# Patient Record
Sex: Female | Born: 1960 | Race: White | Hispanic: No | Smoking: Never smoker
Health system: Southern US, Community
[De-identification: ages and names within clinical notes are randomized; demographics above are authoritative.]

## PROBLEM LIST (undated history)

## (undated) DIAGNOSIS — F32A Depression, unspecified: Secondary | ICD-10-CM

## (undated) DIAGNOSIS — J45909 Unspecified asthma, uncomplicated: Secondary | ICD-10-CM

## (undated) DIAGNOSIS — F329 Major depressive disorder, single episode, unspecified: Secondary | ICD-10-CM

## (undated) DIAGNOSIS — G43909 Migraine, unspecified, not intractable, without status migrainosus: Secondary | ICD-10-CM

## (undated) DIAGNOSIS — I10 Essential (primary) hypertension: Secondary | ICD-10-CM

## (undated) DIAGNOSIS — D649 Anemia, unspecified: Secondary | ICD-10-CM

## (undated) DIAGNOSIS — E119 Type 2 diabetes mellitus without complications: Secondary | ICD-10-CM

## (undated) DIAGNOSIS — E039 Hypothyroidism, unspecified: Secondary | ICD-10-CM

## (undated) DIAGNOSIS — R609 Edema, unspecified: Secondary | ICD-10-CM

## (undated) DIAGNOSIS — E785 Hyperlipidemia, unspecified: Secondary | ICD-10-CM

## (undated) DIAGNOSIS — R519 Headache, unspecified: Secondary | ICD-10-CM

## (undated) DIAGNOSIS — F419 Anxiety disorder, unspecified: Secondary | ICD-10-CM

## (undated) DIAGNOSIS — E559 Vitamin D deficiency, unspecified: Secondary | ICD-10-CM

## (undated) DIAGNOSIS — Z973 Presence of spectacles and contact lenses: Secondary | ICD-10-CM

## (undated) DIAGNOSIS — I779 Disorder of arteries and arterioles, unspecified: Secondary | ICD-10-CM

## (undated) DIAGNOSIS — K589 Irritable bowel syndrome without diarrhea: Secondary | ICD-10-CM

## (undated) DIAGNOSIS — K219 Gastro-esophageal reflux disease without esophagitis: Secondary | ICD-10-CM

## (undated) DIAGNOSIS — R51 Headache: Secondary | ICD-10-CM

## (undated) DIAGNOSIS — N183 Chronic kidney disease, stage 3 unspecified: Secondary | ICD-10-CM

## (undated) DIAGNOSIS — H269 Unspecified cataract: Secondary | ICD-10-CM

## (undated) DIAGNOSIS — E079 Disorder of thyroid, unspecified: Secondary | ICD-10-CM

## (undated) DIAGNOSIS — I2510 Atherosclerotic heart disease of native coronary artery without angina pectoris: Secondary | ICD-10-CM

## (undated) HISTORY — DX: Anemia, unspecified: D64.9

## (undated) HISTORY — DX: Headache, unspecified: R51.9

## (undated) HISTORY — DX: Vitamin D deficiency, unspecified: E55.9

## (undated) HISTORY — DX: Edema, unspecified: R60.9

## (undated) HISTORY — PX: CHOLECYSTECTOMY: SHX55

## (undated) HISTORY — DX: Migraine, unspecified, not intractable, without status migrainosus: G43.909

## (undated) HISTORY — DX: Disorder of thyroid, unspecified: E07.9

## (undated) HISTORY — DX: Chronic kidney disease, stage 3 unspecified: N18.30

## (undated) HISTORY — PX: WISDOM TOOTH EXTRACTION: SHX21

## (undated) HISTORY — DX: Headache: R51

## (undated) HISTORY — DX: Irritable bowel syndrome, unspecified: K58.9

## (undated) HISTORY — DX: Gastro-esophageal reflux disease without esophagitis: K21.9

## (undated) HISTORY — DX: Hyperlipidemia, unspecified: E78.5

## (undated) HISTORY — PX: APPENDECTOMY: SHX54

## (undated) HISTORY — PX: GALLBLADDER SURGERY: SHX652

## (undated) HISTORY — DX: Type 2 diabetes mellitus without complications: E11.9

## (undated) HISTORY — DX: Unspecified asthma, uncomplicated: J45.909

---
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
## 1999-10-27 ENCOUNTER — Other Ambulatory Visit: Admission: RE | Admit: 1999-10-27 | Discharge: 1999-10-27 | Payer: Self-pay | Admitting: *Deleted

## 2014-07-11 ENCOUNTER — Ambulatory Visit (INDEPENDENT_AMBULATORY_CARE_PROVIDER_SITE_OTHER): Payer: BC Managed Care – PPO

## 2014-07-11 VITALS — BP 132/72 | HR 95 | Resp 18

## 2014-07-11 DIAGNOSIS — E1140 Type 2 diabetes mellitus with diabetic neuropathy, unspecified: Secondary | ICD-10-CM | POA: Insufficient documentation

## 2014-07-11 DIAGNOSIS — M2010 Hallux valgus (acquired), unspecified foot: Secondary | ICD-10-CM

## 2014-07-11 DIAGNOSIS — R52 Pain, unspecified: Secondary | ICD-10-CM

## 2014-07-11 DIAGNOSIS — G5763 Lesion of plantar nerve, bilateral lower limbs: Secondary | ICD-10-CM

## 2014-07-11 DIAGNOSIS — J45909 Unspecified asthma, uncomplicated: Secondary | ICD-10-CM | POA: Insufficient documentation

## 2014-07-11 DIAGNOSIS — M2040 Other hammer toe(s) (acquired), unspecified foot: Secondary | ICD-10-CM

## 2014-07-11 DIAGNOSIS — E039 Hypothyroidism, unspecified: Secondary | ICD-10-CM | POA: Insufficient documentation

## 2014-07-11 DIAGNOSIS — M722 Plantar fascial fibromatosis: Secondary | ICD-10-CM

## 2014-07-11 DIAGNOSIS — G5760 Lesion of plantar nerve, unspecified lower limb: Secondary | ICD-10-CM

## 2014-07-11 DIAGNOSIS — K589 Irritable bowel syndrome without diarrhea: Secondary | ICD-10-CM | POA: Insufficient documentation

## 2014-07-11 MED ORDER — MELOXICAM 15 MG PO TABS
15.0000 mg | ORAL_TABLET | Freq: Every day | ORAL | Status: DC
Start: 2014-07-11 — End: 2018-06-21

## 2014-07-11 NOTE — Progress Notes (Signed)
   Subjective:    Patient ID: Rebecca Roth, female    DOB: [birth year], 53 y.o.   MRN: [id number]  HPI my feet have been bothering me for my whole life but recently in the last 2 months and my ankles do swell some and my toes are painful on both feet and the right heel and has been going on for about 6 weeks and hardly could walk and touchy if I am sleeping on my back     Review of Systems  Constitutional: Positive for appetite change.  Eyes:       CORRECTIVE LENS  Gastrointestinal: Positive for nausea and diarrhea.  Endocrine: Positive for heat intolerance.  Musculoskeletal: Positive for gait problem.       JOINT PAIN  Allergic/Immunologic: Positive for environmental allergies.  Neurological: Positive for tremors and headaches.  All other systems reviewed and are negative.      Objective:   Physical Exam 53 year old white female well-developed well-nourished oriented x3 presents at this time with several complaints has had foot pain for many years and digital contractures and bunion deformities bilateral this is associated with difficulty with enclosed shoes walking activities has diffuse pain in the forefoot bilateral mid arch area left more so than right and calcaneal tubercle area right more so than left. The heel and arch pain is consistent almost all the time starts in the morning or after periods of rest the extensor out the day has to walk and stand on concrete most the time currently wearing athletic or walking shoe or worse a variety shoes as tolerated. Next  J objective findings as follows vascular status is intact with pedal pulses palpable DP and PT +2/4 bilateral capillary refill time 3 seconds all digits epicritic and proprioceptive sensations intact bilateral slight loss of sensation to distal greater toe. There is normal plantar response DTRs not elicited. Dermatologically skin color pigment normal hair growth present diminished distally nails unremarkable orthopedic  biomechanical exam rectus foot type with notable HAV deformity left more so than right lateral deviation of great toes bilateral with overlapping second digit left more so than right there semirigid digital contractures 234 and 5 with adductovarus rotation is second left being dorsally displaced at the MTP joint there is also pain direct lateral compression second MTP and second interspace bilateral posse with early neuroma symptomology or capsulitis of the second MTP area. No open wounds ulcerations no signs of infection no history of injury or trauma.       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 2 through 5 bilateral secondary severewithpossiblecapsulitissecondMTPjointandpossiblyMorton'sneuromasecondbilateral.Thereisalsoplantarfascialsymptomologybilateralrightmoresothanleftrightinthecalcanealtuberclearealeftmidarcharea.Withmultipleissuesandconcernsthistimewe'llinitiateconservativecareplantarfasciatreatmentwithfascialstrappingisappliedtobothfeetatthistime.AprescriptionformeloxicamorMOBIC15mg oncedailyisissueddiscontinuedAGFcenterintolerancepatientwillalsoapplyicetotheheelareasasinstructedmaintainaccommodativeshoeatsomepointinthefuturedispensedliteratureaboutneuromaalsoliteratureaboutbunionandhammertoesurgicaloptionsandconservativecare.Iftheforefootpaininneuromasupplytoimproveconsidersteroidinjectionortrytoavoidthesetoherdiabeteshistory.Patientwewillreevaluatein2-3weeksassessforpossibleuseoforthoticsbasedonimprovementwithafascialstrappinghertapingandNSAIDsalsofurtherdiscussionsofsurgicaloptions when ready.  Alvan Dameichard Kassity Woodson DPM

## 2014-07-11 NOTE — Patient Instructions (Signed)
ICE INSTRUCTIONS  Apply ice or cold pack to the affected area at least 3 times a day for 10-15 minutes each time.  You should also use ice after prolonged activity or vigorous exercise.  Do not apply ice longer than 20 minutes at one time.  Always keep a cloth between your skin and the ice pack to prevent burns.  Being consistent and following these instructions will help control your symptoms.  We suggest you purchase a gel ice pack because they are reusable and do bit leak.  Some of them are designed to wrap around the area.  Use the method that works best for you.  Here are some other suggestions for icing.   Use a frozen bag of peas or corn-inexpensive and molds well to your body, usually stays frozen for 10 to 20 minutes.  Wet a towel with cold water and squeeze out the excess until it's damp.  Place in a bag in the freezer for 20 minutes. Then remove and use.   Plantar Fasciitis Plantar fasciitis is a common condition that causes foot pain. It is soreness (inflammation) of the band of tough fibrous tissue on the bottom of the foot that runs from the heel bone (calcaneus) to the ball of the foot. The cause of this soreness may be from excessive standing, poor fitting shoes, running on hard surfaces, being overweight, having an abnormal walk, or overuse (this is common in runners) of the painful foot or feet. It is also common in aerobic exercise dancers and ballet dancers. SYMPTOMS  Most people with plantar fasciitis complain of:  Severe pain in the morning on the bottom of their foot especially when taking the first steps out of bed. This pain recedes after a few minutes of walking.  Severe pain is experienced also during walking following a long period of inactivity.  Pain is worse when walking barefoot or up stairs DIAGNOSIS   Your caregiver will diagnose this condition by examining and feeling your foot.  Special tests such as X-rays of your foot, are usually not needed. PREVENTION     Consult a sports medicine professional before beginning a new exercise program.  Walking programs offer a good workout. With walking there is a lower chance of overuse injuries common to runners. There is less impact and less jarring of the joints.  Begin all new exercise programs slowly. If problems or pain develop, decrease the amount of time or distance until you are at a comfortable level.  Wear good shoes and replace them regularly.  Stretch your foot and the heel cords at the back of the ankle (Achilles tendon) both before and after exercise.  Run or exercise on even surfaces that are not hard. For example, asphalt is better than pavement.  Do not run barefoot on hard surfaces.  If using a treadmill, vary the incline.  Do not continue to workout if you have foot or joint problems. Seek professional help if they do not improve. HOME CARE INSTRUCTIONS   Avoid activities that cause you pain until you recover.  Use ice or cold packs on the problem or painful areas after working out.  Only take over-the-counter or prescription medicines for pain, discomfort, or fever as directed by your caregiver.  Soft shoe inserts or athletic shoes with air or gel sole cushions may be helpful.  If problems continue or become more severe, consult a sports medicine caregiver or your own health care provider. Cortisone is a potent anti-inflammatory medication that may   be injected into the painful area. You can discuss this treatment with your caregiver. MAKE SURE YOU:   Understand these instructions.  Will watch your condition.  Will get help right away if you are not doing well or get worse. Document Released: 09/08/2001 Document Revised: 03/07/2012 Document Reviewed: 11/07/2008 Exit[employer] Patient Information 2015 [employer]. This information is not intended to replace advice given to you by your health care provider. Make sure you discuss any questions you have with your health care  provider.  

## 2014-08-01 ENCOUNTER — Ambulatory Visit: Payer: BC Managed Care – PPO

## 2014-08-22 ENCOUNTER — Ambulatory Visit: Payer: BC Managed Care – PPO

## 2015-12-03 ENCOUNTER — Ambulatory Visit: Payer: BLUE CROSS/BLUE SHIELD | Admitting: Neurology

## 2015-12-03 ENCOUNTER — Telehealth: Payer: Self-pay | Admitting: *Deleted

## 2015-12-03 NOTE — Telephone Encounter (Signed)
Called morning of appt to cancel.

## 2018-03-24 ENCOUNTER — Encounter: Payer: Self-pay | Admitting: Sports Medicine

## 2018-03-24 ENCOUNTER — Other Ambulatory Visit: Payer: Self-pay | Admitting: Sports Medicine

## 2018-03-24 ENCOUNTER — Ambulatory Visit: Payer: BLUE CROSS/BLUE SHIELD | Admitting: Sports Medicine

## 2018-03-24 ENCOUNTER — Ambulatory Visit (INDEPENDENT_AMBULATORY_CARE_PROVIDER_SITE_OTHER): Payer: BLUE CROSS/BLUE SHIELD

## 2018-03-24 DIAGNOSIS — L84 Corns and callosities: Secondary | ICD-10-CM

## 2018-03-24 DIAGNOSIS — M2010 Hallux valgus (acquired), unspecified foot: Secondary | ICD-10-CM

## 2018-03-24 DIAGNOSIS — M2042 Other hammer toe(s) (acquired), left foot: Secondary | ICD-10-CM

## 2018-03-24 DIAGNOSIS — E1140 Type 2 diabetes mellitus with diabetic neuropathy, unspecified: Secondary | ICD-10-CM

## 2018-03-24 DIAGNOSIS — M79672 Pain in left foot: Secondary | ICD-10-CM | POA: Diagnosis not present

## 2018-03-24 DIAGNOSIS — M79671 Pain in right foot: Secondary | ICD-10-CM

## 2018-03-24 DIAGNOSIS — E119 Type 2 diabetes mellitus without complications: Secondary | ICD-10-CM

## 2018-03-24 DIAGNOSIS — M2041 Other hammer toe(s) (acquired), right foot: Secondary | ICD-10-CM | POA: Diagnosis not present

## 2018-03-24 NOTE — Patient Instructions (Signed)
Hammer Toe Hammer toe is a change in the shape (a deformity) of your second, third, or fourth toe. The deformity causes the middle joint of your toe to stay bent. This causes pain, especially when you are wearing shoes. Hammer toe starts gradually. At first, the toe can be straightened. Gradually over time, the deformity becomes stiff and permanent. Early treatments to keep the toe straight may relieve pain. As the deformity becomes stiff and permanent, surgery may be needed to straighten the toe. What are the causes? Hammer toe is caused by abnormal bending of the toe joint that is closest to your foot. It happens gradually over time. This pulls on the muscles and connections (tendons) of the toe joint, making them weak and stiff. It is often related to wearing shoes that are too short or narrow and do not let your toes straighten. What increases the risk? You may be at greater risk for hammer toe if you:  Are female.  Are older.  Wear shoes that are too small.  Wear high-heeled shoes that pinch your toes.  Are a ballet dancer.  Have a second toe that is longer than your big toe (first toe).  Injure your foot or toe.  Have arthritis.  Have a family history of hammer toe.  Have a nerve or muscle disorder.  What are the signs or symptoms? The main symptoms of this condition are pain and deformity of the toe. The pain is worse when wearing shoes, walking, or running. Other symptoms may include:  Corns or calluses over the bent part of the toe or between the toes.  Redness and a burning feeling on the toe.  An open sore that forms on the top of the toe.  Not being able to straighten the toe.  How is this diagnosed? This condition is diagnosed based on your symptoms and a physical exam. During the exam, your health care provider will try to straighten your toe to see how stiff the deformity is. You may also have tests, such as:  A blood test to check for rheumatoid  arthritis.  An X-ray to show how severe the deformity is.  How is this treated? Treatment for this condition will depend on how stiff the deformity is. Surgery is often needed. However, sometimes a hammer toe can be straightened without surgery. Treatments that do not involve surgery include:  Taping the toe into a straightened position.  Using pads and cushions to protect the toe (orthotics).  Wearing shoes that provide enough room for the toes.  Doing toe-stretching exercises at home.  Taking an NSAID to reduce pain and swelling.  If these treatments do not help or the toe cannot be straightened, surgery is the next option. The most common surgeries used to straighten a hammer toe include:  Arthroplasty. In this procedure, part of the joint is removed, and that allows the toe to straighten.  Fusion. In this procedure, cartilage between the two bones of the joint is taken out and the bones are fused together into one longer bone.  Implantation. In this procedure, part of the bone is removed and replaced with an implant to let the toe move again.  Flexor tendon transfer. In this procedure, the tendons that curl the toes down (flexor tendons) are repositioned.  Follow these instructions at home:  Take over-the-counter and prescription medicines only as told by your health care provider.  Do toe straightening and stretching exercises as told by your health care provider.  Keep all   follow-up visits as told by your health care provider. This is important. How is this prevented?  Wear shoes that give your toes enough room and do not cause pain.  Do not wear high-heeled shoes. Contact a health care provider if:  Your pain gets worse.  Your toe becomes red or swollen.  You develop an open sore on your toe. This information is not intended to replace advice given to you by your health care provider. Make sure you discuss any questions you have with your health care  provider. Document Released: 12/11/2000 Document Revised: 07/03/2016 Document Reviewed: 04/08/2016 Elsevier Interactive Patient Education  2018 [employer] Bunion A bunion is a bump on the base of the big toe that forms when the bones of the big toe joint move out of position. Bunions may be small at first, but they often get larger over time. The can make walking painful. What are the causes? A bunion may be caused by:  Wearing narrow or pointed shoes that force the big toe to press against the other toes.  Abnormal foot development that causes the foot to roll inward (pronate).  Changes in the foot that are caused by certain diseases, such as rheumatoid arthritis and polio.  A foot injury.  What increases the risk? The following factors may make you more likely to develop this condition:  Wearing shoes that squeeze the toes together.  Having certain diseases, such as: ? Rheumatoid arthritis. ? Polio. ? Cerebral palsy.  Having family members who have bunions.  Being born with a foot deformity, such as flat feet or low arches.  Doing activities that put a lot of pressure on the feet, such as ballet dancing.  What are the signs or symptoms? The main symptom of a bunion is a noticeable bump on the big toe. Other symptoms may include:  Pain.  Swelling around the big toe.  Redness and inflammation.  Thick or hardened skin on the big toe or between the toes.  Stiffness or loss of motion in the big toe.  Trouble with walking.  How is this diagnosed? A bunion may be diagnosed based on your symptoms, medical history, and activities. You may have tests, such as:  X-rays. These allow your health care provider to check the position of the bones in your foot and look for damage to your joint. They also help your health care provider to determine the severity of your bunion and the best way to treat it.  Joint aspiration. In this test, a sample of fluid is removed from the  toe joint. This test, which may be done if you are in a lot of pain, helps to rule out diseases that cause painful swelling of the joints, such as arthritis.  How is this treated? There is no cure for a bunion, but treatment can help to prevent a bunion from getting worse. Treatment depends on the severity of your symptoms. Your health care provider may recommend:  Wearing shoes that have a wide toe box.  Using bunion pads to cushion the affected area.  Taping your toes together to keep them in a normal position.  Placing a device inside your shoe (orthotics) to help reduce pressure on your toe joint.  Taking medicine to ease pain, inflammation, and swelling.  Applying heat or ice to the affected area.  Doing stretching exercises.  Surgery to remove scar tissue and move the toes back into their normal position. This treatment is rare.  Follow these instructions at   home:  Support your toe joint with proper footwear, shoe padding, or taping as told by your health care provider.  Take over-the-counter and prescription medicines only as told by your health care provider.  If directed, apply ice to the injured area: ? Put ice in a plastic bag. ? Place a towel between your skin and the bag. ? Leave the ice on for 20 minutes, 2-3 times per day.  If directed, apply heat to the affected area before you exercise. Use the heat source that your health care provider recommends, such as a moist heat pack or a heating pad. ? Place a towel between your skin and the heat source. ? Leave the heat on for 20-30 minutes. ? Remove the heat if your skin turns bright red. This is especially important if you are unable to feel pain, heat, or cold. You may have a greater risk of getting burned.  Do exercises as told by your health care provider.  Keep all follow-up visits as told by your health care provider. Contact a health care provider if:  Your symptoms get worse.  Your symptoms do not improve  in 2 weeks. Get help right away if:  You have severe pain and trouble with walking. This information is not intended to replace advice given to you by your health care provider. Make sure you discuss any questions you have with your health care provider. Document Released: 12/14/2005 Document Revised: 05/21/2016 Document Reviewed: 07/14/2015 Elsevier Interactive Patient Education  2018 [employer]  

## 2018-03-24 NOTE — Progress Notes (Signed)
Subjective: Rebecca Roth is a 57 y.o. female patient who presents to office for evaluation of left greater than right foot pain. Patient complains of progressive pain especially over the last year in the left bunion and second hammertoe greater than the right side states that she has noticed the toes curving over the years and has become more more painful is limited to certain shoes because of the rubbing especially at the left second toe states that she has tried toe cushions without any additional relief admits to a family history of bunions on her dad's side. Patient denies any other pedal complaints.   Patient is also diabetic with blood sugar today recorded at 122 and last A1c 7.2 was seen last by her primary care Dr. Sedalia Mutaox 2 days ago.  Review of Systems  Musculoskeletal: Positive for joint pain.  All other systems reviewed and are negative.    Patient Active Problem List   Diagnosis Date Noted  . Type 2 diabetes, controlled, with neuropathy (HCC) 07/11/2014  . Hypothyroidism 07/11/2014  . IBS (irritable bowel syndrome) 07/11/2014  . Asthma 07/11/2014    Current Outpatient Medications on File Prior to Visit  Medication Sig Dispense Refill  . BAYER CONTOUR TEST test strip     . fenofibrate 54 MG tablet     . FLUoxetine (PROZAC) 20 MG capsule     . lamoTRIgine (LAMICTAL) 100 MG tablet     . Lancet Devices (SIMPLE DIAGNOSTICS LANCING DEV) MISC     . LEVEMIR FLEXTOUCH 100 UNIT/ML Pen     . levothyroxine (SYNTHROID, LEVOTHROID) 150 MCG tablet     . lisinopril (PRINIVIL,ZESTRIL) 20 MG tablet     . meloxicam (MOBIC) 15 MG tablet Take 1 tablet (15 mg total) by mouth daily. 30 tablet 1  . metFORMIN (GLUCOPHAGE) 1000 MG tablet     . pantoprazole (PROTONIX) 40 MG tablet     . pravastatin (PRAVACHOL) 40 MG tablet     . Vitamin D, Ergocalciferol, (DRISDOL) 50000 UNITS CAPS capsule      No current facility-administered medications on file prior to visit.     Allergies  Allergen  Reactions  . Sulfa Antibiotics     Objective:  General: Alert and oriented x3 in no acute distress  Dermatology: Small hyperkeratotic lesion overlying first interspace bilateral. No open lesions bilateral lower extremities, no webspace macerations, no ecchymosis bilateral, all nails x 10 are well manicured.  Vascular: Dorsalis Pedis and Posterior Tibial pedal pulses 1/4, Capillary Fill Time 3 seconds, scant pedal hair growth bilateral, trace edema bilateral lower extremities, mild varicosities bilateral, temperature gradient within normal limits.  Neurology: Michaell CowingGross sensation intact via light touch bilateral.  Musculoskeletal: Left greater than right bunion deformity with crossover and second hammertoe with pes planus, mild tenderness with palpation at PIPJ and first interspace area of keratotic lesion. Strength within normal limits in all groups bilateral.   Gait: Unassisted, mildly antalgic.  Xrays  Right/Left Foot    Impression: Normal osseous mineralization there is significant bunion and hammertoe with crossover deformity noted, there is mild calcaneal inferior spurring, there is midtarsal breech suggestive of pes planus with superimposed arthritis there are no other acute findings.       Assessment and Plan: Problem List Items Addressed This Visit      Endocrine   Type 2 diabetes, controlled, with neuropathy (HCC)    Other Visit Diagnoses    Hav (hallux abducto valgus), unspecified laterality    -  Primary   Relevant Orders  DG Foot Complete Right   DG Foot Complete Left   Hammer toes of both feet       Foot pain, bilateral       Diabetes mellitus without complication (HCC)       Callus of foot          -Complete examination performed -Xrays reviewed -Discussed treatement options for bunion and hammertoes with interdigital callus -Dispensed toe cushions to use as instructed -Recommend good supportive shoes daily -Advised patient that if these fail to provide any  additional relief should consider surgical correction of bunion and hammertoe pending A1c is at or under 7 -Patient to return to office as needed or sooner if condition worsens.  Asencion Islam, DPM

## 2018-05-27 ENCOUNTER — Ambulatory Visit: Payer: BLUE CROSS/BLUE SHIELD | Admitting: Pulmonary Disease

## 2018-05-27 ENCOUNTER — Other Ambulatory Visit (INDEPENDENT_AMBULATORY_CARE_PROVIDER_SITE_OTHER): Payer: BLUE CROSS/BLUE SHIELD

## 2018-05-27 ENCOUNTER — Encounter: Payer: Self-pay | Admitting: Pulmonary Disease

## 2018-05-27 ENCOUNTER — Ambulatory Visit (INDEPENDENT_AMBULATORY_CARE_PROVIDER_SITE_OTHER)
Admission: RE | Admit: 2018-05-27 | Discharge: 2018-05-27 | Disposition: A | Discharge status: Home / Self Care | Payer: BLUE CROSS/BLUE SHIELD | Source: Ambulatory Visit | Attending: Pulmonary Disease | Admitting: Pulmonary Disease

## 2018-05-27 VITALS — BP 142/80 | HR 89 | Ht 66.0 in | Wt 242.6 lb

## 2018-05-27 DIAGNOSIS — J4540 Moderate persistent asthma, uncomplicated: Secondary | ICD-10-CM

## 2018-05-27 LAB — CBC WITH DIFFERENTIAL/PLATELET
BASOS PCT: 1 % (ref 0.0–3.0)
Basophils Absolute: 0.1 10*3/uL (ref 0.0–0.1)
EOS ABS: 0.1 10*3/uL (ref 0.0–0.7)
EOS PCT: 1 % (ref 0.0–5.0)
HCT: 35.7 % — ABNORMAL LOW (ref 36.0–46.0)
Hemoglobin: 11.4 g/dL — ABNORMAL LOW (ref 12.0–15.0)
LYMPHS ABS: 2.2 10*3/uL (ref 0.7–4.0)
Lymphocytes Relative: 15.9 % (ref 12.0–46.0)
MCHC: 31.9 g/dL (ref 30.0–36.0)
MCV: 79.9 fl (ref 78.0–100.0)
Monocytes Absolute: 1.1 10*3/uL — ABNORMAL HIGH (ref 0.1–1.0)
Monocytes Relative: 8.3 % (ref 3.0–12.0)
NEUTROS PCT: 73.8 % (ref 43.0–77.0)
Neutro Abs: 10.1 10*3/uL — ABNORMAL HIGH (ref 1.4–7.7)
PLATELETS: 412 10*3/uL — AB (ref 150.0–400.0)
RBC: 4.47 Mil/uL (ref 3.87–5.11)
RDW: 16.6 % — AB (ref 11.5–15.5)
WBC: 13.7 10*3/uL — ABNORMAL HIGH (ref 4.0–10.5)

## 2018-05-27 LAB — NITRIC OXIDE: NITRIC OXIDE: 7

## 2018-05-27 MED ORDER — AZELASTINE HCL 0.1 % NA SOLN: 1.0000 | Freq: Two times a day (BID) | NASAL | 6 refills | Status: DC

## 2018-05-27 MED ORDER — PREDNISONE 10 MG PO TABS: ORAL_TABLET | ORAL | 0 refills | Status: DC

## 2018-05-27 MED ORDER — OMEPRAZOLE 40 MG PO CPDR: 40.0000 mg | DELAYED_RELEASE_CAPSULE | Freq: Two times a day (BID) | ORAL | 1 refills | Status: DC

## 2018-05-27 MED ORDER — CHLORPHENIRAMINE MALEATE 4 MG PO TABS: 4.0000 mg | ORAL_TABLET | Freq: Three times a day (TID) | ORAL | 1 refills | Status: DC

## 2018-05-27 MED ORDER — FLUTICASONE PROPIONATE 50 MCG/ACT NA SUSP: 2.0000 | Freq: Every day | NASAL | 2 refills | Status: DC

## 2018-05-27 MED ORDER — HYDROCOD POLST-CPM POLST ER 10-8 MG/5ML PO SUER: 5.0000 mL | Freq: Three times a day (TID) | ORAL | 0 refills | Status: DC | PRN

## 2018-05-27 NOTE — Patient Instructions (Signed)
Check CBC differential, blood allergy profile, chest x-ray today Continue the Breo We will give another short prednisone taper starting at 40 mg.  Reduce dose by 10 mg every 3 days Continue Singulair and Breo We will start you on chlorpheniramine 8 mg 3 times daily, Flonase and Astelin nasal spray Increase Prilosec to 40 mg twice daily Will prescribe another cough medication called Tussionex to suppress the cough. Follow-up in 2 to 4 weeks.

## 2018-05-27 NOTE — Progress Notes (Signed)
Rebecca Roth    [id number]    1961/04/16  Primary Care Physician:Cox, Fritzi Mandes, MD  Referring Physician: Blane Ohara, MD [address] [place], [place] [address]  Chief complaint: Chronic cough  HPI: 57 year old with history of asthma, lipidemia, seasonal allergies Complains of persistent cough for the past 2 weeks.  Associated with dyspnea.  She had been treated with her primary care with prednisone, clarithromycin with minor improvement in symptoms.  She has history of asthma and is maintained on Breo.  She is using a pro-air 4 times daily  She has GERD and occasional difficulty swallowing, choking on food.  She is on Protonix 40 mg a day.  History of seasonal allergies and has increasing symptoms of postnasal drip for the past few weeks  Pets: No birds, farm animals Occupation: Environmental health practitioner Exposures: No known exposures, no mold, hot tub no down pillows, comforters Smoking history: Never smoker Travel history: Grew up in [place].  No other significant travel Relevant family history: No relevant family history of lung issues.  Outpatient Encounter Medications as of 05/27/2018  Medication Sig  . BAYER CONTOUR TEST test strip   . BREO ELLIPTA 200-25 MCG/INH AEPB   . fenofibrate 54 MG tablet   . FLUoxetine (PROZAC) 20 MG capsule   . HYDROcodone-homatropine (HYCODAN) 5-1.5 MG/5ML syrup   . lamoTRIgine (LAMICTAL) 100 MG tablet   . Lancet Devices (SIMPLE DIAGNOSTICS LANCING DEV) MISC   . LEVEMIR FLEXTOUCH 100 UNIT/ML Pen   . levothyroxine (SYNTHROID, LEVOTHROID) 150 MCG tablet   . lisinopril (PRINIVIL,ZESTRIL) 20 MG tablet   . meloxicam (MOBIC) 15 MG tablet Take 1 tablet (15 mg total) by mouth daily.  . metFORMIN (GLUCOPHAGE) 1000 MG tablet   . montelukast (SINGULAIR) 10 MG tablet   . pantoprazole (PROTONIX) 40 MG tablet   . pravastatin (PRAVACHOL) 40 MG tablet   . Vitamin D, Ergocalciferol, (DRISDOL) 50000 UNITS CAPS capsule    No  facility-administered encounter medications on file as of 05/27/2018.     Allergies as of 05/27/2018 - Review Complete 05/27/2018  Allergen Reaction Noted  . Sulfa antibiotics  07/11/2014    Past Medical History:  Diagnosis Date  . Anemia   . Asthma   . Diabetes mellitus without complication (HCC)   . Frequent headaches   . GERD (gastroesophageal reflux disease)   . IBS (irritable bowel syndrome)   . Swelling   . Thyroid disease     Past Surgical History:  Procedure Laterality Date  . GALLBLADDER SURGERY      Family History  Problem Relation Age of Onset  . Stroke Mother   . Pancreatic cancer Father   . Kidney cancer Brother   . Emphysema Maternal Grandfather     Social History   Socioeconomic History  . Marital status: Married    Spouse name: Not on file  . Number of children: Not on file  . Years of education: Not on file  . Highest education level: Not on file  Occupational History  . Not on file  Social Needs  . Financial resource strain: Not on file  . Food insecurity:    Worry: Not on file    Inability: Not on file  . Transportation needs:    Medical: Not on file    Non-medical: Not on file  Tobacco Use  . Smoking status: Never Smoker  . Smokeless tobacco: Never Used  Substance and Sexual Activity  . Alcohol use:  No  . Drug use: No  . Sexual activity: Not on file  Lifestyle  . Physical activity:    Days per week: Not on file    Minutes per session: Not on file  . Stress: Not on file  Relationships  . Social connections:    Talks on phone: Not on file    Gets together: Not on file    Attends religious service: Not on file    Active member of club or organization: Not on file    Attends meetings of clubs or organizations: Not on file    Relationship status: Not on file  . Intimate partner violence:    Fear of current or ex partner: Not on file    Emotionally abused: Not on file    Physically abused: Not on file    Forced sexual activity:  Not on file  Other Topics Concern  . Not on file  Social History Narrative  . Not on file   Review of systems: Review of Systems  Constitutional: Negative for fever and chills.  HENT: Negative.   Eyes: Negative for blurred vision.  Respiratory: as per HPI  Cardiovascular: Negative for chest pain and palpitations.  Gastrointestinal: Negative for vomiting, diarrhea, blood per rectum. Genitourinary: Negative for dysuria, urgency, frequency and hematuria.  Musculoskeletal: Negative for myalgias, back pain and joint pain.  Skin: Negative for itching and rash.  Neurological: Negative for dizziness, tremors, focal weakness, seizures and loss of consciousness.  Endo/Heme/Allergies: Negative for environmental allergies.  Psychiatric/Behavioral: Negative for depression, suicidal ideas and hallucinations.  All other systems reviewed and are negative.  Physical Exam: Blood pressure (!) 142/80, pulse 89, height  (1.676 m), weight 242 lb 9.6 oz (110 kg), SpO2 97 %. Gen:      No acute distress HEENT:  EOMI, sclera anicteric Neck:     No masses; no thyromegaly Lungs:    Scattered crackle, wheeze CV:         Regular rate and rhythm; no murmurs Abd:      + bowel sounds; soft, non-tender; no palpable masses, no distension Ext:    No edema; adequate peripheral perfusion Skin:      Warm and dry; no rash Neuro: alert and oriented x 3 Psych: normal mood and affect  Data Reviewed: FENO 05/27/2018- 7  Assessment:  Moderate persistent asthma with exacerbation Chronic cough Here with asthma exacerbation.  Suspect that upper airway cough, postnasal drip and GERD is contributing to symptoms She has been on lisinopril for many years but ACE inhibitor is likely making her cough harder to control.  History noted for multiple episodes of similar presentation with cough. She will discuss with primary care if she can come off the lisinopril to an alternative agent  We will get chest x-ray today, check  CBC differential, blood allergy profile Continue Breo, albuterol Give another prednisone taper, prescribe Tussionex Start chlorpheniramine 8 mg 3 times daily, Flonase, Astelin nasal spray for postnasal drip Increase Protonix to 40 mg twice daily  Plan/Recommendations: - Discuss with primary if she can come off ACEI - Chest x-ray, CBC, blood allergy profile - Prednisone taper starting at 40 mg.  Reduce dose by 10 mg every 3 days - Chlorpheniramine, Flonase, Astelin - Tussionex for cough - Increase Protonix to 40 mg twice daily - Continue Breo, albuterol  Chilton Greathouse MD [facility] 05/27/2018, 2:27 PM  CC: Blane Ohara, MD

## 2018-05-30 LAB — RESPIRATORY ALLERGY PROFILE REGION II ~~LOC~~
Allergen, Cottonwood, t14: <0.1 kU/L
Allergen, D pternoyssinus,d7: <0.1 kU/L
Bermuda Grass: <0.1 kU/L
Box Elder IgE: <0.1 kU/L
CLADOSPORIUM HERBARUM (M2) IGE: <0.1 kU/L
CLASS: 0
CLASS: 0
CLASS: 0
CLASS: 0
CLASS: 0
CLASS: 0
CLASS: 0
CLASS: 0
CLASS: 0
COMMON RAGWEED (SHORT) (W1) IGE: <0.1 kU/L
Class: 0
Class: 0
Class: 0
Class: 0
Class: 0
Class: 0
Class: 0
Class: 0
Class: 0
Class: 0
Class: 0
Class: 0
Class: 0
Class: 0
Class: 0
Cockroach: <0.1 kU/L
Dog Dander: <0.1 kU/L
Elm IgE: <0.1 kU/L
IgE (Immunoglobulin E), Serum: 2 kU/L (ref <=114)
Johnson Grass: <0.1 kU/L
Sheep Sorrel IgE: <0.1 kU/L

## 2018-05-30 LAB — INTERPRETATION:

## 2018-06-16 ENCOUNTER — Telehealth: Payer: Self-pay | Admitting: Pulmonary Disease

## 2018-06-16 NOTE — Telephone Encounter (Signed)
Spoke with pt. She is requesting lab work results from 05/27/18.  Dr. Isaiah SergeMannam - please advise. Thanks.

## 2018-06-16 NOTE — Telephone Encounter (Signed)
Called and spoke with the pt letting her know the results of the labwork.  Pt states her symptoms are much better than they were.  Asked pt if she had a f/u appt at our office and she said she did but she cancelled it due to having a conflict with her schedule.  Pt stated she will go ahead and come in on 6/25 at 3:45 which was her original appt and just move things around on her schedule.  Pt's appt has been remade. Nothing further needed.

## 2018-06-16 NOTE — Telephone Encounter (Addendum)
Tests show mild elevation in WBC count of unclear reason and hemoglobin is slightly decreased.  Allergy test is negative Check if her symptoms are better? She is supposed to have a clinic follow-up.  Please check if this has been scheduled?

## 2018-06-21 ENCOUNTER — Encounter: Payer: Self-pay | Admitting: Pulmonary Disease

## 2018-06-21 ENCOUNTER — Ambulatory Visit: Payer: BLUE CROSS/BLUE SHIELD | Admitting: Pulmonary Disease

## 2018-06-21 VITALS — BP 138/78 | HR 103 | Ht 66.0 in | Wt 245.2 lb

## 2018-06-21 DIAGNOSIS — J4540 Moderate persistent asthma, uncomplicated: Secondary | ICD-10-CM

## 2018-06-21 NOTE — Progress Notes (Signed)
Rebecca Roth    [id number]    Sep 04, 1961  Primary Care Physician:Cox, Fritzi Mandes, MD  Referring Physician: Blane Ohara, MD [address] [place], [place] [address]  Chief complaint:  Follow-up for chronic cough, ACEI induced GERD Asthma  HPI: 57 year old with history of asthma, lipidemia, seasonal allergies Complains of persistent cough for the past 2 weeks.  Associated with dyspnea.  She had been treated with her primary care with prednisone, clarithromycin with minor improvement in symptoms.  She has history of asthma and is maintained on Breo.  She is using a pro-air 4 times daily  She has GERD and occasional difficulty swallowing, choking on food.  She is on Protonix 40 mg a day.  History of seasonal allergies and has increasing symptoms of postnasal drip for the past few weeks  Pets: No birds, farm animals Occupation: Environmental health practitioner Exposures: No known exposures, no mold, hot tub no down pillows, comforters Smoking history: Never smoker Travel history: Grew up in [place].  No other significant travel Relevant family history: No relevant family history of lung issues.   Interim history: Cough has resolved after lisinopril was held at last visit. She has not taken the chlorpheniramine.  She is using Flonase nasal spray and Protonix 40 mg twice daily States that her GERD symptoms have improved with increased regimen of PPI  Outpatient Encounter Medications as of 06/21/2018  Medication Sig  . BAYER CONTOUR TEST test strip   . BREO ELLIPTA 200-25 MCG/INH AEPB   . fenofibrate 54 MG tablet   . FLUoxetine (PROZAC) 20 MG capsule   . fluticasone (FLONASE) 50 MCG/ACT nasal spray Place 2 sprays into both nostrils daily.  Marland Kitchen lamoTRIgine (LAMICTAL) 100 MG tablet   . Lancet Devices (SIMPLE DIAGNOSTICS LANCING DEV) MISC   . LEVEMIR FLEXTOUCH 100 UNIT/ML Pen   . levothyroxine (SYNTHROID, LEVOTHROID) 150 MCG tablet   . metFORMIN (GLUCOPHAGE) 1000 MG  tablet   . montelukast (SINGULAIR) 10 MG tablet   . omeprazole (PRILOSEC) 40 MG capsule Take 1 capsule (40 mg total) by mouth 2 (two) times daily.  . pantoprazole (PROTONIX) 40 MG tablet   . pravastatin (PRAVACHOL) 40 MG tablet   . Vitamin D, Ergocalciferol, (DRISDOL) 50000 UNITS CAPS capsule   . [DISCONTINUED] azelastine (ASTELIN) 0.1 % nasal spray Place 1 spray into both nostrils 2 (two) times daily. Use in each nostril as directed  . lisinopril (PRINIVIL,ZESTRIL) 20 MG tablet   . [DISCONTINUED] chlorpheniramine (CHLOR-TRIMETON) 4 MG tablet Take 1 tablet (4 mg total) by mouth 3 (three) times daily.  . [DISCONTINUED] chlorpheniramine-HYDROcodone (TUSSIONEX PENNKINETIC ER) 10-8 MG/5ML SUER Take 5 mLs by mouth 3 (three) times daily as needed for cough.  . [DISCONTINUED] HYDROcodone-homatropine (HYCODAN) 5-1.5 MG/5ML syrup   . [DISCONTINUED] meloxicam (MOBIC) 15 MG tablet Take 1 tablet (15 mg total) by mouth daily.  . [DISCONTINUED] predniSONE (DELTASONE) 10 MG tablet 4 tabs x 3 days, 3 tabs x 3 days, 2 tabs x 3 days, 1 tab x 3 days then stop   No facility-administered encounter medications on file as of 06/21/2018.     Allergies as of 06/21/2018 - Review Complete 06/21/2018  Allergen Reaction Noted  . Sulfa antibiotics  07/11/2014    Past Medical History:  Diagnosis Date  . Anemia   . Asthma   . Diabetes mellitus without complication (HCC)   . Frequent headaches   . GERD (gastroesophageal reflux disease)   . IBS (irritable bowel  syndrome)   . Swelling   . Thyroid disease     Past Surgical History:  Procedure Laterality Date  . GALLBLADDER SURGERY      Family History  Problem Relation Age of Onset  . Stroke Mother   . Pancreatic cancer Father   . Kidney cancer Brother   . Emphysema Maternal Grandfather     Social History   Socioeconomic History  . Marital status: Married    Spouse name: Not on file  . Number of children: Not on file  . Years of education: Not on file   . Highest education level: Not on file  Occupational History  . Not on file  Social Needs  . Financial resource strain: Not on file  . Food insecurity:    Worry: Not on file    Inability: Not on file  . Transportation needs:    Medical: Not on file    Non-medical: Not on file  Tobacco Use  . Smoking status: Never Smoker  . Smokeless tobacco: Never Used  Substance and Sexual Activity  . Alcohol use: No  . Drug use: No  . Sexual activity: Not on file  Lifestyle  . Physical activity:    Days per week: Not on file    Minutes per session: Not on file  . Stress: Not on file  Relationships  . Social connections:    Talks on phone: Not on file    Gets together: Not on file    Attends religious service: Not on file    Active member of club or organization: Not on file    Attends meetings of clubs or organizations: Not on file    Relationship status: Not on file  . Intimate partner violence:    Fear of current or ex partner: Not on file    Emotionally abused: Not on file    Physically abused: Not on file    Forced sexual activity: Not on file  Other Topics Concern  . Not on file  Social History Narrative  . Not on file   Review of systems: Review of Systems  Constitutional: Negative for fever and chills.  HENT: Negative.   Eyes: Negative for blurred vision.  Respiratory: as per HPI  Cardiovascular: Negative for chest pain and palpitations.  Gastrointestinal: Negative for vomiting, diarrhea, blood per rectum. Genitourinary: Negative for dysuria, urgency, frequency and hematuria.  Musculoskeletal: Negative for myalgias, back pain and joint pain.  Skin: Negative for itching and rash.  Neurological: Negative for dizziness, tremors, focal weakness, seizures and loss of consciousness.  Endo/Heme/Allergies: Negative for environmental allergies.  Psychiatric/Behavioral: Negative for depression, suicidal ideas and hallucinations.  All other systems reviewed and are  negative.  Physical Exam: Blood pressure 138/78, pulse (!) 103, height 5\' 6"  (1.676 m), weight 245 lb 3.2 oz (111.2 kg), SpO2 96 %. Gen:      No acute distress HEENT:  EOMI, sclera anicteric Neck:     No masses; no thyromegaly Lungs:    Clear to auscultation bilaterally; normal respiratory effort CV:         Regular rate and rhythm; no murmurs Abd:      + bowel sounds; soft, non-tender; no palpable masses, no distension Ext:    No edema; adequate peripheral perfusion Skin:      Warm and dry; no rash Neuro: alert and oriented x 3 Psych: normal mood and affect  Data Reviewed: FENO 05/27/2018- 7  Chest x-ray 05/27/2018- no active cardiopulmonary disease.  I have  reviewed the images personally  Labs CBC 05/27/2018-WBC 13.7, eos 1%, absolute eosinophil count 137 Blood allergy profile 05/27/2018-IgE 2, RAST panel negative.  Assessment:  Moderate persistent asthma Stable on Breo, albuterol as needed.   PFTs ordered to assess lung function.  Chronic cough secondary to ACE inhibitor Improved after lisinopril was held.  She will follow-up with primary care to discuss alternative antihypertensive medication.  GERD Continue on Protonix 40 mg daily.  Plan/Recommendations: - Continue Breo, albuterol - PFTs - Continue Protonix.  Chilton GreathousePraveen Kasarah Sitts MD [facility] 06/21/2018, 3:26 PM  CC: Blane Oharaox, Kirsten, MD

## 2018-06-21 NOTE — Patient Instructions (Signed)
I am glad that your cough is improved Continue using the Protonix It is okay to cut back on the Flonase and start using it as needed I will follow up with you in 6 months with pulmonary function test.

## 2018-09-17 ENCOUNTER — Encounter: Payer: Self-pay | Admitting: *Deleted

## 2018-09-17 DIAGNOSIS — F331 Major depressive disorder, recurrent, moderate: Secondary | ICD-10-CM

## 2018-09-17 DIAGNOSIS — F411 Generalized anxiety disorder: Secondary | ICD-10-CM

## 2018-09-17 DIAGNOSIS — F422 Mixed obsessional thoughts and acts: Secondary | ICD-10-CM

## 2018-10-07 ENCOUNTER — Ambulatory Visit: Payer: BLUE CROSS/BLUE SHIELD | Admitting: Physician Assistant

## 2018-10-07 ENCOUNTER — Encounter: Payer: Self-pay | Admitting: Physician Assistant

## 2018-10-07 ENCOUNTER — Ambulatory Visit: Payer: BLUE CROSS/BLUE SHIELD | Admitting: Mental Health

## 2018-10-07 DIAGNOSIS — F422 Mixed obsessional thoughts and acts: Secondary | ICD-10-CM | POA: Diagnosis not present

## 2018-10-07 DIAGNOSIS — F411 Generalized anxiety disorder: Secondary | ICD-10-CM

## 2018-10-07 DIAGNOSIS — F331 Major depressive disorder, recurrent, moderate: Secondary | ICD-10-CM

## 2018-10-07 MED ORDER — LAMOTRIGINE 200 MG PO TABS: 200.0000 mg | ORAL_TABLET | Freq: Two times a day (BID) | ORAL | 3 refills | Status: DC

## 2018-10-07 MED ORDER — BREXPIPRAZOLE 2 MG PO TABS: 2.0000 mg | ORAL_TABLET | Freq: Every day | ORAL | 0 refills | Status: DC

## 2018-10-07 MED ORDER — FLUOXETINE HCL 40 MG PO CAPS: 80.0000 mg | ORAL_CAPSULE | Freq: Every day | ORAL | 3 refills | Status: DC

## 2018-10-07 NOTE — Progress Notes (Signed)
      [facility] Counselor/Therapist Progress Note   Patient ID: Rebecca Roth, MRN: [id number]  Date: 10/07/2018  Timespent: 60 minutes  Treatment Type: Individual  Subjective: Patient arrived on time for today's session and in no distress.  She shared how she has had a more stable mood over the past 2 weeks.  She stated that she is more hopeful and went on to discuss the relationship she has with her husband.  She stated to have a strong marriage and her highly supportive of one another.  However, she stated that he sometimes communicates with her in a manner which leaves her feeling less respected.  She stated that she feels it is his communication style that needs to change in those instances and is not a reflection necessarily of the person he has as she knows he is a caring supportive husband.  Ways to cope and express herself effectively with her husband when shared.  Patient plans to follow through with communication skills given.  Provided support throughout continue to work with patient from a strength-based approach.  Interventions:CBT, Solution Focused and Strength-based Mental Status Exam:   Appearance:   Casual   Behavior:  Appropriate  Motor:  Normal  Speech/Language:   Normal Rate  Affect:  Full Range  Mood:  anxious  Thought process:  Coherent and Relevant  Thought content:   Logical  Perceptual disturbances:   Normal  Orientation:  Full (Time, Place, and Person)  Attention:  Good  Concentration:  good  Memory:  Immediate  Fund of knowledge:   Good  Insight:   Good  Judgment:   Good  Impulse Control:  good    Reported Symptoms: Depressed mood, anxiety, hopelessness and helplessness, isolated behavior, deficits with attention and concentration, history of panic attacks  Risk Assessment: Danger to Self: No Self-injurious Behavior: No Danger to Others: No Duty to Warn:no Physical Aggression / Violence:No  Access to Firearms a  concern: No  Gang Involvement:No   Dx: F33.1   Plan:   1.  Patient to continue to engage in individual counseling 2-4 times a month or as needed. 2.  Patient to identify and apply CBT, coping skills learned in session to decrease depression and anxiety symptoms. 3.  Patient to contact this office, go to the local ED or call 911 if a crisis or emergency develops between visits.  Waldron Session, [facility]  Diagnosis:   ICD-10-CM   1. Major depressive disorder, recurrent episode, moderate (HCC) F33.1      Plan:  1.  Patient to continue to engage in individual counseling 2-4 times a month or as needed. 2.  Patient to identify and apply CBT, coping skills learned in session to decrease depression and anxiety symptoms. 3.  Patient to improve effective communication and assertiveness with others. 4.  Patient to contact this office, go to the local ED or call 911 if a crisis or emergency develops between visits.  Waldron Session, [facility]

## 2018-10-07 NOTE — Progress Notes (Signed)
[facility] Med Check  Patient ID: Rebecca Roth,  MRN: [id number]  PCP: Blane Ohara, MD  Date of Evaluation: 10/07/2018 Time spent:15 minutes   HISTORY/CURRENT STATUS: HPI Rebecca Roth presents for routine medication evaluation.  At the last visit a month ago, we had increased the Rexulti.  She states it is her favorite drug now and has made her feel so much better.  She denies anhedonia, decreased energy or motivation, she is not isolating, and not crying easily like she was.  She is sleeping well. He continues to work part-time and lives it. The anxiety is well controlled with current medications and she is only rarely taken the Klonopin. She saw Elio Forget, [facility] earlier this morning and had a good session.  She states he is helping her so much. See PHQ 2 9 and GAD on chart.  Individual Medical History/ Review of Systems: Changes? :No  Allergies: Ace inhibitors and Sulfa antibiotics  Current Medications:  Current Outpatient Medications:  .  BAYER CONTOUR TEST test strip, , Disp: , Rfl:  .  BREO ELLIPTA 200-25 MCG/INH AEPB, , Disp: , Rfl: 0 .  Brexpiprazole (REXULTI) 2 MG TABS, Take 2 mg by mouth daily., Disp: 90 tablet, Rfl: 0 .  busPIRone (BUSPAR) 30 MG tablet, Take 30 mg by mouth 2 (two) times daily., Disp: , Rfl:  .  clonazePAM (KLONOPIN) 0.5 MG tablet, Take 0.5 mg by mouth 2 (two) times daily as needed for anxiety., Disp: , Rfl:  .  fenofibrate 54 MG tablet, , Disp: , Rfl:  .  FLUoxetine (PROZAC) 40 MG capsule, Take 2 capsules (80 mg total) by mouth daily., Disp: 180 capsule, Rfl: 3 .  fluticasone (FLONASE) 50 MCG/ACT nasal spray, Place 2 sprays into both nostrils daily., Disp: 16 g, Rfl: 2 .  lamoTRIgine (LAMICTAL) 200 MG tablet, Take 1 tablet (200 mg total) by mouth 2 (two) times daily., Disp: 180 tablet, Rfl: 3 .  Lancet Devices (SIMPLE DIAGNOSTICS LANCING DEV) MISC, , Disp: , Rfl:  .  LEVEMIR FLEXTOUCH 100 UNIT/ML Pen, , Disp: , Rfl:  .  levothyroxine (SYNTHROID,  LEVOTHROID) 150 MCG tablet, Take 137 mcg by mouth daily., Disp: , Rfl:  .  metFORMIN (GLUCOPHAGE) 1000 MG tablet, , Disp: , Rfl:  .  montelukast (SINGULAIR) 10 MG tablet, , Disp: , Rfl: 4 .  omeprazole (PRILOSEC) 40 MG capsule, Take 1 capsule (40 mg total) by mouth 2 (two) times daily., Disp: 60 capsule, Rfl: 1 .  pantoprazole (PROTONIX) 40 MG tablet, , Disp: , Rfl:  .  pravastatin (PRAVACHOL) 40 MG tablet, , Disp: , Rfl:  .  Vitamin D, Ergocalciferol, (DRISDOL) 50000 UNITS CAPS capsule, , Disp: , Rfl:  Medication Side Effects: None  Family Medical/ Social History: Changes? No  MENTAL HEALTH EXAM:  There were no vitals taken for this visit.There is no height or weight on file to calculate BMI.  General Appearance: Well Groomed  Eye Contact:  Good  Speech:  Clear and Coherent  Volume:  Normal  Mood:  Euthymic  Affect:  Appropriate  Thought Process:  Goal Directed  Orientation:  Full (Time, Place, and Person)  Thought Content: Logical   Suicidal Thoughts:  No  Homicidal Thoughts:  No  Memory:  Immediate  Judgement:  Good  Insight:  Good  Psychomotor Activity:  Normal  Concentration:  Concentration: Good  Recall:  Good  Fund of Knowledge: Good  Language: Good  Akathisia:  No  AIMS (if indicated): not done  Assets:  Communication  Skills  ADL's:  Intact  Cognition: WNL  Prognosis:  Good    DIAGNOSES:    ICD-10-CM   1. Major depressive disorder, recurrent episode, moderate (HCC) F33.1   2. Generalized anxiety disorder F41.1   3. Mixed obsessional thoughts and acts F42.2     RECOMMENDATIONS: Continue Rexulti 2 mg daily, Lamictal 200 mg 1 twice daily, Prozac 40 mg 2 daily, BuSpar 30 mg 1 twice daily, Klonopin 0.5 mg half to 1 twice daily as needed which she takes rarely. Continue therapy with Elio Forget, LPC. Return in 6 weeks or sooner as needed.   Melony Overly, PA-C

## 2018-10-14 ENCOUNTER — Other Ambulatory Visit: Payer: Self-pay | Admitting: Pulmonary Disease

## 2018-10-21 ENCOUNTER — Ambulatory Visit: Payer: Self-pay | Admitting: Mental Health

## 2018-11-04 ENCOUNTER — Ambulatory Visit: Payer: BLUE CROSS/BLUE SHIELD | Admitting: Mental Health

## 2018-11-04 DIAGNOSIS — F411 Generalized anxiety disorder: Secondary | ICD-10-CM | POA: Diagnosis not present

## 2018-11-04 DIAGNOSIS — F331 Major depressive disorder, recurrent, moderate: Secondary | ICD-10-CM | POA: Diagnosis not present
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
## 2018-11-11 ENCOUNTER — Encounter: Payer: Self-pay | Admitting: Physician Assistant

## 2018-11-11 ENCOUNTER — Ambulatory Visit: Payer: BLUE CROSS/BLUE SHIELD | Admitting: Mental Health

## 2018-11-11 ENCOUNTER — Ambulatory Visit (INDEPENDENT_AMBULATORY_CARE_PROVIDER_SITE_OTHER): Payer: BLUE CROSS/BLUE SHIELD | Admitting: Physician Assistant

## 2018-11-11 DIAGNOSIS — F422 Mixed obsessional thoughts and acts: Secondary | ICD-10-CM

## 2018-11-11 DIAGNOSIS — F331 Major depressive disorder, recurrent, moderate: Secondary | ICD-10-CM

## 2018-11-11 DIAGNOSIS — F411 Generalized anxiety disorder: Secondary | ICD-10-CM

## 2018-11-11 NOTE — Progress Notes (Signed)
[facility] Counselor/Therapist Progress Note   Patient ID: Rebecca Roth, MRN: [id number]  Date: 11/11/2018  Timespent: 61 minutes  Treatment Type: Individual  Mental Status Exam:   Appearance:   Casual   Behavior:  Appropriate  Motor:  Normal  Speech/Language:   Normal Rate  Affect:  Full Range  Mood:  Depressed, tearful  Thought process:  Coherent and Relevant  Thought content:   Logical  Perceptual disturbances:   Normal  Orientation:  Full (Time, Place, and Person)  Attention:  Good  Concentration:  good  Memory:  Immediate  Fund of knowledge:   Good  Insight:   Good  Judgment:   Good  Impulse Control:  good    Reported Symptoms: Depressed mood, anxiety, hopelessness and helplessness, isolated behavior, deficits with attention and concentration, history of panic attacks  Risk Assessment: Danger to Self: No Self-injurious Behavior: No Danger to Others: No Duty to Warn:no Physical Aggression / Violence:No  Access to Firearms a concern: No  Gang Involvement:No  Patient / guardian was educated about steps to take if suicide or homicide risk level increases between visits. While future psychiatric events cannot be accurately predicted, the patient does not currently require acute inpatient psychiatric care and does not currently meet [facility] involuntary commitment criteria.  Subjective: Patient arrived on time for today's session and in no distress.  We discussed progress.  She stated that she had a recent work situation, where she had to cope with a considerable amount of stress.  She stated that she unwittingly broke up company policy and had to discuss with her supervisor.  She stated that her job is intact, that her employer was understanding.  Provided support as she processed the situation and feelings.  She discussed how she was able to set some boundaries with her husband.  She can stated they continue to have a very strong,  committed relationship, but she set some boundaries and he was able to acknowledge her intentions.  She stated that he shared care and concern by following through with her wishes and communicating more respectfully toward her.  She stated that she knows that he must probably was unaware of how he was sounding but was receptive to making changes.  She shared how she has plans with the family over the holidays.  She shared the current relationship she has with her daughter, they are very close.  She stated that her daughter recently asked her about her medications to make sure they were working effectively.  Patient stated that she asked this question due to her mood being more reserved.  She stated that her medications are effective and express some feelings related to how, although is caring is her daughter's intentions are, it feels to have her mental health issues a focus.  Provided support as she processed feelings, reminding her of her progress and the relationships that she continues to foster with love and support.  Interventions:CBT, Solution Focused and Strength-based   Waldron Session, LPC  Diagnosis:   ICD-10-CM   1. Generalized anxiety disorder F41.1      Plan:  1.  Patient to continue to engage in individual counseling 2-4 times a month or as needed. 2.  Patient to identify and apply CBT, coping skills learned in session to decrease depression and anxiety symptoms. 3.  Patient to improve effective communication and assertiveness with others. 4.  Patient to contact this office, go to the local ED or call 911 if a crisis  or emergency develops between visits.  Waldron Sessionhristopher Rebecca Roth, [facility]

## 2018-11-11 NOTE — Progress Notes (Signed)
[facility] Med Check  Patient ID: Rebecca Roth,  MRN: [id number]  PCP: Blane Ohara, MD  Date of Evaluation: 11/11/2018 Time spent:15 minutes  Chief Complaint:  Chief Complaint    Follow-up; Depression      HISTORY/CURRENT STATUS: HPI Here for routine med check.  Patient denies loss of interest in usual activities and is able to enjoy things.  Denies decreased energy or motivation.  Appetite has not changed.  No extreme sadness, tearfulness, or feelings of hopelessness.  Denies any changes in concentration, making decisions or remembering things.  Denies suicidal or homicidal thoughts. Every once in awhile, she'll have a day where she isn't up or down, but is okay with that.    Individual Medical History/ Review of Systems: Changes? :Yes selling her house, and she, her husband, and her dtr and family are moving in together.   Allergies: Ace inhibitors and Sulfa antibiotics  Current Medications:  Current Outpatient Medications:  .  atorvastatin (LIPITOR) 40 MG tablet, Take 40 mg by mouth daily., Disp: , Rfl:  .  BAYER CONTOUR TEST test strip, , Disp: , Rfl:  .  BREO ELLIPTA 200-25 MCG/INH AEPB, , Disp: , Rfl: 0 .  Brexpiprazole (REXULTI) 2 MG TABS, Take 2 mg by mouth daily., Disp: 90 tablet, Rfl: 0 .  busPIRone (BUSPAR) 30 MG tablet, Take 30 mg by mouth 2 (two) times daily., Disp: , Rfl:  .  clonazePAM (KLONOPIN) 0.5 MG tablet, Take 0.5 mg by mouth 2 (two) times daily as needed for anxiety., Disp: , Rfl:  .  etodolac (LODINE) 200 MG capsule, Take 200 mg by mouth 2 (two) times daily., Disp: , Rfl:  .  fenofibrate 54 MG tablet, , Disp: , Rfl:  .  FLUoxetine (PROZAC) 40 MG capsule, Take 2 capsules (80 mg total) by mouth daily., Disp: 180 capsule, Rfl: 3 .  fluticasone (FLONASE) 50 MCG/ACT nasal spray, Place 2 sprays into both nostrils daily., Disp: 16 g, Rfl: 2 .  lamoTRIgine (LAMICTAL) 200 MG tablet, Take 1 tablet (200 mg total) by mouth 2 (two) times daily., Disp: 180  tablet, Rfl: 3 .  Lancet Devices (SIMPLE DIAGNOSTICS LANCING DEV) MISC, , Disp: , Rfl:  .  LEVEMIR FLEXTOUCH 100 UNIT/ML Pen, , Disp: , Rfl:  .  levothyroxine (SYNTHROID, LEVOTHROID) 150 MCG tablet, Take 137 mcg by mouth daily., Disp: , Rfl:  .  montelukast (SINGULAIR) 10 MG tablet, , Disp: , Rfl: 4 .  omeprazole (PRILOSEC) 40 MG capsule, TAKE ONE CAPSULE BY MOUTH 2 TIMES A DAY, Disp: 60 capsule, Rfl: 2 .  Vitamin D, Ergocalciferol, (DRISDOL) 50000 UNITS CAPS capsule, , Disp: , Rfl:  Medication Side Effects: none  Family Medical/ Social History: Changes? No  MENTAL HEALTH EXAM:  There were no vitals taken for this visit.There is no height or weight on file to calculate BMI.  General Appearance: Well Groomed and Obese  Eye Contact:  Good  Speech:  Clear and Coherent  Volume:  Normal  Mood:  Euthymic  Affect:  Appropriate  Thought Process:  Goal Directed  Orientation:  Full (Time, Place, and Person)  Thought Content: Logical   Suicidal Thoughts:  No  Homicidal Thoughts:  No  Memory:  WNL  Judgement:  Good  Insight:  Good  Psychomotor Activity:  Normal  Concentration:  Concentration: Good  Recall:  Good  Fund of Knowledge: Good  Language: Good  Assets:  Desire for Improvement  ADL's:  Intact  Cognition: WNL  Prognosis:  Good  DIAGNOSES:    ICD-10-CM   1. Major depressive disorder, recurrent episode, moderate (HCC) F33.1   2. Mixed obsessional thoughts and acts F42.2   3. Generalized anxiety disorder F41.1     Receiving Psychotherapy: Yes    RECOMMENDATIONS: I'm glad to see her doing so well!  Continue all current meds.    Melony Overlyeresa Rickita Forstner, PA-C

## 2018-11-22 ENCOUNTER — Telehealth: Payer: Self-pay | Admitting: Physician Assistant

## 2018-11-22 NOTE — Telephone Encounter (Signed)
Please triage.  If she's more anxious, can increase Klonopin by another 0.5mg  pill qd.  (total of 2.5 mg/d but with idea of this being temporary)  Or can increase Lamictal to total of 450mg /d, but will need to draw Lamictal level after a few days.  Let me know.

## 2018-11-22 NOTE — Telephone Encounter (Signed)
Please tell Rosey Batheresa that I am crying and crying too much.  I would like her to call or have the nurse call.

## 2018-11-22 NOTE — Telephone Encounter (Signed)
Spoke with patient and she prefers to increase klonopin just short term and she agreed. Will call back next week if anymore issues.

## 2018-12-02 ENCOUNTER — Ambulatory Visit: Payer: BLUE CROSS/BLUE SHIELD | Admitting: Mental Health

## 2018-12-02 DIAGNOSIS — F411 Generalized anxiety disorder: Secondary | ICD-10-CM | POA: Diagnosis not present

## 2018-12-02 DIAGNOSIS — F331 Major depressive disorder, recurrent, moderate: Secondary | ICD-10-CM

## 2018-12-02 NOTE — Progress Notes (Signed)
      [facility] Counselor/Therapist Progress Note   Patient ID: Rebecca Roth, MRN: [id number]  Date: 12/02/2018  Timespent: 56 minutes  Treatment Type: Individual  Mental Status Exam:   Appearance:   Casual   Behavior:  Appropriate  Motor:  Normal  Speech/Language:   Normal Rate  Affect:  Full Range  Mood:  Depressed, tearful  Thought process:  Coherent and Relevant  Thought content:   Logical  Perceptual disturbances:   Normal  Orientation:  Full (Time, Place, and Person)  Attention:  Good  Concentration:  good  Memory:  Immediate  Fund of knowledge:   Good  Insight:   Good  Judgment:   Good  Impulse Control:  good    Reported Symptoms: Depressed mood, anxiety, hopelessness and helplessness, isolated behavior, deficits with attention and concentration, history of panic attacks  Risk Assessment: Danger to Self: No Self-injurious Behavior: No Danger to Others: No Duty to Warn:no Physical Aggression / Violence:No  Access to Firearms a concern: No  Gang Involvement:No  Patient / guardian was educated about steps to take if suicide or homicide risk level increases between visits. While future psychiatric events cannot be accurately predicted, the patient does not currently require acute inpatient psychiatric care and does not currently meet [facility] criteria.  Subjective: Patient arrived on time for today's session and in no distress.  We discussed progress and recent events.  She stated she was ill about a week ago, coping with the flu.  She stated this affected her mood, at one point she had panic-like symptoms toward the end of the illness.  She feels that this was in part due to feeling tired, and weak.  She shared continued progress she is making in communication in her relationships.  She continues to have a stable and supportive relationship with her close friends, daughter and husband.  She stated she continues  to receive support from her husband as he has taken steps to acknowledge and follow through with some of her requests.  Provide support continuing to work with patient from a strength-based approach.  Patient was encouraged to recognize positive steps she has taken for herself and how she is cultivated close relationships as she is a support to others and to her.  Interventions:CBT, Solution Focused and Strength-based  Diagnosis:   ICD-10-CM   1. Major depressive disorder, recurrent episode, moderate (HCC) F33.1   2. Generalized anxiety disorder F41.1      Plan:  1.  Patient to continue to engage in individual counseling 2-4 times a month or as needed. 2.  Patient to identify and apply CBT, coping skills learned in session to decrease depression and anxiety symptoms. 3.  Patient to improve effective communication and assertiveness with others. 4.  Patient to contact this office, go to the local ED or call 911 if a crisis or emergency develops between visits.  Waldron Sessionhristopher Jeramine Delis, [facility]

## 2018-12-16 ENCOUNTER — Ambulatory Visit: Payer: BLUE CROSS/BLUE SHIELD | Admitting: Physician Assistant

## 2018-12-16 ENCOUNTER — Encounter: Payer: Self-pay | Admitting: Physician Assistant

## 2018-12-16 DIAGNOSIS — F411 Generalized anxiety disorder: Secondary | ICD-10-CM

## 2018-12-16 DIAGNOSIS — F422 Mixed obsessional thoughts and acts: Secondary | ICD-10-CM | POA: Diagnosis not present

## 2018-12-16 DIAGNOSIS — F331 Major depressive disorder, recurrent, moderate: Secondary | ICD-10-CM | POA: Diagnosis not present

## 2018-12-16 MED ORDER — BREXPIPRAZOLE 2 MG PO TABS: 2.0000 mg | ORAL_TABLET | Freq: Every day | ORAL | 5 refills | Status: DC

## 2018-12-16 MED ORDER — BUSPIRONE HCL 30 MG PO TABS: 30.0000 mg | ORAL_TABLET | Freq: Two times a day (BID) | ORAL | 5 refills | Status: DC

## 2018-12-16 NOTE — Progress Notes (Signed)
[facility] Med Check  Patient ID: Rebecca KennerCynthia Roth,  MRN: [id number]  PCP: Blane Oharaox, Kirsten, MD  Date of Evaluation: 12/16/2018 Time spent:15 minutes  Chief Complaint:  Chief Complaint    Anxiety; Follow-up      HISTORY/CURRENT STATUS: HPI Here for routine med check.  Doing well.  She had a couple of bad days but doing fine now.  Was more anxious.  Had to use Klonopin those days but it worked. Not needing that often.  Patient denies loss of interest in usual activities and is able to enjoy things.  Denies decreased energy or motivation.  Appetite has not changed.  No extreme sadness, tearfulness, or feelings of hopelessness.  Denies any changes in concentration, making decisions or remembering things.  Denies suicidal or homicidal thoughts.  Sleeps good most of the time.   Individual Medical History/ Review of Systems: Changes? :No   Allergies: Ace inhibitors and Sulfa antibiotics   Current Medications:  Current Outpatient Medications:  .  atorvastatin (LIPITOR) 40 MG tablet, Take 40 mg by mouth daily., Disp: , Rfl:  .  BAYER CONTOUR TEST test strip, , Disp: , Rfl:  .  BREO ELLIPTA 200-25 MCG/INH AEPB, , Disp: , Rfl: 0 .  Brexpiprazole (REXULTI) 2 MG TABS, Take 2 mg by mouth daily., Disp: 90 tablet, Rfl: 0 .  busPIRone (BUSPAR) 30 MG tablet, Take 30 mg by mouth 2 (two) times daily., Disp: , Rfl:  .  clonazePAM (KLONOPIN) 0.5 MG tablet, Take 0.5 mg by mouth 2 (two) times daily as needed for anxiety., Disp: , Rfl:  .  etodolac (LODINE) 200 MG capsule, Take 200 mg by mouth 2 (two) times daily., Disp: , Rfl:  .  fenofibrate 54 MG tablet, , Disp: , Rfl:  .  FLUoxetine (PROZAC) 40 MG capsule, Take 2 capsules (80 mg total) by mouth daily., Disp: 180 capsule, Rfl: 3 .  fluticasone (FLONASE) 50 MCG/ACT nasal spray, Place 2 sprays into both nostrils daily., Disp: 16 g, Rfl: 2 .  lamoTRIgine (LAMICTAL) 200 MG tablet, Take 1 tablet (200 mg total) by mouth 2 (two) times daily., Disp: 180  tablet, Rfl: 3 .  Lancet Devices (SIMPLE DIAGNOSTICS LANCING DEV) MISC, , Disp: , Rfl:  .  LEVEMIR FLEXTOUCH 100 UNIT/ML Pen, , Disp: , Rfl:  .  levothyroxine (SYNTHROID, LEVOTHROID) 150 MCG tablet, Take 137 mcg by mouth daily., Disp: , Rfl:  .  montelukast (SINGULAIR) 10 MG tablet, , Disp: , Rfl: 4 .  omeprazole (PRILOSEC) 40 MG capsule, TAKE ONE CAPSULE BY MOUTH 2 TIMES A DAY, Disp: 60 capsule, Rfl: 2 .  Vitamin D, Ergocalciferol, (DRISDOL) 50000 UNITS CAPS capsule, , Disp: , Rfl:  .  Brexpiprazole (REXULTI) 2 MG TABS, Take 2 mg by mouth daily., Disp: 30 tablet, Rfl: 5 .  busPIRone (BUSPAR) 30 MG tablet, Take 1 tablet (30 mg total) by mouth 2 (two) times daily., Disp: 60 tablet, Rfl: 5 Medication Side Effects: none  Family Medical/ Social History: Changes? Yes she is packing and getting ready to move.  MENTAL HEALTH EXAM:  There were no vitals taken for this visit.There is no height or weight on file to calculate BMI.  General Appearance: Casual  Eye Contact:  Good  Speech:  Clear and Coherent  Volume:  Normal  Mood:  Euthymic  Affect:  Appropriate  Thought Process:  Goal Directed  Orientation:  Full (Time, Place, and Person)  Thought Content: Logical   Suicidal Thoughts:  No  Homicidal Thoughts:  No  Memory:  WNL  Judgement:  Good  Insight:  Good  Psychomotor Activity:  Normal  Concentration:  Concentration: Good  Recall:  Good  Fund of Knowledge: Good  Language: Good  Assets:  Desire for Improvement  ADL's:  Intact  Cognition: WNL  Prognosis:  Good    DIAGNOSES:    ICD-10-CM   1. Generalized anxiety disorder F41.1   2. Mixed obsessional thoughts and acts F42.2   3. Major depressive disorder, recurrent episode, moderate (HCC) F33.1     Receiving Psychotherapy: Yes Elio ForgetChris Andrews    RECOMMENDATIONS: I am glad to see her doing so well overall. Continue all current medications. Continue psychotherapy with Elio Forgethris Andrews, LPC. Return in 4 months or sooner as  needed.   Melony Overlyeresa Sinthia Karabin, PA-C

## 2018-12-23 ENCOUNTER — Ambulatory Visit: Payer: BLUE CROSS/BLUE SHIELD | Admitting: Mental Health

## 2018-12-23 DIAGNOSIS — F411 Generalized anxiety disorder: Secondary | ICD-10-CM | POA: Diagnosis not present

## 2018-12-23 DIAGNOSIS — F331 Major depressive disorder, recurrent, moderate: Secondary | ICD-10-CM

## 2019-01-09 NOTE — Progress Notes (Signed)
      [facility] Counselor/Therapist Progress Note   Patient ID: Rebecca Roth, MRN: [id number]  Date: 01/09/2019  Timespent: 56 minutes  Treatment Type: Individual  Mental Status Exam:   Appearance:   Casual   Behavior:  Appropriate  Motor:  Normal  Speech/Language:   Normal Rate  Affect:  Full Range  Mood:  Depressed, tearful  Thought process:  Coherent and Relevant  Thought content:   Logical  Perceptual disturbances:   Normal  Orientation:  Full (Time, Place, and Person)  Attention:  Good  Concentration:  good  Memory:  Immediate  Fund of knowledge:   Good  Insight:   Good  Judgment:   Good  Impulse Control:  good    Reported Symptoms: Depressed mood, anxiety, hopelessness and helplessness, isolated behavior, deficits with attention and concentration, history of panic attacks  Risk Assessment: Danger to Self: No Self-injurious Behavior: No Danger to Others: No Duty to Warn:no Physical Aggression / Violence:No  Access to Firearms a concern: No  Gang Involvement:No  Patient / guardian was educated about steps to take if suicide or homicide risk level increases between visits. While future psychiatric events cannot be accurately predicted, the patient does not currently require acute inpatient psychiatric care and does not currently meet [facility] involuntary commitment criteria.  Subjective: Patient arrived on time for today's session and in no distress.  We discussed progress and recent events.  She shared recent relational stress she has had with her daughter, going into some detail the session.  Most of the session was centered around having some recent disagreements with her daughter, namely about how she parents her children.  She stated that she knows that while they were going to live together in the next month or 2, she wants them to get along as best as possible.  She stated to have a very close relationship but knows that living  together can bring its challenges.  Ways to communicate effectively were discussed with patient.  She plans to follow through with these skills between sessions with her daughter.  Interventions:CBT, Solution Focused and Strength-based  Diagnosis:   ICD-10-CM   1. Generalized anxiety disorder F41.1   2. Major depressive disorder, recurrent episode, moderate (HCC) F33.1      Plan:  1.  Patient to continue to engage in individual counseling 2-4 times a month or as needed. 2.  Patient to identify and apply CBT, coping skills learned in session to decrease depression and anxiety symptoms. 3.  Patient to improve effective communication and assertiveness with others. 4.  Patient to contact this office, go to the local ED or call 911 if a crisis or emergency develops between visits.  Waldron Session, [facility]

## 2019-02-03 ENCOUNTER — Ambulatory Visit (INDEPENDENT_AMBULATORY_CARE_PROVIDER_SITE_OTHER): Payer: BLUE CROSS/BLUE SHIELD | Admitting: Mental Health

## 2019-02-03 DIAGNOSIS — F411 Generalized anxiety disorder: Secondary | ICD-10-CM | POA: Diagnosis not present

## 2019-02-03 NOTE — Progress Notes (Signed)
      [facility] Counselor/Therapist Progress Note   Patient ID: Rebecca Roth, MRN: [id number]  Date: 02/22/2019  Timespent: 56 minutes  Treatment Type: Individual  Mental Status Exam:   Appearance:   Casual   Behavior:  Appropriate  Motor:  Normal  Speech/Language:   Normal Rate  Affect:  Full Range  Mood:  Depressed, tearful  Thought process:  Coherent and Relevant  Thought content:   Logical  Perceptual disturbances:   Normal  Orientation:  Full (Time, Place, and Person)  Attention:  Good  Concentration:  good  Memory:  Immediate  Fund of knowledge:   Good  Insight:   Good  Judgment:   Good  Impulse Control:  good    Reported Symptoms: Depressed mood, anxiety, hopelessness and helplessness, isolated behavior, deficits with attention and concentration, history of panic attacks  Risk Assessment: Danger to Self: No Self-injurious Behavior: No Danger to Others: No Duty to Warn:no Physical Aggression / Violence:No  Access to Firearms a concern: No  Gang Involvement:No  Patient / guardian was educated about steps to take if suicide or homicide risk level increases between visits. While future psychiatric events cannot be accurately predicted, the patient does not currently require acute inpatient psychiatric care and does not currently meet [facility] criteria.  Subjective: Patient arrived on time for today's session and in no distress.  She shared how she has been doing well, work stresses low and she and her husband have moved into a home that they share with her daughter, her husband and child.  She says working out well, patient and her husband have their own separate space in the home that gives them a sense of independence as well as for her daughter's family.  She stated she was able to set some boundaries recently with her son-in-law due to his making a negative, hurtful comment, which can be typical at times.   She shared how she and her husband continue to support one another and communication continues to thrive.  Patient continues to reach out to other additional friends, supports as needed as well as staying involved with her church and activities.  No concerns of regarding her medication at this time.  Patient was able to review and verbalize efforts successfully toward coping.  Interventions:CBT, Solution Focused and Strength-based  Diagnosis:   ICD-10-CM   1. Generalized anxiety disorder F41.1      Plan:  1.  Patient to continue to engage in individual counseling 2-4 times a month or as needed. 2.  Patient to identify and apply CBT, coping skills learned in session to decrease depression and anxiety symptoms. 3.  Patient to improve effective communication and assertiveness with others. 4.  Patient to contact this office, go to the local ED or call 911 if a crisis or emergency develops between visits.  Waldron Sessionhristopher Jahmar Mckelvy, [facility]

## 2019-03-23 ENCOUNTER — Telehealth: Payer: Self-pay | Admitting: Physician Assistant

## 2019-03-23 NOTE — Telephone Encounter (Signed)
Would one of y'all please triage?  Thanks!

## 2019-03-23 NOTE — Telephone Encounter (Signed)
atient called and said that she has no energy at all and thinks maybe she needs to increase her medicine. Please give her a call at [phone]

## 2019-03-23 NOTE — Telephone Encounter (Signed)
Tried to reach pt at number left but no answer and mail box full. Will attempt later

## 2019-03-23 NOTE — Telephone Encounter (Signed)
Spoke to Pt. Stated she has had no energy for several weeks since her last visit. All of her meds were working but now she is lethargic,sleeping all the time, hard for her to shower to start her day. These were the s&s before she went into her deep depression and she does not want to go there, again. Wants to fuction normally.

## 2019-03-24 ENCOUNTER — Other Ambulatory Visit: Payer: Self-pay

## 2019-03-24 ENCOUNTER — Other Ambulatory Visit: Payer: Self-pay | Admitting: Physician Assistant

## 2019-03-24 MED ORDER — BREXPIPRAZOLE 3 MG PO TABS: 3.0000 mg | ORAL_TABLET | Freq: Every day | ORAL | 1 refills | Status: DC

## 2019-03-24 NOTE — Telephone Encounter (Signed)
The medications Pt has tried; Cymbalta, VPA, Effexor, Celexa, Wellbutrin, Abilify, ?Zoloft,  ?Lexapro, Prozac.

## 2019-03-24 NOTE — Telephone Encounter (Signed)
Rx for rexulti sent to [facility]. Unable to notify pt d/t full mail box.

## 2019-03-24 NOTE — Telephone Encounter (Signed)
Could you do me a huge favor?  Get her paper chart and let me know all the meds she's tried in the past.  They should be listed on the bottom of the med list, or in her initial visit, or both, esp if she's tried abilify or Wellbutrin.  I'm sorry but I didn't list those in her notes in the computer.  Thank you

## 2019-03-24 NOTE — Telephone Encounter (Signed)
Thanks for the info.  Have her increase the Rexulti to 3 mg/d.  I'll put this in the computer and pend the order.  Find out her pharmacy and you can send it in.

## 2019-03-27 MED ORDER — BREXPIPRAZOLE 3 MG PO TABS: 3.0000 mg | ORAL_TABLET | Freq: Every day | ORAL | 1 refills | Status: DC

## 2019-04-14 ENCOUNTER — Ambulatory Visit: Payer: BLUE CROSS/BLUE SHIELD | Admitting: Physician Assistant

## 2019-04-14 ENCOUNTER — Other Ambulatory Visit: Payer: Self-pay | Admitting: Physician Assistant

## 2019-05-05 ENCOUNTER — Ambulatory Visit: Payer: BLUE CROSS/BLUE SHIELD | Admitting: Mental Health

## 2019-06-16 ENCOUNTER — Other Ambulatory Visit: Payer: Self-pay

## 2019-06-16 ENCOUNTER — Encounter: Payer: Self-pay | Admitting: Physician Assistant

## 2019-06-16 ENCOUNTER — Ambulatory Visit: Payer: Self-pay | Admitting: Physician Assistant

## 2019-06-16 DIAGNOSIS — F411 Generalized anxiety disorder: Secondary | ICD-10-CM

## 2019-06-16 DIAGNOSIS — F422 Mixed obsessional thoughts and acts: Secondary | ICD-10-CM

## 2019-06-16 DIAGNOSIS — F331 Major depressive disorder, recurrent, moderate: Secondary | ICD-10-CM

## 2019-06-16 MED ORDER — BUSPIRONE HCL 30 MG PO TABS: 30.0000 mg | ORAL_TABLET | Freq: Two times a day (BID) | ORAL | 5 refills | Status: DC

## 2019-06-16 MED ORDER — FLUOXETINE HCL 40 MG PO CAPS: 80.0000 mg | ORAL_CAPSULE | Freq: Every day | ORAL | 5 refills | Status: DC

## 2019-06-16 MED ORDER — REXULTI 2 MG PO TABS: 2.0000 mg | ORAL_TABLET | Freq: Every day | ORAL | 1 refills | Status: DC

## 2019-06-16 NOTE — Progress Notes (Signed)
[facility] Med Check  Patient ID: Rebecca KennerCynthia Roth,  MRN: [id number]  PCP: Blane Oharaox, Kirsten, MD  Date of Evaluation: 06/16/2019 Time spent:15 minutes  Chief Complaint:  Chief Complaint    Follow-up      HISTORY/CURRENT STATUS: HPI For routine med check.  Doing really well.  Would like to decrease the Rexulti b/c she's doing so well. "I'd like to see how I do on it." Hasn't cried like she used to.  She is very happy now that she and her husband have moved into their new home.  Her daughter and her husband are separated, "which is a good thing."  They live with her daughter and grandson.  States that is been a really good move.  Her husband has been furloughed because of the coronavirus pandemic and he is now planning on retiring.  They are both happy about that.  "He has been a workaholic his life working 6 and 7 days a week and I am glad to see him enjoying life a little."  Patient denies loss of interest in usual activities and is able to enjoy things.  Denies decreased energy or motivation.  Appetite has not changed.  No extreme sadness, tearfulness, or feelings of hopelessness.  Denies any changes in concentration, making decisions or remembering things.  Denies suicidal or homicidal thoughts.  She is not nearly as anxious as she used to be.  She occasionally takes the Klonopin but not very often now.  States they make her really sleepy when she does take it so she does everything to avoid that.  OCD symptoms are rare now.  Denies dizziness, syncope, seizures, numbness, tingling, tremor, tics, unsteady gait, slurred speech, confusion. Denies muscle or joint pain, stiffness, or dystonia.  Individual Medical History/ Review of Systems: Changes? :No    Past medications for mental health diagnoses include: unknown  Allergies: Ace inhibitors and Sulfa antibiotics  Current Medications:  Current Outpatient Medications:  .  atorvastatin (LIPITOR) 40 MG tablet, Take 40 mg by mouth daily.,  Disp: , Rfl:  .  BAYER CONTOUR TEST test strip, , Disp: , Rfl:  .  BREO ELLIPTA 200-25 MCG/INH AEPB, , Disp: , Rfl: 0 .  busPIRone (BUSPAR) 30 MG tablet, Take 1 tablet (30 mg total) by mouth 2 (two) times daily., Disp: 60 tablet, Rfl: 5 .  clonazePAM (KLONOPIN) 0.5 MG tablet, Take 0.5 mg by mouth 2 (two) times daily as needed for anxiety., Disp: , Rfl:  .  etodolac (LODINE) 200 MG capsule, Take 200 mg by mouth 2 (two) times daily., Disp: , Rfl:  .  fenofibrate 54 MG tablet, , Disp: , Rfl:  .  FLUoxetine (PROZAC) 40 MG capsule, Take 2 capsules (80 mg total) by mouth daily., Disp: 60 capsule, Rfl: 5 .  fluticasone (FLONASE) 50 MCG/ACT nasal spray, Place 2 sprays into both nostrils daily., Disp: 16 g, Rfl: 2 .  lamoTRIgine (LAMICTAL) 200 MG tablet, Take 1 tablet (200 mg total) by mouth 2 (two) times daily., Disp: 180 tablet, Rfl: 3 .  Lancet Devices (SIMPLE DIAGNOSTICS LANCING DEV) MISC, , Disp: , Rfl:  .  LEVEMIR FLEXTOUCH 100 UNIT/ML Pen, , Disp: , Rfl:  .  levothyroxine (SYNTHROID, LEVOTHROID) 150 MCG tablet, Take 137 mcg by mouth daily., Disp: , Rfl:  .  montelukast (SINGULAIR) 10 MG tablet, , Disp: , Rfl: 4 .  omeprazole (PRILOSEC) 40 MG capsule, TAKE ONE CAPSULE BY MOUTH 2 TIMES A DAY, Disp: 60 capsule, Rfl: 2 .  Vitamin D, Ergocalciferol, (  DRISDOL) 50000 UNITS CAPS capsule, , Disp: , Rfl:  .  Brexpiprazole (REXULTI) 2 MG TABS, Take 2 mg by mouth daily., Disp: 30 tablet, Rfl: 1 Medication Side Effects: none  Family Medical/ Social History: Changes? Yes she and husband moved into new home.   MENTAL HEALTH EXAM:  There were no vitals taken for this visit.There is no height or weight on file to calculate BMI.  General Appearance: Casual and Obese  Eye Contact:  Good  Speech:  Clear and Coherent  Volume:  Normal  Mood:  Euthymic  Affect:  Appropriate  Thought Process:  Goal Directed  Orientation:  Full (Time, Place, and Person)  Thought Content: Logical   Suicidal Thoughts:  No   Homicidal Thoughts:  No  Memory:  WNL  Judgement:  Good  Insight:  Good  Psychomotor Activity:  Normal  Concentration:  Concentration: Good  Recall:  Good  Fund of Knowledge: Good  Language: Good  Assets:  Desire for Improvement  ADL's:  Intact  Cognition: WNL  Prognosis:  Good    DIAGNOSES:    ICD-10-CM   1. Generalized anxiety disorder  F41.1   2. Major depressive disorder, recurrent episode, moderate (HCC)  F33.1   3. Mixed obsessional thoughts and acts  F42.2     Receiving Psychotherapy: No   RECOMMENDATIONS:  Decrease Rexulti to 2 mg p.o. daily.  If this works well, we can send in refills.  If she notices any dip in her mood then will increase back to 3 mg.  She will let me know in 1 to 2 months how that is going. Continue BuSpar 30 mg 1 twice daily. Continue Klonopin 0.5 mg twice daily as needed. Continue Prozac 80 mg daily. Continue Lamictal 200 mg twice daily. Return in 6 months.  Donnal Moat, PA-C   This record has been created using [employer].  Chart creation errors have been sought, but may not always have been located and corrected. Such creation errors do not reflect on the standard of medical care.

## 2019-06-19 ENCOUNTER — Telehealth: Payer: Self-pay

## 2019-06-19 NOTE — Telephone Encounter (Signed)
Prior authorization approved for Rexulti 2 mg through [employer] effective 06/16/2019-06/14/2022.

## 2019-06-22 ENCOUNTER — Ambulatory Visit: Payer: BLUE CROSS/BLUE SHIELD | Admitting: Mental Health

## 2019-06-28 ENCOUNTER — Other Ambulatory Visit: Payer: Self-pay

## 2019-06-28 ENCOUNTER — Ambulatory Visit: Payer: Self-pay | Admitting: Mental Health

## 2019-06-28 DIAGNOSIS — F411 Generalized anxiety disorder: Secondary | ICD-10-CM

## 2019-07-11 ENCOUNTER — Other Ambulatory Visit: Payer: Self-pay | Admitting: Physician Assistant
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
## 2019-10-24 ENCOUNTER — Other Ambulatory Visit: Payer: Self-pay | Admitting: Physician Assistant

## 2019-11-03 ENCOUNTER — Ambulatory Visit (INDEPENDENT_AMBULATORY_CARE_PROVIDER_SITE_OTHER): Payer: BLUE CROSS/BLUE SHIELD | Admitting: Mental Health

## 2019-11-03 ENCOUNTER — Other Ambulatory Visit: Payer: Self-pay

## 2019-11-03 DIAGNOSIS — F411 Generalized anxiety disorder: Secondary | ICD-10-CM | POA: Diagnosis not present

## 2019-11-26 NOTE — Progress Notes (Signed)
      [facility] Counselor/Therapist Progress Note   Patient ID: Rebecca Roth, MRN: [id number]  Date: 11/03/19  Timespent: 40 minutes  Treatment Type: Individual  Mental Status Exam:   Appearance:   Casual   Behavior:  Appropriate  Motor:  Normal  Speech/Language:   Normal Rate  Affect:  Full Range  Mood:  Depressed, tearful  Thought process:  Coherent and Relevant  Thought content:   Logical  Perceptual disturbances:   Normal  Orientation:  Full (Time, Place, and Person)  Attention:  Good  Concentration:  good  Memory:  Immediate  Fund of knowledge:   Good  Insight:   Good  Judgment:   Good  Impulse Control:  good    Reported Symptoms: Depressed mood, anxiety, hopelessness and helplessness, isolated behavior, deficits with attention and concentration, history of panic attacks  Risk Assessment: Danger to Self: No Self-injurious Behavior: No Danger to Others: No Duty to Warn:no Physical Aggression / Violence:No  Access to Firearms a concern: No  Gang Involvement:No  Patient / guardian was educated about steps to take if suicide or homicide risk level increases between visits. While future psychiatric events cannot be accurately predicted, the patient does not currently require acute inpatient psychiatric care and does not currently meet [facility] involuntary commitment criteria.  Subjective: Patient arrived on time for today's session.  Discussed progress, events since her last session which was about 3 months ago.  Most of the session was centered around relationship with her daughter, some disappointments and some of her recent behaviors.  Patient stated that it affected her significantly.  Provided support.  She shared how she has improved coping over the last few weeks since the issue with her daughter was discussed.  Reviewed coping, thought blocking, mindfulness as well as self-care engaging in pleasurable interests.  Patient shared  her efforts over the last few weeks and plans to continue.  Interventions:CBT, Solution Focused and Strength-based  Diagnosis:   ICD-10-CM   1. Generalized anxiety disorder  F41.1      Plan:  1.  Patient to continue to engage in individual counseling 2-4 times a month or as needed. 2.  Patient to identify and apply CBT, coping skills learned in session to decrease depression and anxiety symptoms. 3.  Patient to improve effective communication and assertiveness with others. 4.  Patient to contact this office, go to the local ED or call 911 if a crisis or emergency develops between visits.  Anson [place], [facility]

## 2019-12-08 ENCOUNTER — Other Ambulatory Visit: Payer: Self-pay

## 2019-12-08 ENCOUNTER — Encounter: Payer: Self-pay | Admitting: Physician Assistant

## 2019-12-08 ENCOUNTER — Ambulatory Visit (INDEPENDENT_AMBULATORY_CARE_PROVIDER_SITE_OTHER): Payer: BLUE CROSS/BLUE SHIELD | Admitting: Physician Assistant

## 2019-12-08 DIAGNOSIS — F422 Mixed obsessional thoughts and acts: Secondary | ICD-10-CM

## 2019-12-08 DIAGNOSIS — F331 Major depressive disorder, recurrent, moderate: Secondary | ICD-10-CM | POA: Diagnosis not present

## 2019-12-08 DIAGNOSIS — F411 Generalized anxiety disorder: Secondary | ICD-10-CM

## 2019-12-08 MED ORDER — LAMOTRIGINE 200 MG PO TABS: ORAL_TABLET | ORAL | 5 refills | Status: DC

## 2019-12-08 MED ORDER — BREXPIPRAZOLE 2 MG PO TABS: 2.0000 mg | ORAL_TABLET | Freq: Every day | ORAL | 5 refills | Status: DC

## 2019-12-08 MED ORDER — BUSPIRONE HCL 30 MG PO TABS: 30.0000 mg | ORAL_TABLET | Freq: Two times a day (BID) | ORAL | 5 refills | Status: DC

## 2019-12-08 MED ORDER — FLUOXETINE HCL 40 MG PO CAPS: 80.0000 mg | ORAL_CAPSULE | Freq: Every day | ORAL | 5 refills | Status: DC

## 2019-12-08 NOTE — Progress Notes (Signed)
[facility] Med Check  Patient ID: Rebecca Roth,  MRN: [id number]  PCP: Blane Ohara, MD  Date of Evaluation: 12/08/2019 Time spent:15 minutes  Chief Complaint:  Chief Complaint    Anxiety; Depression; Follow-up      HISTORY/CURRENT STATUS: HPI For routine med check.  Doing well overall.  Feels like the meds are working well. Anxiety is well-controlled and doesn't often take the Klonopin. "I'm still on the first prescription."  Patient denies loss of interest in usual activities and is able to enjoy things.  Denies decreased energy or motivation.  Appetite has not changed.  No extreme sadness, tearfulness, or feelings of hopelessness.  Denies any changes in concentration, making decisions or remembering things.  Denies suicidal or homicidal thoughts.  Only problem is not sleeping well.  Able to go to sleep but sometimes wakes up after 6 hours and has trouble going back to sleep.  But she can sleep late if she wants to so it is not that much of a problem.  Denies dizziness, syncope, seizures, numbness, tingling, tremor, tics, unsteady gait, slurred speech, confusion. Denies muscle or joint pain, stiffness, or dystonia.  Individual Medical History/ Review of Systems: Changes? :No    Past medications for mental health diagnoses include: Unknown  Allergies: Ace inhibitors and Sulfa antibiotics  Current Medications:  Current Outpatient Medications:  .  atorvastatin (LIPITOR) 40 MG tablet, Take 40 mg by mouth daily., Disp: , Rfl:  .  BAYER CONTOUR TEST test strip, , Disp: , Rfl:  .  BREO ELLIPTA 200-25 MCG/INH AEPB, , Disp: , Rfl: 0 .  brexpiprazole (REXULTI) 2 MG TABS tablet, Take 1 tablet (2 mg total) by mouth daily., Disp: 30 tablet, Rfl: 5 .  busPIRone (BUSPAR) 30 MG tablet, Take 1 tablet (30 mg total) by mouth 2 (two) times daily., Disp: 60 tablet, Rfl: 5 .  clonazePAM (KLONOPIN) 0.5 MG tablet, Take 0.5 mg by mouth 2 (two) times daily as needed for anxiety., Disp: , Rfl:   .  etodolac (LODINE) 200 MG capsule, Take 200 mg by mouth 2 (two) times daily., Disp: , Rfl:  .  fenofibrate 54 MG tablet, , Disp: , Rfl:  .  FLUoxetine (PROZAC) 40 MG capsule, Take 2 capsules (80 mg total) by mouth daily., Disp: 60 capsule, Rfl: 5 .  fluticasone (FLONASE) 50 MCG/ACT nasal spray, Place 2 sprays into both nostrils daily., Disp: 16 g, Rfl: 2 .  lamoTRIgine (LAMICTAL) 200 MG tablet, TAKE ONE TABLET BY MOUTH 2 TIMES A DAY, Disp: 60 tablet, Rfl: 5 .  Lancet Devices (SIMPLE DIAGNOSTICS LANCING DEV) MISC, , Disp: , Rfl:  .  LEVEMIR FLEXTOUCH 100 UNIT/ML Pen, , Disp: , Rfl:  .  levothyroxine (SYNTHROID, LEVOTHROID) 150 MCG tablet, Take 137 mcg by mouth daily., Disp: , Rfl:  .  montelukast (SINGULAIR) 10 MG tablet, , Disp: , Rfl: 4 .  omeprazole (PRILOSEC) 40 MG capsule, TAKE ONE CAPSULE BY MOUTH 2 TIMES A DAY, Disp: 60 capsule, Rfl: 2 .  SYNJARDY XR 25-1000 MG TB24, , Disp: , Rfl:  .  valsartan (DIOVAN) 80 MG tablet, Take 80 mg by mouth daily., Disp: , Rfl:  .  Vitamin D, Ergocalciferol, (DRISDOL) 50000 UNITS CAPS capsule, , Disp: , Rfl:  Medication Side Effects: none  Family Medical/ Social History: Changes? No  MENTAL HEALTH EXAM:  There were no vitals taken for this visit.There is no height or weight on file to calculate BMI.  General Appearance: Casual, Neat, Well Groomed and Obese  Eye Contact:  Good  Speech:  Clear and Coherent  Volume:  Normal  Mood:  Euthymic  Affect:  Appropriate  Thought Process:  Goal Directed and Descriptions of Associations: Intact  Orientation:  Full (Time, Place, and Person)  Thought Content: Logical   Suicidal Thoughts:  No  Homicidal Thoughts:  No  Memory:  WNL  Judgement:  Good  Insight:  Good  Psychomotor Activity:  Normal  Concentration:  Concentration: Good  Recall:  Good  Fund of Knowledge: Good  Language: Good  Assets:  Desire for Improvement  ADL's:  Intact  Cognition: WNL  Prognosis:  Good    DIAGNOSES:    ICD-10-CM    1. Generalized anxiety disorder  F41.1   2. Major depressive disorder, recurrent episode, moderate (HCC)  F33.1   3. Mixed obsessional thoughts and acts  F42.2     Receiving Psychotherapy: Yes  Lanetta Inch, [facility]   RECOMMENDATIONS:  Continue Rexulti 2 mg daily. Continue BuSpar 30 mg twice daily. Continue Klonopin 0.5 mg twice daily as needed. Continue Prozac 40 mg, 2 p.o. daily. Continue Lamictal 200 mg, 1 p.o. twice daily. Discussed sleep hygiene.  Consider trazodone or mirtazapine if needed in the future. Continue psychotherapy with Lanetta Inch, [facility]. Return in 4 months.  Donnal Moat, PA-C

## 2019-12-18 ENCOUNTER — Telehealth: Payer: Self-pay | Admitting: Physician Assistant

## 2019-12-18 NOTE — Telephone Encounter (Signed)
Rebecca Roth called to request a refill of Rebecca Roth's clonazepam 0.5mg .  She said she hadn't had it filled in about 2 yrs, but was requesting a refill. They have filled it there which is why called instead of sending a escribe request.  She has appt 04/12/20.  Can escribe to [facility] [employer].

## 2019-12-19 ENCOUNTER — Other Ambulatory Visit: Payer: Self-pay | Admitting: Physician Assistant

## 2019-12-19 MED ORDER — CLONAZEPAM 0.5 MG PO TABS: 0.5000 mg | ORAL_TABLET | Freq: Two times a day (BID) | ORAL | 3 refills | Status: DC | PRN

## 2019-12-19 NOTE — Telephone Encounter (Signed)
Rx was sent to pharm

## 2020-01-09 ENCOUNTER — Telehealth: Payer: Self-pay

## 2020-01-09 NOTE — Telephone Encounter (Signed)
Prior authorization submitted and approved for Rexulti 2 mg through [employer] effective 01/09/2020-01/08/2021 PA# [id number]   Submitted through cover my meds

## 2020-01-12 ENCOUNTER — Telehealth: Payer: Self-pay | Admitting: Physician Assistant

## 2020-01-12 NOTE — Telephone Encounter (Signed)
Pt was called and aware

## 2020-01-12 NOTE — Telephone Encounter (Signed)
Noted thank you

## 2020-01-12 NOTE — Telephone Encounter (Signed)
Pt called to get info about Rx for Rexulti. Has new insurance [employer] and will not cover Rexulti. Do we have a card for savings on this drug. Please advise Pt @ [phone]

## 2020-01-24 ENCOUNTER — Telehealth: Payer: Self-pay | Admitting: Physician Assistant

## 2020-01-24 ENCOUNTER — Other Ambulatory Visit: Payer: Self-pay

## 2020-01-24 MED ORDER — BREXPIPRAZOLE 2 MG PO TABS: 2.0000 mg | ORAL_TABLET | Freq: Every day | ORAL | 5 refills | Status: DC

## 2020-01-24 MED ORDER — BUSPIRONE HCL 30 MG PO TABS: 30.0000 mg | ORAL_TABLET | Freq: Two times a day (BID) | ORAL | 5 refills | Status: DC

## 2020-01-24 NOTE — Telephone Encounter (Signed)
Rx's for Buspar and [facility]ulti [facility] [place]

## 2020-01-24 NOTE — Telephone Encounter (Signed)
Chanci called to report that due to insurance she has to change pharmacies.  [facility]  [facility] in [facility]ills [facility]par and [facility] [address] [facility] [address] [facility] [address] in [place].

## 2020-02-05 ENCOUNTER — Telehealth: Payer: Self-pay

## 2020-02-05 NOTE — Telephone Encounter (Signed)
Pt dropped off application for asst program [employer]. Placed forms in box for Dr. Sedalia Muta associate to complete and fax to [facility].

## 2020-02-14 ENCOUNTER — Other Ambulatory Visit: Payer: Self-pay | Admitting: Physician Assistant

## 2020-02-14 MED ORDER — SURE COMFORT PEN NEEDLES 31G X 5 MM MISC: 1 refills | Status: DC

## 2020-02-14 MED ORDER — SURE COMFORT PEN NEEDLES 31G X 5 MM MISC: 1 refills | Status: AC

## 2020-02-24 ENCOUNTER — Other Ambulatory Visit: Payer: Self-pay | Admitting: Physician Assistant

## 2020-03-01 ENCOUNTER — Ambulatory Visit: Payer: Self-pay | Admitting: Mental Health

## 2020-03-03 ENCOUNTER — Other Ambulatory Visit: Payer: Self-pay | Admitting: Family Medicine

## 2020-03-04 ENCOUNTER — Ambulatory Visit: Payer: BLUE CROSS/BLUE SHIELD | Admitting: Physician Assistant

## 2020-03-22 ENCOUNTER — Ambulatory Visit: Payer: Self-pay | Admitting: Mental Health

## 2020-03-28 ENCOUNTER — Other Ambulatory Visit: Payer: Self-pay | Admitting: Physician Assistant

## 2020-03-29 ENCOUNTER — Other Ambulatory Visit: Payer: Self-pay

## 2020-03-29 MED ORDER — VALSARTAN 80 MG PO TABS: 80.0000 mg | ORAL_TABLET | Freq: Every day | ORAL | 0 refills | Status: AC

## 2020-04-01 ENCOUNTER — Other Ambulatory Visit: Payer: Self-pay

## 2020-04-01 ENCOUNTER — Ambulatory Visit (INDEPENDENT_AMBULATORY_CARE_PROVIDER_SITE_OTHER): Payer: 59 | Admitting: Mental Health

## 2020-04-01 DIAGNOSIS — F411 Generalized anxiety disorder: Secondary | ICD-10-CM

## 2020-04-01 NOTE — Progress Notes (Signed)
      [facility] Counselor/Therapist Progress Note   Patient ID: Rebecca Roth, MRN: [id number]  Date: 04/01/20  Timespent: 45 minutes  Treatment Type: Individual  Mental Status Exam:   Appearance:   Casual   Behavior:  Appropriate  Motor:  Normal  Speech/Language:   Normal Rate  Affect:  Full Range  Mood:  Depressed, tearful  Thought process:  Coherent and Relevant  Thought content:   Logical  Perceptual disturbances:   Normal  Orientation:  Full (Time, Place, and Person)  Attention:  Good  Concentration:  good  Memory:  Immediate  Fund of knowledge:   Good  Insight:   Good  Judgment:   Good  Impulse Control:  good    Reported Symptoms: Depressed mood, anxiety, hopelessness and helplessness, isolated behavior, deficits with attention and concentration, history of panic attacks  Risk Assessment: Danger to Self: No Self-injurious Behavior: No Danger to Others: No Duty to Warn:no Physical Aggression / Violence:No  Access to Firearms a concern: No  Gang Involvement:No  Patient / guardian was educated about steps to take if suicide or homicide risk level increases between visits. While future psychiatric events cannot be accurately predicted, the patient does not currently require acute inpatient psychiatric care and does not currently meet [facility] involuntary commitment criteria.  Subjective: Patient arrived on time for today's session, in no distress.  She said she has had some increased anxiety, obsessional thinking, counting. She's unsure of the catalyst for this recent behavior, sharing details; It's been interfering at times with her ability to concentrate and focus on other things.  We discussed diaphragmatic breathing with mindfulness exercises to utilize between sessions. She plans to discuss with Lowell Guitar, NP at her coming medication management appointment.  Other recent stressors were also identified, some continued relational  strain with her daughter but some recent improvement.  She said that her husband contracted COVID-19 as well as her son but both are doing well and have recovered.  Ways to continue to cope and care for herself were explored, making time with friends was encouraged.  She shared how her marriage continues to go well, as made some breakthroughs and realizing how compatible they are with the increased time together as she is in partial retirement and with her husband often.  Interventions:CBT, Solution Focused and Strength-based  Diagnosis:   ICD-10-CM   1. Generalized anxiety disorder  F41.1      Plan:  1.  Patient to continue to engage in individual counseling 2-4 times a month or as needed. 2.  Patient to identify and apply CBT, coping skills learned in session to decrease depression and anxiety symptoms. 3.  Patient to improve effective communication and assertiveness with others. 4.  Patient to contact this office, go to the local ED or call 911 if a crisis or emergency develops between visits.  Waldron Session, [facility]

## 2020-04-09 ENCOUNTER — Ambulatory Visit (HOSPITAL_COMMUNITY): Payer: 59 | Admitting: Anesthesiology

## 2020-04-09 ENCOUNTER — Other Ambulatory Visit: Payer: Self-pay

## 2020-04-09 ENCOUNTER — Encounter (HOSPITAL_COMMUNITY): Payer: Self-pay | Admitting: Ophthalmology

## 2020-04-09 ENCOUNTER — Encounter (HOSPITAL_COMMUNITY)
Admission: RE | Disposition: A | Discharge status: Home / Self Care | Payer: Self-pay | Source: Home / Self Care | Attending: Ophthalmology

## 2020-04-09 ENCOUNTER — Ambulatory Visit (HOSPITAL_COMMUNITY)
Admission: RE | Admit: 2020-04-09 | Discharge: 2020-04-09 | Disposition: A | Discharge status: Home / Self Care | Payer: 59 | Attending: Ophthalmology | Admitting: Ophthalmology

## 2020-04-09 DIAGNOSIS — Z20822 Contact with and (suspected) exposure to covid-19: Secondary | ICD-10-CM | POA: Insufficient documentation

## 2020-04-09 DIAGNOSIS — I10 Essential (primary) hypertension: Secondary | ICD-10-CM | POA: Diagnosis not present

## 2020-04-09 DIAGNOSIS — H33021 Retinal detachment with multiple breaks, right eye: Secondary | ICD-10-CM | POA: Insufficient documentation

## 2020-04-09 DIAGNOSIS — J45909 Unspecified asthma, uncomplicated: Secondary | ICD-10-CM | POA: Diagnosis not present

## 2020-04-09 HISTORY — DX: Essential (primary) hypertension: I10

## 2020-04-09 HISTORY — DX: Hypothyroidism, unspecified: E03.9

## 2020-04-09 HISTORY — PX: REPAIR OF COMPLEX TRACTION RETINAL DETACHMENT: SHX6217

## 2020-04-09 HISTORY — DX: Depression, unspecified: F32.A

## 2020-04-09 HISTORY — DX: Anxiety disorder, unspecified: F41.9

## 2020-04-09 LAB — RESPIRATORY PANEL BY RT PCR (FLU A&B, COVID)
Influenza A by PCR: NEGATIVE
Influenza B by PCR: NEGATIVE
SARS Coronavirus 2 by RT PCR: NEGATIVE

## 2020-04-09 LAB — GLUCOSE, CAPILLARY: Glucose-Capillary: 139 mg/dL — ABNORMAL HIGH (ref 70–99)

## 2020-04-09 SURGERY — REPAIR, RETINAL DETACHMENT, COMPLEX
Anesthesia: Monitor Anesthesia Care | Site: Eye | Laterality: Right

## 2020-04-09 MED ORDER — LIDOCAINE HCL 2 % IJ SOLN
INTRAMUSCULAR | Status: DC | PRN
  Administered 2020-04-09: 10 mL

## 2020-04-09 MED ORDER — ACETAMINOPHEN 160 MG/5ML PO SOLN: 1000.0000 mg | Freq: Once | ORAL | Status: DC | PRN

## 2020-04-09 MED ORDER — TOBRAMYCIN-DEXAMETHASONE 0.3-0.1 % OP OINT
TOPICAL_OINTMENT | OPHTHALMIC | Status: AC
  Filled 2020-04-09: qty 3.5

## 2020-04-09 MED ORDER — LIDOCAINE 2% (20 MG/ML) 5 ML SYRINGE
INTRAMUSCULAR | Status: AC
  Filled 2020-04-09: qty 5

## 2020-04-09 MED ORDER — MIDAZOLAM HCL 2 MG/2ML IJ SOLN
INTRAMUSCULAR | Status: AC
  Filled 2020-04-09: qty 2

## 2020-04-09 MED ORDER — CYCLOPENTOLATE HCL 1 % OP SOLN
1.0000 [drp] | OPHTHALMIC | Status: AC | PRN
  Administered 2020-04-09 (×3): 1 [drp] via OPHTHALMIC
  Filled 2020-04-09: qty 2

## 2020-04-09 MED ORDER — FENTANYL CITRATE (PF) 100 MCG/2ML IJ SOLN: 25.0000 ug | INTRAMUSCULAR | Status: DC | PRN

## 2020-04-09 MED ORDER — PROPOFOL 10 MG/ML IV BOLUS
INTRAVENOUS | Status: DC | PRN
  Administered 2020-04-09: 20 mg via INTRAVENOUS
  Administered 2020-04-09: 60 mg via INTRAVENOUS

## 2020-04-09 MED ORDER — HYALURONIDASE HUMAN 150 UNIT/ML IJ SOLN
INTRAMUSCULAR | Status: AC
  Filled 2020-04-09: qty 1

## 2020-04-09 MED ORDER — HYPROMELLOSE (GONIOSCOPIC) 2.5 % OP SOLN
OPHTHALMIC | Status: AC
  Filled 2020-04-09: qty 15

## 2020-04-09 MED ORDER — CEFAZOLIN SUBCONJUNCTIVAL INJECTION 100 MG/0.5 ML
INJECTION | SUBCONJUNCTIVAL | Status: DC | PRN
  Administered 2020-04-09: 200 mg via SUBCONJUNCTIVAL

## 2020-04-09 MED ORDER — TRYPAN BLUE 0.15 % OP SOLN
0.5000 mL | OPHTHALMIC | Status: DC
  Filled 2020-04-09: qty 0.5

## 2020-04-09 MED ORDER — CEFAZOLIN SUBCONJUNCTIVAL INJECTION 100 MG/0.5 ML
200.0000 mg | INJECTION | SUBCONJUNCTIVAL | Status: DC
  Filled 2020-04-09: qty 5

## 2020-04-09 MED ORDER — BSS IO SOLN
INTRAOCULAR | Status: DC | PRN
  Administered 2020-04-09: 15 mL via INTRAOCULAR

## 2020-04-09 MED ORDER — DEXAMETHASONE SODIUM PHOSPHATE 10 MG/ML IJ SOLN
INTRAMUSCULAR | Status: DC | PRN
  Administered 2020-04-09: 10 mg via INTRAVENOUS

## 2020-04-09 MED ORDER — LIDOCAINE HCL (CARDIAC) PF 100 MG/5ML IV SOSY
PREFILLED_SYRINGE | INTRAVENOUS | Status: DC | PRN
  Administered 2020-04-09: 50 mg via INTRATRACHEAL

## 2020-04-09 MED ORDER — PROPOFOL 10 MG/ML IV BOLUS
INTRAVENOUS | Status: AC
  Filled 2020-04-09: qty 20

## 2020-04-09 MED ORDER — BSS IO SOLN
INTRAOCULAR | Status: AC
  Filled 2020-04-09: qty 15

## 2020-04-09 MED ORDER — OXYCODONE HCL 5 MG/5ML PO SOLN: 5.0000 mg | Freq: Once | ORAL | Status: DC | PRN

## 2020-04-09 MED ORDER — TETRACAINE HCL 0.5 % OP SOLN
OPHTHALMIC | Status: AC
  Filled 2020-04-09: qty 4

## 2020-04-09 MED ORDER — ACETAMINOPHEN 10 MG/ML IV SOLN: 1000.0000 mg | Freq: Once | INTRAVENOUS | Status: DC | PRN

## 2020-04-09 MED ORDER — ATROPINE SULFATE 1 % OP SOLN
OPHTHALMIC | Status: AC
  Filled 2020-04-09: qty 5

## 2020-04-09 MED ORDER — HYALURONIDASE HUMAN NICU 150 UNIT/ML INJECTION
INTRAMUSCULAR | Status: DC | PRN
  Administered 2020-04-09: 150 [IU]

## 2020-04-09 MED ORDER — SODIUM CHLORIDE 0.9 % IV SOLN: INTRAVENOUS | Status: DC

## 2020-04-09 MED ORDER — BUPIVACAINE HCL (PF) 0.75 % IJ SOLN
INTRAMUSCULAR | Status: DC | PRN
  Administered 2020-04-09: 10 mL

## 2020-04-09 MED ORDER — HYPROMELLOSE (GONIOSCOPIC) 2.5 % OP SOLN
OPHTHALMIC | Status: DC | PRN
  Administered 2020-04-09: 1 [drp] via OPHTHALMIC

## 2020-04-09 MED ORDER — SODIUM CHLORIDE 0.9 % IV SOLN: INTRAVENOUS | Status: DC | PRN

## 2020-04-09 MED ORDER — EPINEPHRINE PF 1 MG/ML IJ SOLN: INTRAOCULAR | Status: DC | PRN

## 2020-04-09 MED ORDER — DEXAMETHASONE SODIUM PHOSPHATE 10 MG/ML IJ SOLN
INTRAMUSCULAR | Status: AC
  Filled 2020-04-09: qty 1

## 2020-04-09 MED ORDER — OXYCODONE HCL 5 MG PO TABS: 5.0000 mg | ORAL_TABLET | Freq: Once | ORAL | Status: DC | PRN

## 2020-04-09 MED ORDER — INDOCYANINE GREEN 25 MG IV SOLR
INTRAVENOUS | Status: AC
  Filled 2020-04-09: qty 10

## 2020-04-09 MED ORDER — LIDOCAINE HCL 2 % IJ SOLN
INTRAMUSCULAR | Status: AC
  Filled 2020-04-09: qty 20

## 2020-04-09 MED ORDER — ACETAMINOPHEN 500 MG PO TABS: 1000.0000 mg | ORAL_TABLET | Freq: Once | ORAL | Status: DC | PRN

## 2020-04-09 MED ORDER — BUPIVACAINE HCL (PF) 0.75 % IJ SOLN
INTRAMUSCULAR | Status: AC
  Filled 2020-04-09: qty 10

## 2020-04-09 MED ORDER — EPINEPHRINE PF 1 MG/ML IJ SOLN
INTRAMUSCULAR | Status: AC
  Filled 2020-04-09: qty 1

## 2020-04-09 MED ORDER — BSS PLUS IO SOLN
INTRAOCULAR | Status: AC
  Filled 2020-04-09: qty 500

## 2020-04-09 SURGICAL SUPPLY — 71 items
APL SWBSTK 6 STRL LF DISP (MISCELLANEOUS) ×1
APPLICATOR COTTON TIP 6 STRL (MISCELLANEOUS) ×1 IMPLANT
APPLICATOR COTTON TIP 6IN STRL (MISCELLANEOUS) ×2
BAND WRIST GAS GREEN (MISCELLANEOUS) IMPLANT
BLADE MVR KNIFE 20G (BLADE) IMPLANT
BLADE STAB KNIFE 15DEG (BLADE) IMPLANT
BNDG EYE OVAL (GAUZE/BANDAGES/DRESSINGS) ×1 IMPLANT
CABLE BIPOLOR RESECTION CORD (MISCELLANEOUS) ×1 IMPLANT
CANNULA ANT CHAM MAIN (OPHTHALMIC RELATED) IMPLANT
CANNULA DUAL BORE 23G (CANNULA) IMPLANT
CANNULA DUALBORE 25G (CANNULA) IMPLANT
CANNULA VLV SOFT TIP 25G (OPHTHALMIC) ×1 IMPLANT
CANNULA VLV SOFT TIP 25GA (OPHTHALMIC) ×2 IMPLANT
CAUTERY EYE LOW TEMP 1300F FIN (OPHTHALMIC RELATED) IMPLANT
CLSR STERI-STRIP ANTIMIC 1/2X4 (GAUZE/BANDAGES/DRESSINGS) ×2 IMPLANT
COVER MAYO STAND STRL (DRAPES) IMPLANT
DRAPE HALF SHEET 40X57 (DRAPES) ×2 IMPLANT
DRAPE INCISE 51X51 W/FILM STRL (DRAPES) ×1 IMPLANT
DRAPE RETRACTOR (MISCELLANEOUS) ×2 IMPLANT
ERASER HMR WETFIELD 23G BP (MISCELLANEOUS) IMPLANT
FORCEPS ECKARDT ILM 25G SERR (OPHTHALMIC RELATED) IMPLANT
FORCEPS GRIESHABER ILM 25G A (INSTRUMENTS) IMPLANT
GAS AUTO FILL CONSTEL (OPHTHALMIC) ×2
GAS AUTO FILL CONSTELLATION (OPHTHALMIC) IMPLANT
GAS WRIST BAND GREEN (MISCELLANEOUS)
GLOVE SURG SYN 7.5  E (GLOVE) ×2
GLOVE SURG SYN 7.5 E (GLOVE) ×1 IMPLANT
GLOVE SURG SYN 7.5 PF PI (GLOVE) ×1 IMPLANT
GOWN STRL REUS W/ TWL LRG LVL3 (GOWN DISPOSABLE) ×1 IMPLANT
GOWN STRL REUS W/TWL LRG LVL3 (GOWN DISPOSABLE) ×2
KIT BASIN OR (CUSTOM PROCEDURE TRAY) ×2 IMPLANT
KIT TURNOVER KIT B (KITS) ×2 IMPLANT
LENS BIOM SUPER VIEW SET DISP (MISCELLANEOUS) ×2 IMPLANT
MICROPICK 25G (MISCELLANEOUS)
NDL 18GX1X1/2 (RX/OR ONLY) (NEEDLE) ×1 IMPLANT
NDL 25GX 5/8IN NON SAFETY (NEEDLE) ×1 IMPLANT
NDL FILTER BLUNT 18X1 1/2 (NEEDLE) ×1 IMPLANT
NDL HYPO 25GX1X1/2 BEV (NEEDLE) IMPLANT
NDL HYPO 30X.5 LL (NEEDLE) ×2 IMPLANT
NDL RETROBULBAR 25GX1.5 (NEEDLE) ×1 IMPLANT
NEEDLE 18GX1X1/2 (RX/OR ONLY) (NEEDLE) ×2 IMPLANT
NEEDLE 25GX 5/8IN NON SAFETY (NEEDLE) ×2 IMPLANT
NEEDLE FILTER BLUNT 18X 1/2SAF (NEEDLE) ×1
NEEDLE FILTER BLUNT 18X1 1/2 (NEEDLE) ×1 IMPLANT
NEEDLE HYPO 25GX1X1/2 BEV (NEEDLE) IMPLANT
NEEDLE HYPO 30X.5 LL (NEEDLE) ×4 IMPLANT
NEEDLE RETROBULBAR 25GX1.5 (NEEDLE) ×4 IMPLANT
NS IRRIG 1000ML POUR BTL (IV SOLUTION) ×2 IMPLANT
PACK FRAGMATOME (OPHTHALMIC) IMPLANT
PACK VITRECTOMY CUSTOM (CUSTOM PROCEDURE TRAY) ×2 IMPLANT
PAD ARMBOARD 7.5X6 YLW CONV (MISCELLANEOUS) ×2 IMPLANT
PAK PIK VITRECTOMY CVS 25GA (OPHTHALMIC) ×1 IMPLANT
PENCIL BIPOLAR 25GA STR DISP (OPHTHALMIC RELATED) IMPLANT
PICK MICROPICK 25G (MISCELLANEOUS) IMPLANT
PROBE CONSTELLATION 25 GA PLUS (OPHTHALMIC) ×1 IMPLANT
PROBE ENDO DIATHERMY 25G (MISCELLANEOUS) ×1 IMPLANT
PROBE LASER ILLUM FLEX CVD 25G (OPHTHALMIC) ×1 IMPLANT
ROLLS DENTAL (MISCELLANEOUS) IMPLANT
SCRAPER DIAMOND 25GA (OPHTHALMIC RELATED) IMPLANT
SHIELD EYE LENSE ONLY DISP (GAUZE/BANDAGES/DRESSINGS) ×1 IMPLANT
SOL ANTI FOG 6CC (MISCELLANEOUS) ×1 IMPLANT
SOLUTION ANTI FOG 6CC (MISCELLANEOUS) ×1
STOPCOCK 4 WAY LG BORE MALE ST (IV SETS) IMPLANT
SUT VICRYL 7 0 TG140 8 (SUTURE) ×1 IMPLANT
SUT VICRYL 8 0 TG140 8 (SUTURE) IMPLANT
SYR 10ML LL (SYRINGE) IMPLANT
SYR 20ML LL LF (SYRINGE) ×2 IMPLANT
SYR 5ML LL (SYRINGE) ×2 IMPLANT
SYR TB 1ML LUER SLIP (SYRINGE) IMPLANT
WATER STERILE IRR 1000ML POUR (IV SOLUTION) ×2 IMPLANT
WIPE INSTRUMENT VISIWIPE 73X73 (MISCELLANEOUS) IMPLANT

## 2020-04-09 NOTE — Transfer of Care (Signed)
Immediate Anesthesia Transfer of Care Note  Patient: Rebecca Roth  Procedure(s) Performed: REPAIR OF COMPLEX TRACTION RETINAL DETACHMENT, 25g vitrectomy, endolaser (Right Eye)  Patient Location: PACU  Anesthesia Type:MAC  Level of Consciousness: awake, alert  and oriented  Airway & Oxygen Therapy: Patient Spontanous Breathing  Post-op Assessment: Report given to RN and Patient moving all extremities X 4  Post vital signs: Reviewed and stable  Last Vitals:  Vitals Value Taken Time  BP 140/75 04/09/20 2128  Temp    Pulse 77 04/09/20 2130  Resp 11 04/09/20 2130  SpO2 97 % 04/09/20 2130  Vitals shown include unvalidated device data.  Last Pain:  Vitals:   04/09/20 1927  TempSrc: Oral  PainSc: 0-No pain      Patients Stated Pain Goal: 0 (04/10/63 3837)  Complications: No apparent anesthesia complications

## 2020-04-09 NOTE — Brief Op Note (Signed)
04/09/2020  9:21 PM  PATIENT:  Rebecca Roth  59 y.o. 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   Delay start of Pharmacological VTE agent (>24hrs) due to surgical blood loss or risk of bleeding: not applicable

## 2020-04-09 NOTE — Op Note (Signed)
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  A lid speculum was placed.  Infusion line and trocar was placed at the 8 o'clock position approximately 3.5 mm from the surgical limbus.   The infusion line was allowed to run and then clamped when placed at the cannula opening. The line was inserted and secured to the drape with an adhesive strip.   Active trocars/cannula were placed at the 10 and 2 o'clock positions approximately 3.5 mm from the surgical limbus. The cannula was visualized in the vitreous cavity.  The light pipe and vitreous cutter were inserted into the vitreous cavity and a core vitrectomy was performed.  The vitreous was carefully shaved from the vitreous base with extra care taken to remove the vitreous over the retinal tear and areas of lattice degeneration.  Subretinal fluid was drained from the detachment through the hole.  A posterior retinotomy was made with the endocautery followed by the extrusion canula.    Endolaser was applied 360 degrees to the periphery.  A complete air fluid exchange was performed.  Additional subretinal fluid was drained from the posterior retinotomy to flatten the retinal detachment.  Additional endolaser was applied to the detached retina, including the breaks and lattice degeneration.  Laser was also applied surrounding the posterior retinotomy.  14% C3F8 gas was placed in the eye.  The superior cannulas were sequentially removed with concommitant tamponade using a cotton tipped applicator and noted to be air tight.  The infusion line  and trocar were removed and the sclerotomy was noted to be air tight with normal intraocular pressure by digital palpapation.  Subconjunctival injections of  Dexamethasone 4mg /68ml was placed in the infero-medial quadrant.   The speculum and drapes were removed and the eye was patched with Polymixin/Bacitracin ophthalmic ointment. An eye shield was placed and the patient was transferred alert and conversant with stable vital signs to the post operative recovery area.  The patient tolerated the procedure well and no complications were noted.  0m MD

## 2020-04-09 NOTE — H&P (Signed)
Date of examination:  04/09/20  Indication for surgery: Retinal detachment right eye  Pertinent past medical history:  Past Medical History:  Diagnosis Date  . Anemia   . Asthma   . Diabetes mellitus without complication (HCC)   . Frequent headaches   . GERD (gastroesophageal reflux disease)   . IBS (irritable bowel syndrome)   . Swelling   . 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 Stroke Mother   . Pancreatic cancer Father   . Kidney cancer Brother   . Emphysema Maternal Grandfather     General:  Healthy appearing patient in no distress.    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

## 2020-04-09 NOTE — Anesthesia Procedure Notes (Signed)
Procedure Name: MAC Date/Time: 04/09/2020 7:47 PM Performed by: Claris Che, CRNA Pre-anesthesia Checklist: Patient identified, Emergency Drugs available, Suction available, Patient being monitored and Timeout performed Patient Re-evaluated:Patient Re-evaluated prior to induction Oxygen Delivery Method: Nasal cannula

## 2020-04-09 NOTE — Anesthesia Preprocedure Evaluation (Signed)
Anesthesia Evaluation  Patient identified by MRN, date of birth, ID band Patient awake    Reviewed: Allergy & Precautions, NPO status , Patient's Chart, lab work & pertinent test results  History of Anesthesia Complications Negative for: history of anesthetic complications  Airway Mallampati: II  TM Distance: >3 FB Neck ROM: Full    Dental  (+) Dental Advisory Given, Teeth Intact   Pulmonary asthma , neg recent URI,    breath sounds clear to auscultation       Cardiovascular hypertension, Pt. on medications and Pt. 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, neg cath 2016   Neuro/Psych  Headaches, PSYCHIATRIC DISORDERS Anxiety Depression    GI/Hepatic Neg liver ROS, GERD  Medicated and Controlled,  Endo/Other  diabetesHypothyroidism   Renal/GU negative Renal ROS     Musculoskeletal   Abdominal   Peds  Hematology  (+) anemia ,   Anesthesia Other Findings   Reproductive/Obstetrics                             Anesthesia Physical Anesthesia Plan  ASA: II  Anesthesia Plan: MAC   Post-op Pain Management:    Induction: Intravenous  PONV Risk Score and Plan: 2 and Treatment may vary due to age or medical condition and Propofol infusion  Airway Management Planned: Nasal Cannula  Additional Equipment: None  Intra-op Plan:   Post-operative Plan:   Informed Consent: I have reviewed the patients History and Physical, chart, labs and discussed the procedure including the risks, benefits and alternatives for the proposed anesthesia with the patient or authorized representative who has indicated his/her understanding and acceptance.     Dental advisory given  Plan Discussed with: CRNA and Surgeon  Anesthesia Plan Comments:         Anesthesia Quick Evaluation

## 2020-04-09 NOTE — Progress Notes (Signed)
Dr. Maple Hudson notified of EKG.

## 2020-04-11 NOTE — Anesthesia Postprocedure Evaluation (Signed)
Anesthesia Post Note  Patient: Rebecca Roth  Procedure(s) Performed: REPAIR OF COMPLEX TRACTION RETINAL DETACHMENT, 25g vitrectomy, endolaser (Right Eye)     Patient location during evaluation: PACU Anesthesia Type: MAC Level of consciousness: awake and alert Pain management: pain level controlled Vital Signs Assessment: post-procedure vital signs reviewed and stable Respiratory status: spontaneous breathing, nonlabored ventilation, respiratory function stable and patient connected to nasal cannula oxygen Cardiovascular status: stable and blood pressure returned to baseline Postop Assessment: no apparent nausea or vomiting Anesthetic complications: no    Last Vitals:  Vitals:   04/09/20 2130 04/09/20 2145  BP: 140/75 (!) 124/52  Pulse: 80 78  Resp: (!) 21 12  Temp:  36.8 C  SpO2: 91% 95%    Last Pain:  Vitals:   04/09/20 2145  TempSrc:   PainSc: 0-No pain                 Lakeva Hollon

## 2020-04-12 ENCOUNTER — Ambulatory Visit: Payer: Self-pay | Admitting: Physician Assistant

## 2020-04-29 ENCOUNTER — Emergency Department (HOSPITAL_COMMUNITY): Payer: 59

## 2020-04-29 ENCOUNTER — Encounter (HOSPITAL_COMMUNITY): Payer: Self-pay

## 2020-04-29 ENCOUNTER — Other Ambulatory Visit: Payer: Self-pay

## 2020-04-29 ENCOUNTER — Emergency Department (HOSPITAL_COMMUNITY)
Admission: EM | Admit: 2020-04-29 | Discharge: 2020-04-30 | Disposition: A | Discharge status: Home / Self Care | Payer: 59 | Attending: Emergency Medicine | Admitting: Emergency Medicine

## 2020-04-29 DIAGNOSIS — Z794 Long term (current) use of insulin: Secondary | ICD-10-CM | POA: Insufficient documentation

## 2020-04-29 DIAGNOSIS — R9431 Abnormal electrocardiogram [ECG] [EKG]: Secondary | ICD-10-CM | POA: Diagnosis not present

## 2020-04-29 DIAGNOSIS — Z79899 Other long term (current) drug therapy: Secondary | ICD-10-CM | POA: Diagnosis not present

## 2020-04-29 DIAGNOSIS — J45909 Unspecified asthma, uncomplicated: Secondary | ICD-10-CM | POA: Insufficient documentation

## 2020-04-29 DIAGNOSIS — R0789 Other chest pain: Secondary | ICD-10-CM | POA: Insufficient documentation

## 2020-04-29 DIAGNOSIS — E039 Hypothyroidism, unspecified: Secondary | ICD-10-CM | POA: Diagnosis not present

## 2020-04-29 DIAGNOSIS — I10 Essential (primary) hypertension: Secondary | ICD-10-CM | POA: Diagnosis not present

## 2020-04-29 DIAGNOSIS — E119 Type 2 diabetes mellitus without complications: Secondary | ICD-10-CM | POA: Diagnosis not present

## 2020-04-29 LAB — BASIC METABOLIC PANEL
Anion gap: 16 — ABNORMAL HIGH (ref 5–15)
BUN: 26 mg/dL — ABNORMAL HIGH (ref 6–20)
CO2: 24 mmol/L (ref 22–32)
Calcium: 10.1 mg/dL (ref 8.9–10.3)
Chloride: 98 mmol/L (ref 98–111)
Creatinine, Ser: 1.11 mg/dL — ABNORMAL HIGH (ref 0.44–1.00)
GFR calc Af Amer: >60 mL/min (ref >60)
GFR calc non Af Amer: 54 mL/min — ABNORMAL LOW (ref >60)
Glucose, Bld: 255 mg/dL — ABNORMAL HIGH (ref 70–99)
Potassium: 4.2 mmol/L (ref 3.5–5.1)
Sodium: 138 mmol/L (ref 135–145)

## 2020-04-29 LAB — TROPONIN I (HIGH SENSITIVITY)
Troponin I (High Sensitivity): 5 ng/L (ref <18)
Troponin I (High Sensitivity): 5 ng/L (ref <18)

## 2020-04-29 LAB — CBC
HCT: 46.9 % — ABNORMAL HIGH (ref 36.0–46.0)
Hemoglobin: 14.4 g/dL (ref 12.0–15.0)
MCH: 26.7 pg (ref 26.0–34.0)
MCHC: 30.7 g/dL (ref 30.0–36.0)
MCV: 87 fL (ref 80.0–100.0)
Platelets: 397 10*3/uL (ref 150–400)
RBC: 5.39 MIL/uL — ABNORMAL HIGH (ref 3.87–5.11)
RDW: 14.9 % (ref 11.5–15.5)
WBC: 6.5 10*3/uL (ref 4.0–10.5)
nRBC: 0 % (ref 0.0–0.2)

## 2020-04-29 NOTE — ED Triage Notes (Signed)
Pt sent by doctor for further evaluation of abnormal EKG 2 weeks ago. Pt had retinal surgery and EKG was a preop test done. Pt having some chest tightness but no pain. Resp e.u, no other complaints

## 2020-04-29 NOTE — ED Provider Notes (Signed)
[facility] EMERGENCY DEPARTMENT Provider Note   CSN: [id number] Arrival date & time: 04/29/20  1709     History Chief Complaint  Patient presents with  . Abnormal ECG  . Chest Pain    Rebecca Roth is a 59 y.o. female.  Patient with a history of DM, GERD, HTN, IBS, anxiety, asthma, thyroid disease, peripheral edema (w/o dx CHF) presents on the advice of her doctor for evaluation of abnormal EKG. She underwent emergency eye surgery 2 weeks ago when the EKG was done but PCP did not get her report until today, hence the delay in her evaluation. She reports having chest tightness over the last several days, but not for the entire past 2 weeks. She has been in the bed recuperating from her surgery. Her chest tightness does not happen during the day, but occurs at night and is associated with SOB. She uses her Albuterol inhaler and states this offers relief. No cough, fever, sore throat, pleuritic chest pain, nausea or vomiting. She reports history of allergies but usual symptoms no worse over the last several days than usual. She takes Lasix daily for peripheral edema and reports no change in these symptoms.   The history is provided by the patient. No language interpreter was used.  Chest Pain Associated symptoms: shortness of breath   Associated symptoms: no abdominal pain, no cough, no fever, no headache, no nausea, no vomiting and no weakness        Past Medical History:  Diagnosis Date  . Anemia   . Anxiety   . Asthma   . Depression   . Diabetes mellitus without complication (HCC)   . Frequent headaches   . GERD (gastroesophageal reflux disease)   . Hypertension   . Hypothyroidism   . IBS (irritable bowel syndrome)   . Swelling   . Thyroid disease     Patient Active Problem List   Diagnosis Date Noted  . Major depressive disorder, recurrent episode, moderate (HCC) 09/17/2018  . Generalized anxiety disorder 09/17/2018  . Mixed obsessional thoughts  and acts 09/17/2018  . Type 2 diabetes, controlled, with neuropathy (HCC) 07/11/2014  . Hypothyroidism 07/11/2014  . IBS (irritable bowel syndrome) 07/11/2014  . Asthma 07/11/2014    Past Surgical History:  Procedure Laterality Date  . GALLBLADDER SURGERY    . 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   Procedure: REPAIR OF COMPLEX TRACTION RETINAL DETACHMENT, 25g vitrectomy, endolaser;  Surgeon: Carmela Rima, MD;  Location: [facility] OR;  Service: Ophthalmology;  Laterality: Right;     OB History   No obstetric history on file.     Family History  Problem Relation Age of Onset  . Stroke Mother   . Pancreatic cancer Father   . Kidney cancer Brother   . Emphysema Maternal Grandfather     Social History   Tobacco Use  . Smoking status: Never Smoker  . Smokeless tobacco: Never Used  Substance Use Topics  . Alcohol use: No  . Drug use: No    Home Medications Prior to Admission medications   Medication Sig Start Date End Date Taking? Authorizing Provider  atorvastatin (LIPITOR) 40 MG tablet Take 40 mg by mouth daily.    [provider]  BAYER CONTOUR TEST test strip  06/20/14   [provider]  BREO ELLIPTA 200-25 MCG/INH AEPB Inhale 1 puff into the lungs daily as needed (For shortness of breath).  05/21/18   [provider]  brexpiprazole (REXULTI) 2  MG TABS tablet Take 1 tablet (2 mg total) by mouth daily. 01/24/20   Melony Overly T, PA-C  busPIRone (BUSPAR) 30 MG tablet Take 1 tablet (30 mg total) by mouth 2 (two) times daily. 01/24/20   Cherie Ouch, PA-C  clonazePAM (KLONOPIN) 0.5 MG tablet Take 1 tablet (0.5 mg total) by mouth 2 (two) times daily as needed for anxiety. 12/19/19   Melony Overly T, PA-C  etodolac (LODINE) 200 MG capsule Take 200 mg by mouth 2 (two) times daily.    [provider]  fenofibrate 54 MG tablet Take 54 mg by mouth daily.  05/09/14   [provider]  FLUoxetine (PROZAC) 40 MG capsule  TAKE 2 CAPSULES BY MOUTH EVERY DAY Patient taking differently: Take 40 mg by mouth 2 (two) times daily.  03/28/20   Melony Overly T, PA-C  fluticasone (FLONASE) 50 MCG/ACT nasal spray Place 2 sprays into both nostrils daily. 05/27/18   Mannam, Colbert Coyer, MD  furosemide (LASIX) 40 MG tablet Take 40 mg by mouth daily.    [provider]  Insulin Pen Needle (SURE COMFORT PEN NEEDLES) 31G X 5 MM MISC Use as directed 02/14/20   Marianne Sofia, PA-C  lamoTRIgine (LAMICTAL) 200 MG tablet TAKE ONE TABLET BY MOUTH 2 TIMES A DAY Patient taking differently: Take 200 mg by mouth 2 (two) times daily.  12/08/19   Cherie Ouch, PA-C  Lancet Devices (SIMPLE DIAGNOSTICS LANCING DEV) MISC  05/04/14   [provider]  LEVEMIR FLEXTOUCH 100 UNIT/ML Pen Inject 80 Units into the skin 2 (two) times daily.  07/07/14   [provider]  levothyroxine (SYNTHROID, LEVOTHROID) 150 MCG tablet Take 150 mcg by mouth daily.  04/11/14   [provider]  montelukast (SINGULAIR) 10 MG tablet Take 10 mg by mouth at bedtime.  04/22/18   [provider]  omeprazole (PRILOSEC) 40 MG capsule TAKE ONE CAPSULE BY MOUTH 2 TIMES A DAY Patient taking differently: Take 40 mg by mouth in the morning and at bedtime.  10/14/18   Mannam, Colbert Coyer, MD  propranolol ER (INDERAL LA) 60 MG 24 hr capsule TAKE 1 CAPSULE BY MOUTH EVERY DAY Patient taking differently: Take 60 mg by mouth daily.  03/04/20   Cox, Kirsten, MD  SYNJARDY XR 25-1000 MG TB24 Take 1 tablet by mouth daily.  10/13/19   [provider]  valsartan (DIOVAN) 80 MG tablet Take 1 tablet (80 mg total) by mouth daily. 03/29/20   Cox, Fritzi Mandes, MD  Vitamin D, Ergocalciferol, (DRISDOL) 50000 UNITS CAPS capsule Take 50,000 Units by mouth every 7 (seven) days.  07/07/14   [provider]    Allergies    Ace inhibitors and Sulfa antibiotics  Review of Systems   Review of Systems  Constitutional: Positive for activity change. Negative for chills  and fever.  HENT: Negative.   Respiratory: Positive for chest tightness and shortness of breath. Negative for cough and wheezing.   Cardiovascular: Positive for leg swelling (No worse than usual.).  Gastrointestinal: Negative.  Negative for abdominal pain, nausea and vomiting.  Musculoskeletal: Negative.   Skin: Negative.   Neurological: Negative.  Negative for weakness and headaches.    Physical Exam Updated Vital Signs BP 120/80   Pulse 96   Temp 98.3 F (36.8 C) (Oral)   Resp 17   Ht 5\' 6"  (1.676 m)   Wt 113.4 kg   SpO2 95%   BMI 40.35 kg/m   Physical Exam Vitals and nursing note reviewed.  Constitutional:      Appearance: She is well-developed.  HENT:     Head: Normocephalic.  Neck:     Vascular: No carotid bruit.  Cardiovascular:     Rate and Rhythm: Normal rate and regular rhythm.     Heart sounds: No murmur.  Pulmonary:     Effort: Pulmonary effort is normal.     Breath sounds: Normal breath sounds. No wheezing, rhonchi or rales.  Abdominal:     General: Bowel sounds are normal.     Palpations: Abdomen is soft.     Tenderness: There is no abdominal tenderness. There is no guarding or rebound.  Musculoskeletal:        General: No tenderness. Normal range of motion.     Cervical back: Normal range of motion and neck supple.  Skin:    General: Skin is warm and dry.     Findings: No rash.  Neurological:     Mental Status: She is alert and oriented to person, place, and time.     ED Results / Procedures / Treatments   Labs (all labs ordered are listed, but only abnormal results are displayed) Labs Reviewed  BASIC METABOLIC PANEL - Abnormal; Notable for the following components:      Result Value   Glucose, Bld 255 (*)    BUN 26 (*)    Creatinine, Ser 1.11 (*)    GFR calc non Af Amer 54 (*)    Anion gap 16 (*)    All other components within normal limits  CBC - Abnormal; Notable for the following components:   RBC 5.39 (*)    HCT 46.9 (*)    All  other components within normal limits  TROPONIN I (HIGH SENSITIVITY)  TROPONIN I (HIGH SENSITIVITY)   Results for orders placed or performed during the hospital encounter of 04/29/20  Basic metabolic panel  Result Value Ref Range   Sodium 138 135 - 145 mmol/L   Potassium 4.2 3.5 - 5.1 mmol/L   Chloride 98 98 - 111 mmol/L   CO2 24 22 - 32 mmol/L   Glucose, Bld 255 (H) 70 - 99 mg/dL   BUN 26 (H) 6 - 20 mg/dL   Creatinine, Ser 9.52 (H) 0.44 - 1.00 mg/dL   Calcium 84.1 8.9 - 32.4 mg/dL   GFR calc non Af Amer 54 (L) >60 mL/min   GFR calc Af Amer >60 >60 mL/min   Anion gap 16 (H) 5 - 15  CBC  Result Value Ref Range   WBC 6.5 4.0 - 10.5 K/uL   RBC 5.39 (H) 3.87 - 5.11 MIL/uL   Hemoglobin 14.4 12.0 - 15.0 g/dL   HCT 40.1 (H) 02.7 - 25.3 %   MCV 87.0 80.0 - 100.0 fL   MCH 26.7 26.0 - 34.0 pg   MCHC 30.7 30.0 - 36.0 g/dL   RDW 66.4 40.3 - 47.4 %   Platelets 397 150 - 400 K/uL   nRBC 0.0 0.0 - 0.2 %  Troponin I (High Sensitivity)  Result Value Ref Range   Troponin I (High Sensitivity) 5 <18 ng/L  Troponin I (High Sensitivity)  Result Value Ref Range   Troponin I (High Sensitivity) 5 <18 ng/L    EKG None  Radiology DG Chest 2 View  Result Date: 04/29/2020 CLINICAL DATA:  Chest pain EXAM: CHEST - 2 VIEW COMPARISON:  05/27/2018 FINDINGS: The heart size and mediastinal contours are within normal limits. Both lungs are clear. The visualized skeletal structures are  unremarkable. IMPRESSION: No active cardiopulmonary disease. Electronically Signed   By: Donavan Foil M.D.   On: 04/29/2020 18:27    Procedures Procedures (including critical care time)  Medications Ordered in ED Medications - No data to display  ED Course  I have reviewed the triage vital signs and the nursing notes.  Pertinent labs & imaging results that were available during my care of the patient were reviewed by me and considered in my medical decision making (see chart for details).    MDM  Rules/Calculators/A&P                      Patient to ED on the advice of her doctor for evaluation of an abnormal EKG as detailed in the HPI.   The patient is well appearing. EKG does show changes from previous, however, she is considered at low risk for acute coronary syndrome based on duration of time since EKG done, patient's described symptoms and negative evaluation here tonight, including troponin x 2. The importance of cardiology follow up is stressed and patient is felt reliable for this follow up.   Final Clinical Impression(s) / ED Diagnoses Final diagnoses:  None   1. Abnormal EKG  Rx / DC Orders ED Discharge Orders    None       Charlann Lange, PA-C 04/30/20 0108    Merryl Hacker, MD 04/30/20 [phone]

## 2020-04-30 MED ORDER — ALBUTEROL SULFATE HFA 108 (90 BASE) MCG/ACT IN AERS: 2.0000 | INHALATION_SPRAY | RESPIRATORY_TRACT | Status: DC | PRN

## 2020-04-30 NOTE — Discharge Instructions (Addendum)
It is recommended that you follow up with cardiology for further outpatient evaluation of your abnormal EKG. Your evaluation in the emergency department tonight is reassuring. Please make an appointment with cardiology as allowed by your post-surgical course from eye surgery.   If you develop new or concerning symptoms, please return to the emergency department for further evaluation.

## 2020-04-30 NOTE — ED Notes (Signed)
Patient verbalizes understanding of discharge instructions. Opportunity for questioning and answers were provided. Armband removed by staff, pt discharged from ED.  

## 2020-05-01 ENCOUNTER — Other Ambulatory Visit: Payer: Self-pay | Admitting: Physician Assistant

## 2020-05-01 ENCOUNTER — Other Ambulatory Visit: Payer: Self-pay | Admitting: Family Medicine

## 2020-05-02 NOTE — Telephone Encounter (Signed)
Patient was just in the ED and was sent by her PCP the note said. I am concerned she has established elsewhere. Please reach out to her and let her know we would like to take care of her, but she needs an appointment for follow up prior to refilling her medicines. Kc

## 2020-05-02 NOTE — Telephone Encounter (Signed)
Refill request

## 2020-05-03 ENCOUNTER — Ambulatory Visit: Payer: Self-pay | Admitting: Physician Assistant

## 2020-05-03 ENCOUNTER — Other Ambulatory Visit: Payer: Self-pay | Admitting: Family Medicine

## 2020-05-06 ENCOUNTER — Other Ambulatory Visit: Payer: Self-pay | Admitting: Physician Assistant

## 2020-05-06 MED ORDER — ATORVASTATIN CALCIUM 40 MG PO TABS: 40.0000 mg | ORAL_TABLET | Freq: Every day | ORAL | 2 refills | Status: AC

## 2020-05-22 ENCOUNTER — Other Ambulatory Visit: Payer: Self-pay | Admitting: Physician Assistant

## 2020-06-04 ENCOUNTER — Other Ambulatory Visit: Payer: Self-pay | Admitting: Family Medicine

## 2020-06-07 ENCOUNTER — Ambulatory Visit: Payer: Self-pay | Admitting: Physician Assistant

## 2020-06-07 ENCOUNTER — Ambulatory Visit: Payer: 59 | Admitting: Mental Health

## 2020-06-17 ENCOUNTER — Other Ambulatory Visit: Payer: Self-pay | Admitting: Family Medicine

## 2020-06-18 ENCOUNTER — Other Ambulatory Visit: Payer: Self-pay

## 2020-06-20 ENCOUNTER — Telehealth: Payer: Self-pay | Admitting: Physician Assistant

## 2020-06-20 ENCOUNTER — Other Ambulatory Visit: Payer: Self-pay | Admitting: Physician Assistant

## 2020-06-20 MED ORDER — LAMOTRIGINE 200 MG PO TABS: ORAL_TABLET | ORAL | 1 refills | Status: DC

## 2020-06-20 NOTE — Telephone Encounter (Signed)
Pt called to ask if Rebecca Roth can send in her Lamictal for her. Says her prior PCP used to send it in for her. Pharmacy needs  Rebecca Roth to RX it now. She is on Lamictal 200mg   1 bid.  [facility] [address] [place] [address]

## 2020-06-20 NOTE — Telephone Encounter (Signed)
I sent in a prescription for Lamictal.

## 2020-06-21 ENCOUNTER — Other Ambulatory Visit: Payer: Self-pay | Admitting: Physician Assistant

## 2020-06-24 NOTE — Pre-Procedure Instructions (Signed)
Halston Fairclough Robers  06/24/2020      [id number] Rosalita Levan, [place] - [address] [place] [address] Phone: [phone] Fax: [phone]   Your procedure is scheduled on Friday, June 28, 2020  Report to [facility] Admitting at 5:30 A.M.  Call this number if you have problems the morning of surgery:  [phone]   Remember: Brush your teeth the morning of surgery with your regular toothpaste.  Do not eat or drink after midnight the night before surgery.    Take these medicines the morning of surgery with A SIP OF WATER :   atorvastatin (LIPITOR)  brexpiprazole (REXULTI)  busPIRone (BUSPAR)   Fenofibrate  FLUoxetine (PROZAC)   lamoTRIgine (LAMICTAL)  levocetirizine (XYZAL)  levothyroxine (SYNTHROID)  pantoprazole (PROTONIX)  propranolol ER (INDERAL LA) If needed: clonazePAM (KLONOPIN) for anxiety If needed: BREO ELLIPTA inhaler for shortness of breath If needed: albuterol (VENTOLIN HFA) inhaler for wheezing and shortness of breath ( Bring inhaler(s) in with you).  Stop taking Aspirin (unless otherwise advised by surgeon), vitamins, fish oil and herbal medications. Do not take any NSAIDs ie: etodolac (LODINE)  Ibuprofen, Advil, Naproxen (Aleve), Motrin, BC and Goody Powder; stop now.    How to Manage Your Diabetes Before and After Surgery  Why is it important to control my blood sugar before and after surgery? . Improving blood sugar levels before and after surgery helps healing and can limit problems. . A way of improving blood sugar control is eating a healthy diet by: o  Eating less sugar and carbohydrates o  Increasing activity/exercise o  Talking with your doctor about reaching your blood sugar goals . High blood sugars (greater than 180 mg/dL) can raise your risk of infections and slow your recovery, so you will need to focus on controlling your diabetes during the weeks before surgery. . Make sure that the doctor who  takes care of your diabetes knows about your planned surgery including the date and location.  How do I manage my blood sugar before surgery? . Check your blood sugar at least 4 times a day, starting 2 days before surgery, to make sure that the level is not too high or low. o Check your blood sugar the morning of your surgery when you wake up and every 2 hours until you get to the Short Stay unit. . If your blood sugar is less than 70 mg/dL, you will need to treat for low blood sugar: o Do not take insulin. o Treat a low blood sugar (less than 70 mg/dL) with  cup of clear juice (cranberry or apple), 4 glucose tablets, OR glucose gel. Recheck blood sugar in 15 minutes after treatment (to make sure it is greater than 70 mg/dL). If your blood sugar is not greater than 70 mg/dL on recheck, call [phone] o  for further instructions. . Report your blood sugar to the short stay nurse when you get to Short Stay.  . If you are admitted to the hospital after surgery: o Your blood sugar will be checked by the staff and you will probably be given insulin after surgery (instead of oral diabetes medicines) to make sure you have good blood sugar levels. o The goal for blood sugar control after surgery is 80-180 mg/dL.     WHAT DO I DO ABOUT MY DIABETES MEDICATION?   Marland Kitchen Do not take SYNJARDY XR the day before surgery and  the morning of surgery.  Marland Kitchen THE  NIGHT BEFORE SURGERY, take 40 units of  LEVEMIR  insulin.    . THE MORNING OF SURGERY, take 40 units of  LEVEMIR  Insulin if CBG is greater than 70.   . The day of surgery, do not take  Trulicity (dulaglutide).   Reviewed and Endorsed by [facility] Patient Education Committee, August 2015   Do not wear jewelry, make-up or nail polish.  Do not wear lotions, powders, or perfumes, or deodorant.  Do not shave 48 hours prior to surgery.    Do not bring valuables to the hospital.  [facility] is not responsible for any belongings or  valuables.  Contacts, dentures or bridgework may not be worn into surgery.  Leave your suitcase in the car.  After surgery it may be brought to your room.  For patients admitted to the hospital, discharge time will be determined by your treatment team.  Patients discharged the day of surgery will not be allowed to drive home.   Special instructions: See " [facility] Preparing For Surgery " sheet.  Please read over the following fact sheets that you were given. Pain Booklet, Coughing and Deep Breathing and Surgical Site Infection Prevention

## 2020-06-25 ENCOUNTER — Other Ambulatory Visit: Payer: Self-pay

## 2020-06-25 ENCOUNTER — Other Ambulatory Visit (HOSPITAL_COMMUNITY)
Admission: RE | Admit: 2020-06-25 | Discharge: 2020-06-25 | Disposition: A | Discharge status: Home / Self Care | Payer: 59 | Source: Ambulatory Visit | Attending: Ophthalmology | Admitting: Ophthalmology

## 2020-06-25 ENCOUNTER — Encounter (HOSPITAL_COMMUNITY)
Admission: RE | Admit: 2020-06-25 | Discharge: 2020-06-25 | Disposition: A | Discharge status: Home / Self Care | Payer: 59 | Source: Ambulatory Visit | Attending: Ophthalmology | Admitting: Ophthalmology

## 2020-06-25 ENCOUNTER — Encounter (HOSPITAL_COMMUNITY): Payer: Self-pay

## 2020-06-25 DIAGNOSIS — Z20822 Contact with and (suspected) exposure to covid-19: Secondary | ICD-10-CM | POA: Diagnosis not present

## 2020-06-25 DIAGNOSIS — Z01812 Encounter for preprocedural laboratory examination: Secondary | ICD-10-CM | POA: Diagnosis not present

## 2020-06-25 HISTORY — DX: Unspecified cataract: H26.9

## 2020-06-25 HISTORY — DX: Presence of spectacles and contact lenses: Z97.3

## 2020-06-25 HISTORY — DX: Disorder of arteries and arterioles, unspecified: I77.9

## 2020-06-25 HISTORY — DX: Atherosclerotic heart disease of native coronary artery without angina pectoris: I25.10

## 2020-06-25 LAB — CBC
HCT: 38.1 % (ref 36.0–46.0)
Hemoglobin: 11.8 g/dL — ABNORMAL LOW (ref 12.0–15.0)
MCH: 27.1 pg (ref 26.0–34.0)
MCHC: 31 g/dL (ref 30.0–36.0)
MCV: 87.4 fL (ref 80.0–100.0)
Platelets: 327 10*3/uL (ref 150–400)
RBC: 4.36 MIL/uL (ref 3.87–5.11)
RDW: 14.6 % (ref 11.5–15.5)
WBC: 6.3 10*3/uL (ref 4.0–10.5)
nRBC: 0 % (ref 0.0–0.2)

## 2020-06-25 LAB — BASIC METABOLIC PANEL
Anion gap: 11 (ref 5–15)
BUN: 12 mg/dL (ref 6–20)
CO2: 22 mmol/L (ref 22–32)
Calcium: 9 mg/dL (ref 8.9–10.3)
Chloride: 106 mmol/L (ref 98–111)
Creatinine, Ser: 1.07 mg/dL — ABNORMAL HIGH (ref 0.44–1.00)
GFR calc Af Amer: >60 mL/min (ref >60)
GFR calc non Af Amer: 56 mL/min — ABNORMAL LOW (ref >60)
Glucose, Bld: 226 mg/dL — ABNORMAL HIGH (ref 70–99)
Potassium: 4 mmol/L (ref 3.5–5.1)
Sodium: 139 mmol/L (ref 135–145)

## 2020-06-25 LAB — SARS CORONAVIRUS 2 (TAT 6-24 HRS): SARS Coronavirus 2: NEGATIVE

## 2020-06-25 LAB — GLUCOSE, CAPILLARY: Glucose-Capillary: 222 mg/dL — ABNORMAL HIGH (ref 70–99)

## 2020-06-25 NOTE — Progress Notes (Signed)
Pt denies SOB, chest pain, and being under the care of a cardiologist. Pt stated that she is under the care of Wenda Low, NP, PCP. Pt denies having a stress test, echo and cardiac cath. Pt denies recent labs. Pt stated that last A1c was in March. Pt reminded to quarantine. Pt verbalized understanding of all pre-op instructions. Pt chart forwarded to PA, Anesthesiology, for review.

## 2020-06-26 ENCOUNTER — Encounter (HOSPITAL_COMMUNITY): Payer: Self-pay

## 2020-06-26 LAB — HEMOGLOBIN A1C
Hgb A1c MFr Bld: 9.1 % — ABNORMAL HIGH (ref 4.8–5.6)
Mean Plasma Glucose: 214 mg/dL

## 2020-06-26 NOTE — Progress Notes (Signed)
Anesthesia Chart Review:  Case: [id number] Date/Time: 06/28/20 0715   Procedure: CATARACT EXTRACTION PHACO AND INTRAOCULAR LENS PLACEMENT (IOC) (Right )   Anesthesia type: Monitor Anesthesia Care   Pre-op diagnosis:      RIGHT CATARACT SECONDARY TO OCULAR DISORDERS     COMPLICATED CATARACT   Location: [place] OR ROOM 08 / [place] OR   Surgeons: Jalene Mullet, MD      DISCUSSION: Patient is a 59 year old female scheduled for the above procedure. She says she will need left cataract extraction as well in the future.  History includes never smoker, DM2, CAD (50% mid CX, o/w normal, medical therapy 2016), carotid artery stenosis (91-47% RICAS, < 82% LICAS 9562), asthma, IBS, GERD, HTN, hypothyroidism, anemia, headaches. S/p repair of right complex retinal detachment on 04/09/20. BMI is consistent with obesity.   I called and spoke with patient regarding 04/29/20 ED visit. She was a direct admit for right eye retinal detachment on 04/09/20. Preoperative EKG showed SR, RBBB, LAFB, bifascicular block, can't rule out inferior infarct. She went on to have the surgery without incident. Apparently, EKG was routed to PCP but not received until 04/09/20. Patient says she got a call from her PCP office indicating that based on the EKG results she should go to the ED for further evaluation and if symptomatic to contact EMS. Patient said she was not having symptoms other than allergy exacerbation and had to use her albuterol for wheezing for a few days--otherwise she denied chest pain, chest tightness (although ED provider described symptoms as chest tightness at night relieved by albuterol), SOB, cough, fever.  EKG repeated x2 in the ED and showed worsening r wave progression, but provider felt overall stable. High sensitivity Troponin I negative x2. Edema stable on Lasix. She was felt low risk for ACS, but advised out-patient follow-up. Patient reports that activity has been limited per physician orders after retinal surgery, but  prior to that she denied SOB and chest pain. Says she can vacuum, sweep, go up 19 stairs to her laundry room without CV symptoms. No orthopnea. Reported allergy symptoms resolved and not currently requiring albuterol.  She she feels at her usual baseline. We discussed EKG (potential for future worsening conduction issues that could require future cardiology evaluation) and her A1c of 9.1% which she says is up for her, but reports compliance with medication (activity less since on restrictions). We also discussed 2017 carotid duplex findings. She was advised to follow-up with her PCP in the near future to discuss DM and inquire if/when timing of carotid US follow-up. She denied history of CVA. (I will forward my note to her PCP Leroy Sea, Georgeann Oppenheim, NP.)   As above, she denied CV symptoms and was recently able to go up flight of stair and clear her house without CV symptoms. 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 She tolerated eye surgery within the past three months. Case is posted for MAC anesthesia. Discussed with anesthesiologist Oren Bracket, MD. If CBG acceptable and otherwise no acute changes then it is anticipated that she can proceed as planned.    06/25/20 presurgical COVID-19 test negative. Anesthesia team to evaluate on the day of surgery.     VS: BP 128/63   Pulse 84   Temp 36.9 C   Resp 18   Ht _0  (1.676 m)   Wt 108.8 kg   SpO2 97%  BMI 38.70 kg/m     PROVIDERS: Earlyne Iba, NP is PCP [facility])   LABS: Preoperative labs noted. A1c elevated at 9.1%. Results routed to Dr. Posey Pronto and will be forwarded to her PCP for follow-up purposes. (all labs ordered are listed, but only abnormal results are displayed)  Labs Reviewed  GLUCOSE, CAPILLARY - Abnormal; Notable for the following components:      Result Value   Glucose-Capillary 222 (*)     All other components within normal limits  HEMOGLOBIN A1C - Abnormal; Notable for the following components:   Hgb A1c MFr Bld 9.1 (*)    All other components within normal limits  BASIC METABOLIC PANEL - Abnormal; Notable for the following components:   Glucose, Bld 226 (*)    Creatinine, Ser 1.07 (*)    GFR calc non Af Amer 56 (*)    All other components within normal limits  CBC - Abnormal; Notable for the following components:   Hemoglobin 11.8 (*)    All other components within normal limits     IMAGES: CXR 04/29/20: FINDINGS: The heart size and mediastinal contours are within normal limits. Both lungs are clear. The visualized skeletal structures are unremarkable. IMPRESSION: No active cardiopulmonary disease.    EKG: EKG 04/29/20 23:40:55: Sinus rhythm Right bundle branch block Inferior infarct, old Lateral leads are also involved No significant change since last tracing Confirmed by Thayer Jew [phone]) on 04/29/2020 11:45:14 PM Also confirmed by Thayer Jew [phone]), editor Victory Dakin [phone]) on 04/30/2020 7:51:43 AM  EKG 04/29/20 17:34:28:  Sinus tachycardia at 108 bpm Right bundle branch block Inferior infarct , age undetermined Anterolateral infarct , age undetermined Abnormal ECG Confirmed by Ripley Fraise [phone]) on 04/30/2020 8:54:04 AM  EKG 04/09/20: Normal sinus rhythm Right bundle branch block Left anterior fascicular block ** Bifascicular block ** Cannot rule out Inferior infarct , age undetermined Abnormal ECG No previous ECGs available Confirmed by End, Harrell Gave [phone]) on 04/10/2020 9:51:58 PM  (Per Care Everywhere, 06/29/16 EKG: ST, LAFB, poor r wave progression)    CV: Carotid US 07/06/16 [facility], copy in [facility]/PACS): IMPRESSION: Minor carotid atherosclerosis and tortuosity bilaterally. Right ICA moderate stenosis estimated at 50- 69% Left ICA narrowing less than 50% Patent antegrade vertebral flow  bilaterally  Cardiac cath 12/04/15 [facility] [place]): Angiographic findings  Cardiac Arteries and Lesion Findings  LMCA: Normal.  LAD: Normal.  LCx: Single stenosis.   Lesion on Prox CX: Mid subsection. 50% stenosis 12 mm length. Pre procedure   TIMI III flow was noted. Good run off was present. The lesion was diagnosed   as Low Risk (A). The lesion was tubular and concentric.The lesion showed   evidence of not present thrombus, with smooth contour, mild angulation and   moderate tortuosity.  RCA: Normal.  Ramus: Normal.  Conclusions  Non-obstructive coronary artery disease Circ. Stable lesion angiographically.  Normal LV systolic function.  Recommendations: Recommend work up for non-cardiac causes of symptoms.  Medical therapy for CAD Risk factor reduction.  Circ lesion does not appear to be an ACS culprit.     Past Medical History:  Diagnosis Date  . Anemia   . Anxiety   . Asthma   . Carotid artery disease ([place])    Carotid US 07/06/16 [facility]): 50-69% RICA stenosis, < 62% LICA stenosis   . Cataract    right eye  . Coronary artery disease    50% mid LCX, otherwise normal coronaries, medical therapy 12/04/15 (  [place])  . Depression   . Diabetes mellitus without complication ([place])   . Frequent headaches   . GERD (gastroesophageal reflux disease)   . Hypertension   . Hypothyroidism   . IBS (irritable bowel syndrome)   . Swelling   . Thyroid disease   . Wears glasses     Past Surgical History:  Procedure Laterality Date  . APPENDECTOMY    . CHOLECYSTECTOMY    . GALLBLADDER SURGERY    . 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   Procedure: REPAIR OF COMPLEX TRACTION RETINAL DETACHMENT, 25g vitrectomy, endolaser;  Surgeon: Jalene Mullet, MD;  Location: [place];  Service: Ophthalmology;  Laterality: Right;  . WISDOM TOOTH EXTRACTION      MEDICATIONS: . albuterol (VENTOLIN HFA) 108 (90 Base) MCG/ACT inhaler  . aspirin EC 81  MG tablet  . atorvastatin (LIPITOR) 40 MG tablet  . BAYER CONTOUR TEST test strip  . BREO ELLIPTA 200-25 MCG/INH AEPB  . brexpiprazole (REXULTI) 2 MG TABS tablet  . busPIRone (BUSPAR) 30 MG tablet  . clonazePAM (KLONOPIN) 0.5 MG tablet  . etodolac (LODINE) 400 MG tablet  . fenofibrate 54 MG tablet  . FLUoxetine (PROZAC) 40 MG capsule  . furosemide (LASIX) 40 MG tablet  . Insulin Pen Needle (SURE COMFORT PEN NEEDLES) 31G X 5 MM MISC  . lamoTRIgine (LAMICTAL) 200 MG tablet  . Lancet Devices (SIMPLE DIAGNOSTICS LANCING DEV) MISC  . LEVEMIR FLEXTOUCH 100 UNIT/ML Pen  . levocetirizine (XYZAL) 5 MG tablet  . levothyroxine (SYNTHROID) 137 MCG tablet  . montelukast (SINGULAIR) 10 MG tablet  . omeprazole (PRILOSEC) 40 MG capsule  . pantoprazole (PROTONIX) 40 MG tablet  . propranolol ER (INDERAL LA) 60 MG 24 hr capsule  . SYNJARDY XR 25-1000 MG TB24  . TRULICITY 7.91 TA/5.6PV SOPN  . valsartan (DIOVAN) 80 MG tablet  . Vitamin D, Ergocalciferol, (DRISDOL) 50000 UNITS CAPS capsule   No current facility-administered medications for this encounter.    Myra Gianotti, PA-C Surgical Short Stay/Anesthesiology [facility] Phone [phone] [facility] Phone [phone] 06/26/2020 11:12 AM

## 2020-06-26 NOTE — Anesthesia Preprocedure Evaluation (Addendum)
Anesthesia Evaluation  Patient identified by MRN, date of birth, ID band Patient awake    Reviewed: Allergy & Precautions, NPO status , Patient's Chart, lab work & pertinent test results  Airway Mallampati: III  TM Distance: >3 FB Neck ROM: Full    Dental  (+) Teeth Intact, Dental Advisory Given   Pulmonary asthma ,    Pulmonary exam normal breath sounds clear to auscultation       Cardiovascular hypertension, Pt. on home beta blockers and Pt. on medications + CAD  Normal cardiovascular exam Rhythm:Regular Rate:Normal     Neuro/Psych  Headaches, PSYCHIATRIC DISORDERS Anxiety Depression    GI/Hepatic Neg liver ROS, GERD  ,  Endo/Other  diabetesHypothyroidism   Renal/GU negative Renal ROS     Musculoskeletal negative musculoskeletal ROS (+)   Abdominal (+) + obese,   Peds  Hematology  (+) Blood dyscrasia, anemia ,   Anesthesia Other Findings   Reproductive/Obstetrics negative OB ROS                                                            Anesthesia Evaluation  Patient identified by MRN, date of birth, ID band Patient awake    Reviewed: Allergy & Precautions, NPO status , Patient's Chart, lab work & pertinent test results  History of Anesthesia Complications Negative for: history of anesthetic complications  Airway Mallampati: II  TM Distance: >3 FB Neck ROM: Full    Dental  (+) Dental Advisory Given, Teeth Intact   Pulmonary asthma , neg recent URI,    breath sounds clear to auscultation       Cardiovascular hypertension, Pt. on medications and Pt. 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, neg cath 2016   Neuro/Psych  Headaches, PSYCHIATRIC DISORDERS Anxiety Depression    GI/Hepatic Neg liver ROS, GERD  Medicated and Controlled,  Endo/Other  diabetesHypothyroidism   Renal/GU negative  Renal ROS     Musculoskeletal   Abdominal   Peds  Hematology  (+) anemia ,   Anesthesia Other Findings   Reproductive/Obstetrics                             Anesthesia Physical Anesthesia Plan  ASA: II  Anesthesia Plan: MAC   Post-op Pain Management:    Induction: Intravenous  PONV Risk Score and Plan: 2 and Treatment may vary due to age or medical condition and Propofol infusion  Airway Management Planned: Nasal Cannula  Additional Equipment: None  Intra-op Plan:   Post-operative Plan:   Informed Consent: I have reviewed the patients History and Physical, chart, labs and discussed the procedure including the risks, benefits and alternatives for the proposed anesthesia with the patient or authorized representative who has indicated his/her understanding and acceptance.     Dental advisory given  Plan Discussed with: CRNA and Surgeon  Anesthesia Plan Comments:         Anesthesia Quick Evaluation  Anesthesia Physical Anesthesia Plan  ASA: III  Anesthesia Plan: MAC   Post-op Pain Management:    Induction: Intravenous  PONV Risk Score and Plan: 2 and TIVA, Propofol infusion and Treatment may vary due to age or medical condition  Airway Management  Planned: Natural Airway  Additional Equipment: None  Intra-op Plan:   Post-operative Plan:   Informed Consent: I have reviewed the patients History and Physical, chart, labs and discussed the procedure including the risks, benefits and alternatives for the proposed anesthesia with the patient or authorized representative who has indicated his/her understanding and acceptance.     Dental advisory given  Plan Discussed with: CRNA  Anesthesia Plan Comments: (PAT note written 06/26/2020 by Myra Gianotti, PA-C. )      Anesthesia Quick Evaluation

## 2020-06-28 ENCOUNTER — Ambulatory Visit (HOSPITAL_COMMUNITY)
Admission: RE | Admit: 2020-06-28 | Discharge: 2020-06-28 | Disposition: A | Discharge status: Home / Self Care | Payer: 59 | Attending: Ophthalmology | Admitting: Ophthalmology

## 2020-06-28 ENCOUNTER — Other Ambulatory Visit: Payer: Self-pay

## 2020-06-28 ENCOUNTER — Encounter (HOSPITAL_COMMUNITY)
Admission: RE | Disposition: A | Discharge status: Home / Self Care | Payer: Self-pay | Source: Home / Self Care | Attending: Ophthalmology

## 2020-06-28 ENCOUNTER — Ambulatory Visit (HOSPITAL_COMMUNITY): Payer: 59 | Admitting: Vascular Surgery

## 2020-06-28 ENCOUNTER — Encounter (HOSPITAL_COMMUNITY): Payer: Self-pay | Admitting: Ophthalmology

## 2020-06-28 DIAGNOSIS — J45909 Unspecified asthma, uncomplicated: Secondary | ICD-10-CM | POA: Insufficient documentation

## 2020-06-28 DIAGNOSIS — I2510 Atherosclerotic heart disease of native coronary artery without angina pectoris: Secondary | ICD-10-CM | POA: Insufficient documentation

## 2020-06-28 DIAGNOSIS — E1136 Type 2 diabetes mellitus with diabetic cataract: Secondary | ICD-10-CM | POA: Diagnosis present

## 2020-06-28 DIAGNOSIS — Z8249 Family history of ischemic heart disease and other diseases of the circulatory system: Secondary | ICD-10-CM | POA: Insufficient documentation

## 2020-06-28 DIAGNOSIS — I10 Essential (primary) hypertension: Secondary | ICD-10-CM | POA: Diagnosis not present

## 2020-06-28 HISTORY — PX: CATARACT EXTRACTION W/PHACO: SHX586

## 2020-06-28 LAB — GLUCOSE, CAPILLARY
Glucose-Capillary: 167 mg/dL — ABNORMAL HIGH (ref 70–99)
Glucose-Capillary: 123 mg/dL — ABNORMAL HIGH (ref 70–99)

## 2020-06-28 SURGERY — PHACOEMULSIFICATION, CATARACT, WITH IOL INSERTION
Anesthesia: Monitor Anesthesia Care | Site: Eye | Laterality: Right

## 2020-06-28 MED ORDER — CHLORHEXIDINE GLUCONATE 0.12 % MT SOLN
15.0000 mL | Freq: Once | OROMUCOSAL | Status: AC
  Filled 2020-06-28: qty 15

## 2020-06-28 MED ORDER — LIDOCAINE HCL 2 % IJ SOLN
INTRAMUSCULAR | Status: AC
  Filled 2020-06-28: qty 20

## 2020-06-28 MED ORDER — HYPROMELLOSE (GONIOSCOPIC) 2.5 % OP SOLN
OPHTHALMIC | Status: AC
  Filled 2020-06-28: qty 15

## 2020-06-28 MED ORDER — SODIUM HYALURONATE 10 MG/ML IO SOLN
INTRAOCULAR | Status: AC
  Filled 2020-06-28: qty 0.85

## 2020-06-28 MED ORDER — HYALURONIDASE HUMAN 150 UNIT/ML IJ SOLN
INTRAMUSCULAR | Status: AC
  Filled 2020-06-28: qty 1

## 2020-06-28 MED ORDER — TETRACAINE HCL 0.5 % OP SOLN
OPHTHALMIC | Status: AC
  Filled 2020-06-28: qty 4

## 2020-06-28 MED ORDER — NA CHONDROIT SULF-NA HYALURON 40-30 MG/ML IO SOLN
INTRAOCULAR | Status: DC | PRN
  Administered 2020-06-28: 0.5 mL via INTRAOCULAR

## 2020-06-28 MED ORDER — ORAL CARE MOUTH RINSE
15.0000 mL | Freq: Once | OROMUCOSAL | Status: AC
  Administered 2020-06-28: 15 mL via OROMUCOSAL

## 2020-06-28 MED ORDER — OFLOXACIN 0.3 % OP SOLN
1.0000 [drp] | OPHTHALMIC | Status: AC | PRN
  Administered 2020-06-28 (×3): 1 [drp] via OPHTHALMIC
  Filled 2020-06-28: qty 5

## 2020-06-28 MED ORDER — EPINEPHRINE PF 1 MG/ML IJ SOLN
INTRAMUSCULAR | Status: AC
  Filled 2020-06-28: qty 1

## 2020-06-28 MED ORDER — ACETYLCHOLINE CHLORIDE 20 MG IO SOLR
INTRAOCULAR | Status: AC
  Filled 2020-06-28: qty 1

## 2020-06-28 MED ORDER — FENTANYL CITRATE (PF) 100 MCG/2ML IJ SOLN: 25.0000 ug | INTRAMUSCULAR | Status: DC | PRN

## 2020-06-28 MED ORDER — FENTANYL CITRATE (PF) 100 MCG/2ML IJ SOLN
INTRAMUSCULAR | Status: DC | PRN
  Administered 2020-06-28: 50 ug via INTRAVENOUS

## 2020-06-28 MED ORDER — DEXAMETHASONE SODIUM PHOSPHATE 10 MG/ML IJ SOLN
INTRAMUSCULAR | Status: DC | PRN
  Administered 2020-06-28: 10 mg

## 2020-06-28 MED ORDER — BSS PLUS IO SOLN
INTRAOCULAR | Status: AC
  Filled 2020-06-28: qty 500

## 2020-06-28 MED ORDER — BSS IO SOLN
INTRAOCULAR | Status: DC | PRN
  Administered 2020-06-28: 15 mL via INTRAOCULAR

## 2020-06-28 MED ORDER — LIDOCAINE HCL 2 % IJ SOLN
INTRAMUSCULAR | Status: DC | PRN
  Administered 2020-06-28: 5 mL via RETROBULBAR

## 2020-06-28 MED ORDER — MIDAZOLAM HCL 5 MG/5ML IJ SOLN
INTRAMUSCULAR | Status: DC | PRN
  Administered 2020-06-28: 1 mg via INTRAVENOUS

## 2020-06-28 MED ORDER — INDOCYANINE GREEN 25 MG IV SOLR
INTRAVENOUS | Status: AC
  Filled 2020-06-28: qty 10

## 2020-06-28 MED ORDER — EPINEPHRINE PF 1 MG/ML IJ SOLN: INTRAOCULAR | Status: DC | PRN

## 2020-06-28 MED ORDER — TROPICAMIDE 1 % OP SOLN
1.0000 [drp] | OPHTHALMIC | Status: AC | PRN
  Administered 2020-06-28 (×3): 1 [drp] via OPHTHALMIC
  Filled 2020-06-28: qty 15

## 2020-06-28 MED ORDER — CEFAZOLIN SUBCONJUNCTIVAL INJECTION 100 MG/0.5 ML
INJECTION | SUBCONJUNCTIVAL | Status: DC | PRN
  Administered 2020-06-28: 100 mg via SUBCONJUNCTIVAL

## 2020-06-28 MED ORDER — TOBRAMYCIN-DEXAMETHASONE 0.3-0.1 % OP OINT
TOPICAL_OINTMENT | OPHTHALMIC | Status: DC | PRN
  Administered 2020-06-28: 1 via OPHTHALMIC

## 2020-06-28 MED ORDER — CYCLOPENTOLATE HCL 1 % OP SOLN
1.0000 [drp] | OPHTHALMIC | Status: AC | PRN
  Administered 2020-06-28 (×3): 1 [drp] via OPHTHALMIC
  Filled 2020-06-28: qty 2

## 2020-06-28 MED ORDER — TETRACAINE 0.5 % OP SOLN OPTIME - NO CHARGE
OPHTHALMIC | Status: DC | PRN
  Administered 2020-06-28: 1 [drp] via OPHTHALMIC

## 2020-06-28 MED ORDER — LACTATED RINGERS IV SOLN: INTRAVENOUS | Status: DC | PRN

## 2020-06-28 MED ORDER — MEPERIDINE HCL 25 MG/ML IJ SOLN: 6.2500 mg | INTRAMUSCULAR | Status: DC | PRN

## 2020-06-28 MED ORDER — NA CHONDROIT SULF-NA HYALURON 40-30 MG/ML IO SOLN
INTRAOCULAR | Status: AC
  Filled 2020-06-28: qty 1

## 2020-06-28 MED ORDER — DEXAMETHASONE SODIUM PHOSPHATE 10 MG/ML IJ SOLN
INTRAMUSCULAR | Status: AC
  Filled 2020-06-28: qty 1

## 2020-06-28 MED ORDER — LIDOCAINE 2% (20 MG/ML) 5 ML SYRINGE
INTRAMUSCULAR | Status: DC | PRN
Start: 2020-06-28 — End: 2020-06-28
  Administered 2020-06-28: 60 mg via INTRAVENOUS

## 2020-06-28 MED ORDER — DEXAMETHASONE SODIUM PHOSPHATE 4 MG/ML IJ SOLN
INTRAMUSCULAR | Status: DC | PRN
  Administered 2020-06-28: 4 mg via INTRAVENOUS

## 2020-06-28 MED ORDER — ACETYLCHOLINE CHLORIDE 20 MG IO SOLR
INTRAOCULAR | Status: DC | PRN
  Administered 2020-06-28: 20 mg via INTRAOCULAR

## 2020-06-28 MED ORDER — PROVISC 10 MG/ML IO SOLN
INTRAOCULAR | Status: DC | PRN
  Administered 2020-06-28: .85 mL via INTRAOCULAR

## 2020-06-28 MED ORDER — BUPIVACAINE HCL (PF) 0.75 % IJ SOLN
INTRAMUSCULAR | Status: AC
  Filled 2020-06-28: qty 10

## 2020-06-28 MED ORDER — PROPOFOL 10 MG/ML IV BOLUS
INTRAVENOUS | Status: AC
  Filled 2020-06-28: qty 20

## 2020-06-28 MED ORDER — ATROPINE SULFATE 1 % OP SOLN
OPHTHALMIC | Status: AC
  Filled 2020-06-28: qty 5

## 2020-06-28 MED ORDER — ONDANSETRON HCL 4 MG/2ML IJ SOLN
INTRAMUSCULAR | Status: DC | PRN
  Administered 2020-06-28: 4 mg via INTRAVENOUS

## 2020-06-28 MED ORDER — PHENYLEPHRINE HCL 2.5 % OP SOLN
1.0000 [drp] | OPHTHALMIC | Status: AC | PRN
  Administered 2020-06-28 (×3): 1 [drp] via OPHTHALMIC
  Filled 2020-06-28: qty 2

## 2020-06-28 MED ORDER — CEFAZOLIN SUBCONJUNCTIVAL INJECTION 100 MG/0.5 ML
100.0000 mg | INJECTION | SUBCONJUNCTIVAL | Status: DC
  Filled 2020-06-28: qty 5

## 2020-06-28 MED ORDER — HYPROMELLOSE (GONIOSCOPIC) 2.5 % OP SOLN
OPHTHALMIC | Status: DC | PRN
Start: 2020-06-28 — End: 2020-06-28
  Administered 2020-06-28: 1 [drp]

## 2020-06-28 MED ORDER — PROPOFOL 10 MG/ML IV BOLUS
INTRAVENOUS | Status: DC | PRN
  Administered 2020-06-28: 30 mg via INTRAVENOUS

## 2020-06-28 MED ORDER — FENTANYL CITRATE (PF) 250 MCG/5ML IJ SOLN
INTRAMUSCULAR | Status: AC
  Filled 2020-06-28: qty 5

## 2020-06-28 MED ORDER — TOBRAMYCIN-DEXAMETHASONE 0.3-0.1 % OP OINT
TOPICAL_OINTMENT | OPHTHALMIC | Status: AC
  Filled 2020-06-28: qty 3.5

## 2020-06-28 MED ORDER — BSS IO SOLN
INTRAOCULAR | Status: AC
  Filled 2020-06-28: qty 15

## 2020-06-28 MED ORDER — ONDANSETRON HCL 4 MG/2ML IJ SOLN
INTRAMUSCULAR | Status: AC
  Filled 2020-06-28: qty 2

## 2020-06-28 MED ORDER — PROMETHAZINE HCL 25 MG/ML IJ SOLN: 6.2500 mg | INTRAMUSCULAR | Status: DC | PRN

## 2020-06-28 MED ORDER — LIDOCAINE 2% (20 MG/ML) 5 ML SYRINGE
INTRAMUSCULAR | Status: AC
  Filled 2020-06-28: qty 5

## 2020-06-28 MED ORDER — MIDAZOLAM HCL 2 MG/2ML IJ SOLN
INTRAMUSCULAR | Status: AC
  Filled 2020-06-28: qty 2

## 2020-06-28 SURGICAL SUPPLY — 63 items
BAND WRIST GAS GREEN (MISCELLANEOUS) IMPLANT
BLADE EYE SLIT 3.0 45D BEAV (BLADE) ×2 IMPLANT
BLADE KERATOME 2.75 (BLADE) ×2 IMPLANT
BLADE MVR KNIFE 20G (BLADE) IMPLANT
BLADE STAB KNIFE 15DEG (BLADE) ×2 IMPLANT
CANNULA ANT CHAM MAIN (OPHTHALMIC RELATED) IMPLANT
CANNULA ANTERIOR CHAMBER 27GA (MISCELLANEOUS) ×1 IMPLANT
CANNULA DUAL BORE 23G (CANNULA) IMPLANT
CANNULA DUALBORE 25G (CANNULA) IMPLANT
CANNULA VLV SOFT TIP 25G (OPHTHALMIC) ×1 IMPLANT
CANNULA VLV SOFT TIP 25GA (OPHTHALMIC) ×2 IMPLANT
CARTRIDGE A MONARCH (MISCELLANEOUS) IMPLANT
CARTRIDGE C MONARCH III (MISCELLANEOUS) ×1 IMPLANT
CAUTERY EYE LOW TEMP 1300F FIN (OPHTHALMIC RELATED) IMPLANT
CLSR STERI-STRIP ANTIMIC 1/2X4 (GAUZE/BANDAGES/DRESSINGS) ×2 IMPLANT
COVER MAYO STAND STRL (DRAPES) ×1 IMPLANT
DRAPE RETRACTOR (MISCELLANEOUS) ×2 IMPLANT
DRAPE SHEET LG 3/4 BI-LAMINATE (DRAPES) ×2 IMPLANT
GAS WRIST BAND GREEN (MISCELLANEOUS)
GLOVE SURG SYN 7.5  E (GLOVE) ×2
GLOVE SURG SYN 7.5 E (GLOVE) ×1 IMPLANT
GLOVE SURG SYN 7.5 PF PI (GLOVE) ×1 IMPLANT
GOWN STRL REUS W/ TWL LRG LVL3 (GOWN DISPOSABLE) ×1 IMPLANT
GOWN STRL REUS W/TWL LRG LVL3 (GOWN DISPOSABLE) ×2
KIT BASIN OR (CUSTOM PROCEDURE TRAY) ×2 IMPLANT
KIT TURNOVER KIT B (KITS) ×2 IMPLANT
KNIFE CRESCENT 1.75 EDGEAHEAD (BLADE) ×2 IMPLANT
LENS IOL ACRSF IQ PC 22.5 (Intraocular Lens) IMPLANT
LENS IOL ACRYSOF IQ POST 22.5 (Intraocular Lens) ×2 IMPLANT
NDL 18GX1X1/2 (RX/OR ONLY) (NEEDLE) ×1 IMPLANT
NDL 25GX 5/8IN NON SAFETY (NEEDLE) ×1 IMPLANT
NDL FILTER BLUNT 18X1 1/2 (NEEDLE) ×1 IMPLANT
NDL HYPO 25GX1X1/2 BEV (NEEDLE) IMPLANT
NDL HYPO 30X.5 LL (NEEDLE) ×2 IMPLANT
NDL RETROBULBAR 25GX1.5 (NEEDLE) ×1 IMPLANT
NEEDLE 18GX1X1/2 (RX/OR ONLY) (NEEDLE) ×2 IMPLANT
NEEDLE 25GX 5/8IN NON SAFETY (NEEDLE) ×2 IMPLANT
NEEDLE FILTER BLUNT 18X 1/2SAF (NEEDLE) ×1
NEEDLE FILTER BLUNT 18X1 1/2 (NEEDLE) ×1 IMPLANT
NEEDLE HYPO 25GX1X1/2 BEV (NEEDLE) IMPLANT
NEEDLE HYPO 30X.5 LL (NEEDLE) ×4 IMPLANT
NEEDLE RETROBULBAR 25GX1.5 (NEEDLE) ×2 IMPLANT
NS IRRIG 1000ML POUR BTL (IV SOLUTION) ×2 IMPLANT
PACK CATARACT/VITRECTOMY 25GA (OPHTHALMIC) IMPLANT
PACK FRAGMATOME (OPHTHALMIC) IMPLANT
PACK VITRECTOMY CUSTOM (CUSTOM PROCEDURE TRAY) ×2 IMPLANT
PAD ARMBOARD 7.5X6 YLW CONV (MISCELLANEOUS) ×4 IMPLANT
PAK PIK CVS CATARACT (OPHTHALMIC) IMPLANT
RING MALYGIN (MISCELLANEOUS) ×2 IMPLANT
ROLLS DENTAL (MISCELLANEOUS) IMPLANT
SOL ANTI FOG 6CC (MISCELLANEOUS) IMPLANT
SOLUTION ANTI FOG 6CC (MISCELLANEOUS)
SUT ETHILON 10 0 BV100 4 (SUTURE) ×1 IMPLANT
SUT ETHILON 10 0 CS140 6 (SUTURE) ×2 IMPLANT
SUT VICRYL 7 0 TG140 8 (SUTURE) IMPLANT
SUT VICRYL 8 0 TG140 8 (SUTURE) IMPLANT
SYR 10ML LL (SYRINGE) IMPLANT
SYR 20ML LL LF (SYRINGE) ×2 IMPLANT
SYR 5ML LL (SYRINGE) ×2 IMPLANT
SYR TB 1ML LUER SLIP (SYRINGE) ×2 IMPLANT
TIP ABS 45DEG FLARED 0.9MM (TIP) ×2 IMPLANT
TOWEL OR NON WOVEN STRL DISP B (DISPOSABLE) ×1 IMPLANT
WATER STERILE IRR 1000ML POUR (IV SOLUTION) ×2 IMPLANT

## 2020-06-28 NOTE — Transfer of Care (Signed)
Immediate Anesthesia Transfer of Care Note  Patient: Rebecca Roth  Procedure(s) Performed: CATARACT EXTRACTION PHACO AND INTRAOCULAR LENS PLACEMENT (IOC) (Right Eye)  Patient Location: PACU  Anesthesia Type:MAC and Regional  Level of Consciousness: awake, oriented and patient cooperative  Airway & Oxygen Therapy: Patient Spontanous Breathing and Patient connected to nasal cannula oxygen  Post-op Assessment: Report given to RN and Post -op Vital signs reviewed and stable  Post vital signs: Reviewed  Last Vitals:  Vitals Value Taken Time  BP 105/68 06/28/20 0903  Temp    Pulse 83 06/28/20 0906  Resp 11 06/28/20 0906  SpO2 94 % 06/28/20 0906  Vitals shown include unvalidated device data.  Last Pain:  Vitals:   06/28/20 0600  TempSrc: Oral      Patients Stated Pain Goal: 3 (10/26/12 1438)  Complications: No complications documented.

## 2020-06-28 NOTE — Anesthesia Procedure Notes (Signed)
Procedure Name: MAC Date/Time: 06/28/2020 7:45 AM Performed by: Jenne Campus, CRNA Pre-anesthesia Checklist: Patient identified, Emergency Drugs available, Suction available and Patient being monitored Oxygen Delivery Method: Nasal cannula

## 2020-06-28 NOTE — Brief Op Note (Signed)
06/28/2020  8:54 AM  PATIENT:  Rebecca Roth  59 y.o. female  PRE-OPERATIVE DIAGNOSIS:  RIGHT CATARACT SECONDARY TO OCULAR DISORDERS COMPLICATED CATARACT  POST-OPERATIVE DIAGNOSIS:  RIGHT CATARACT SECONDARY TO OCULAR DISORDERS  PROCEDURE:  Procedure(s): CATARACT EXTRACTION PHACO AND INTRAOCULAR LENS PLACEMENT (IOC) (Right)  SURGEON:  Surgeon(s) and Role:    * Jalene Mullet, MD - Primary  PHYSICIAN ASSISTANT:   ASSISTANTS: none   ANESTHESIA:   local and MAC  EBL:  3 mL   BLOOD ADMINISTERED:none  DRAINS: none   LOCAL MEDICATIONS USED:  MARCAINE    and LIDOCAINE   SPECIMEN:  No Specimen  DISPOSITION OF SPECIMEN:  N/A  COUNTS:  YES  TOURNIQUET:  * No tourniquets in log *  DICTATION: .Note written in EPIC  PLAN OF CARE: Discharge to home after PACU  PATIENT DISPOSITION:  PACU - hemodynamically stable.   Delay start of Pharmacological VTE agent (>24hrs) due to surgical blood loss or risk of bleeding: not applicable

## 2020-06-28 NOTE — H&P (Signed)
Date of examination:  06/28/2020  Indication for surgery: Cataract right eye  Pertinent past medical history:  Past Medical History:  Diagnosis Date  . Anemia   . Anxiety   . Asthma   . Carotid artery disease ([facility])    Carotid US 07/06/16 [facility]): 50-69% RICA stenosis, < 50% LICA stenosis   . Cataract    right eye  . Coronary artery disease    50% mid LCX, otherwise normal coronaries, medical therapy 12/04/15 ([place])  . Depression   . Diabetes mellitus without complication ([facility])   . Frequent headaches   . GERD (gastroesophageal reflux disease)   . Hypertension   . Hypothyroidism   . IBS (irritable bowel syndrome)   . Swelling   . Thyroid disease   . Wears glasses     Pertinent ocular history:  Severe cataract right eye  Pertinent family history:  Family History  Problem Relation Age of Onset  . Stroke Mother   . Heart disease Mother   . Pancreatic cancer Father   . Kidney cancer Brother   . Emphysema Maternal Grandfather     General:  Healthy appearing patient in no distress.    Eyes:    Acuity OD 20/400   External: Within normal limits     Anterior segment: Within normal limits       Fundus: Hazy view due to cataract    Impression:  Cataract right eye  Plan: Cataract extraction with IOL implant - may require use of iris support  Carmela Rima, MD

## 2020-06-28 NOTE — Discharge Instructions (Addendum)
TAKE EYE DROPS AS INSTRUCTED

## 2020-06-28 NOTE — Anesthesia Postprocedure Evaluation (Signed)
Anesthesia Post Note  Patient: Rebecca Roth  Procedure(s) Performed: CATARACT EXTRACTION PHACO AND INTRAOCULAR LENS PLACEMENT (IOC) (Right Eye)     Patient location during evaluation: PACU Anesthesia Type: MAC Level of consciousness: awake and alert Pain management: pain level controlled Vital Signs Assessment: post-procedure vital signs reviewed and stable Respiratory status: spontaneous breathing Cardiovascular status: stable Anesthetic complications: no   No complications documented.  Last Vitals:  Vitals:   06/28/20 0930 06/28/20 0935  BP:    Pulse: 78   Resp: 15   Temp:  (!) 36.2 C  SpO2: 93%     Last Pain:  Vitals:   06/28/20 0935  TempSrc:   PainSc: 0-No pain                 Nolon Nations

## 2020-07-02 ENCOUNTER — Encounter (HOSPITAL_COMMUNITY): Payer: Self-pay | Admitting: Ophthalmology

## 2020-07-04 NOTE — Op Note (Signed)
Rebecca Roth 07/04/2020 Diagnosis: Cataract right eye  Procedure: Cataract extraction with intraocular lens implant right eye Operative Eye:  right eye  Surgeon: Harrold Donath Estimated Blood Loss: minimal Specimens for Pathology:  None Complications: none   The patient was prepped and draped in the usual fashion for ocular surgery on the right eye. A lid speculum was placed.   Paracentis at 2 o'clock was made. The anterior chamber was subsequently filled with Viscoat. A superior near limbal corneal incision was made with the 2.4 mm keratome blade. Trypan blue was used to highlight the anterior capsule and irrigated free with BSS. The anterior chamber was subsequently filled with Viscoat. Provisc was instilled over the anterior capsule. A continuous, curvilinear capsulorrhexis was performed using Utrata forceps and a curved cystotome. Hydrodissection was performed with the [facility] cannula, and the nucleus was noted to rotate freely. Phacoemulsification was then performed and lens divided into quadrants. All nuclear fragments were removed without complication. Residual cortical material was removed using irrigation and aspiration. The capsular bag and anterior chamber were subsequently expanded with viscoelastic. The intraocular lens was injected into the capsular bag and positioned with the aid of a Sinskey hook.  Residual viscoelastic was removed using the irrigation/aspiration system, and the lens was centered. Wounds were hydrated and found to be watertight. At the close of surgery, the intraocular lens was centered and in the capsular bag. The anterior chamber was of normal depth. Intraocular pressure was normal, and all wounds were watertight.Subconjunctival injections of Ancef and Decadron were placed.  The speculum and drapes were removed and the eye was patched with Polymixin/Bacitracin ophthalmic ointment. An eye shield was placed and the patient was transferred  alert and conversant with stable vital signs to the post operative recovery area. The patient tolerated the procedure well and no complications were noted.   Harrold Donath MD

## 2020-07-12 ENCOUNTER — Ambulatory Visit (INDEPENDENT_AMBULATORY_CARE_PROVIDER_SITE_OTHER): Payer: 59 | Admitting: Mental Health

## 2020-07-12 ENCOUNTER — Ambulatory Visit (INDEPENDENT_AMBULATORY_CARE_PROVIDER_SITE_OTHER): Payer: 59 | Admitting: Physician Assistant

## 2020-07-12 ENCOUNTER — Encounter: Payer: Self-pay | Admitting: Physician Assistant

## 2020-07-12 ENCOUNTER — Other Ambulatory Visit: Payer: Self-pay

## 2020-07-12 DIAGNOSIS — F411 Generalized anxiety disorder: Secondary | ICD-10-CM

## 2020-07-12 DIAGNOSIS — F422 Mixed obsessional thoughts and acts: Secondary | ICD-10-CM | POA: Diagnosis not present

## 2020-07-12 DIAGNOSIS — F3342 Major depressive disorder, recurrent, in full remission: Secondary | ICD-10-CM

## 2020-07-12 MED ORDER — FLUOXETINE HCL 40 MG PO CAPS: 80.0000 mg | ORAL_CAPSULE | Freq: Every day | ORAL | 5 refills | Status: DC

## 2020-07-12 MED ORDER — LAMOTRIGINE 150 MG PO TABS: 150.0000 mg | ORAL_TABLET | Freq: Every day | ORAL | 5 refills | Status: DC

## 2020-07-12 MED ORDER — BUSPIRONE HCL 30 MG PO TABS: 30.0000 mg | ORAL_TABLET | Freq: Two times a day (BID) | ORAL | 5 refills | Status: DC

## 2020-07-12 MED ORDER — LAMOTRIGINE 100 MG PO TABS: 100.0000 mg | ORAL_TABLET | Freq: Every day | ORAL | 5 refills | Status: DC

## 2020-07-12 MED ORDER — BREXPIPRAZOLE 2 MG PO TABS: 2.0000 mg | ORAL_TABLET | Freq: Every day | ORAL | 5 refills | Status: DC

## 2020-07-12 MED ORDER — QUETIAPINE FUMARATE 25 MG PO TABS: 12.5000 mg | ORAL_TABLET | Freq: Every day | ORAL | 1 refills | Status: DC

## 2020-07-12 NOTE — Progress Notes (Signed)
[facility] Med Check  Patient ID: Rebecca Roth,  MRN: [id number]  PCP: Jim Like, NP  Date of Evaluation: 07/12/2020 Time spent:30 minutes  Chief Complaint:  Chief Complaint    Anxiety      HISTORY/CURRENT STATUS: HPI For routine med check.  Since LOV, she had a right retinal tear. She went in for a routine eye check, but they found the tear, she was in surgery the next day. Laid on her stomach for 5 weeks.  Then had a cataract removed 2 weeks ago.  Has surgery on the left eye for retinal issues (no tears but has lattice like lesions on it) in the next few weeks. "God had His hand in all of this!"  Even though she's gone through all this, "I'm the least stressed than I've ever been." Rarely takes the Klonopin. Patient denies loss of interest in usual activities and is able to enjoy things.  Denies suicidal or homicidal thoughts.  OCD is worse right now.  She is counting in multiples a lot, even when she wakes up from sleep.  Also will be 'writing or acting like a music conductor in the air' when she wakes up.   Not sleeping well.  Able to go to sleep but sometimes wakes up after 6 hours and has trouble going back to sleep.  But she can sleep late if she wants to so it is not that much of a problem.   There's a discrepancy between pharmacy records and my notes re: Lamictal.  She clarifies dose and is doing well on it.  Denies dizziness, syncope, seizures, numbness, tingling, tremor, tics, unsteady gait, slurred speech, confusion. Denies muscle or joint pain, stiffness, or dystonia.  Individual Medical History/ Review of Systems: Changes? :No    Past medications for mental health diagnoses include: Cymbalta, Depakote, Effexor, Celexa Wellbutrin, Abilify, Zoloft ? Lexapro?, Prozac, Buspar, Klonopin, Rexulti.  Allergies: Ace inhibitors and Sulfa antibiotics  Current Medications:  Current Outpatient Medications:  .  albuterol (VENTOLIN HFA) 108 (90 Base) MCG/ACT  inhaler, Inhale 2 puffs into the lungs every 6 (six) hours as needed for wheezing or shortness of breath., Disp: , Rfl:  .  aspirin EC 81 MG tablet, Take 81 mg by mouth daily. Swallow whole., Disp: , Rfl:  .  atorvastatin (LIPITOR) 40 MG tablet, Take 1 tablet (40 mg total) by mouth daily., Disp: 30 tablet, Rfl: 2 .  BAYER CONTOUR TEST test strip, , Disp: , Rfl:  .  BREO ELLIPTA 200-25 MCG/INH AEPB, Inhale 1 puff into the lungs daily as needed (For shortness of breath). , Disp: , Rfl: 0 .  brexpiprazole (REXULTI) 2 MG TABS tablet, Take 1 tablet (2 mg total) by mouth daily., Disp: 30 tablet, Rfl: 5 .  busPIRone (BUSPAR) 30 MG tablet, Take 1 tablet (30 mg total) by mouth 2 (two) times daily., Disp: 60 tablet, Rfl: 5 .  clonazePAM (KLONOPIN) 0.5 MG tablet, Take 1 tablet (0.5 mg total) by mouth 2 (two) times daily as needed for anxiety., Disp: 30 tablet, Rfl: 3 .  etodolac (LODINE) 400 MG tablet, TAKE 1 TABLET BY MOUTH TWICE A DAY WITH FOOD (Patient taking differently: Take 200 mg by mouth 2 (two) times daily. ), Disp: 60 tablet, Rfl: 2 .  fenofibrate 54 MG tablet, TAKE 1 TABLET BY MOUTH EVERY DAY (Patient taking differently: Take 54 mg by mouth daily. ), Disp: 30 tablet, Rfl: 2 .  FLUoxetine (PROZAC) 40 MG capsule, Take 2 capsules (80 mg total)  by mouth daily., Disp: 60 capsule, Rfl: 5 .  furosemide (LASIX) 40 MG tablet, Take 40 mg by mouth daily., Disp: , Rfl:  .  Insulin Pen Needle (SURE COMFORT PEN NEEDLES) 31G X 5 MM MISC, Use as directed, Disp: 30 each, Rfl: 1 .  Lancet Devices (SIMPLE DIAGNOSTICS LANCING DEV) MISC, , Disp: , Rfl:  .  LEVEMIR FLEXTOUCH 100 UNIT/ML Pen, Inject 80 Units into the skin 2 (two) times daily. , Disp: , Rfl:  .  levocetirizine (XYZAL) 5 MG tablet, Take 5 mg by mouth daily., Disp: , Rfl:  .  levothyroxine (SYNTHROID) 137 MCG tablet, Take 137 mcg by mouth daily., Disp: , Rfl:  .  montelukast (SINGULAIR) 10 MG tablet, TAKE 1 TABLET BY MOUTH EVERY DAY (Patient taking  differently: Take 10 mg by mouth daily. ), Disp: 90 tablet, Rfl: 1 .  pantoprazole (PROTONIX) 40 MG tablet, TAKE 1 TABLET BY MOUTH TWICE A DAY (Patient taking differently: Take 40 mg by mouth 2 (two) times daily. ), Disp: 60 tablet, Rfl: 2 .  propranolol ER (INDERAL LA) 60 MG 24 hr capsule, TAKE 1 CAPSULE BY MOUTH EVERY DAY (Patient taking differently: Take 60 mg by mouth daily. ), Disp: 30 capsule, Rfl: 1 .  SYNJARDY XR 25-1000 MG TB24, TAKE 1 TABLET BY MOUTH EVERY DAY IN THE MORNING (Patient taking differently: Take 1 tablet by mouth daily. ), Disp: 30 tablet, Rfl: 2 .  TRULICITY 0.75 MG/0.5ML SOPN, Take 0.75 mg by mouth once a week., Disp: , Rfl:  .  valsartan (DIOVAN) 80 MG tablet, Take 1 tablet (80 mg total) by mouth daily., Disp: 90 tablet, Rfl: 0 .  Vitamin D, Ergocalciferol, (DRISDOL) 50000 UNITS CAPS capsule, Take 50,000 Units by mouth every Tuesday. At bedtime, Disp: , Rfl:  .  zinc gluconate 50 MG tablet, Take 50 mg by mouth daily., Disp: , Rfl:  .  lamoTRIgine (LAMICTAL) 100 MG tablet, Take 1 tablet (100 mg total) by mouth daily., Disp: 30 tablet, Rfl: 5 .  lamoTRIgine (LAMICTAL) 150 MG tablet, Take 1 tablet (150 mg total) by mouth daily., Disp: 30 tablet, Rfl: 5 .  omeprazole (PRILOSEC) 40 MG capsule, TAKE ONE CAPSULE BY MOUTH 2 TIMES A DAY (Patient not taking: Reported on 06/21/2020), Disp: 60 capsule, Rfl: 2 .  QUEtiapine (SEROQUEL) 25 MG tablet, Take 0.5-1 tablets (12.5-25 mg total) by mouth at bedtime., Disp: 30 tablet, Rfl: 1 Medication Side Effects: none  Family Medical/ Social History: Changes? No  MENTAL HEALTH EXAM:  There were no vitals taken for this visit.There is no height or weight on file to calculate BMI.  General Appearance: Casual, Neat, Well Groomed and Obese  Eye Contact:  Good  Speech:  Clear and Coherent  Volume:  Normal  Mood:  Euthymic  Affect:  Appropriate  Thought Process:  Goal Directed and Descriptions of Associations: Intact  Orientation:  Full  (Time, Place, and Person)  Thought Content: Logical   Suicidal Thoughts:  No  Homicidal Thoughts:  No  Memory:  WNL  Judgement:  Good  Insight:  Good  Psychomotor Activity:  Normal  Concentration:  Concentration: Good  Recall:  Good  Fund of Knowledge: Good  Language: Good  Assets:  Desire for Improvement  ADL's:  Intact  Cognition: WNL  Prognosis:  Good    DIAGNOSES:    ICD-10-CM   1. Generalized anxiety disorder  F41.1   2. Recurrent major depressive disorder, in full remission (HCC)  F33.42   3. Mixed  obsessional thoughts and acts  F42.2     Receiving Psychotherapy: Yes  Elio Forget, [facility]   RECOMMENDATIONS:  PDMP reviewed. I provided 20 minutes of face to face time during this encounter. I'm glad to hear she's doing well after the surgeries.  Disc options for sleep.  We've considered Trazodone or Mirtazepine the in past, but I think Seroquel would be a good choice now, b/c it will likely help her get into a deeper sleep and not wake up, until it's time to.  Discussed the OCD.  Luvox or Zoloft may be more effective than Prozac for that, but she doesn't want to change from Prozac. "It's been the best combo of meds" she's ever been on. "I'll just keep multiplying." Continue Rexulti 2 mg daily. Continue BuSpar 30 mg twice daily. Continue Klonopin 0.5 mg twice daily as needed. Continue Prozac 40 mg, 2 p.o. daily. Cont Lamictal 150 mg q am, 100 mg hs. Start Seroquel 25 mg, 1/2-1 po qhs prn sleep.  Benefits, risks side effects disc and she accepts. Continue psychotherapy with Elio Forget, [facility]. Return in 2 months.  Melony Overly, PA-C

## 2020-07-15 NOTE — Progress Notes (Signed)
      [facility] Counselor/Therapist Progress Note   Patient ID: Rebecca Roth, MRN: [id number]  Date: 07/12/20  Timespent: 45 minutes  Treatment Type: Individual  Mental Status Exam:   Appearance:   Casual   Behavior:  Appropriate  Motor:  Normal  Speech/Language:   Normal Rate  Affect:  Full Range  Mood:  Depressed, tearful  Thought process:  Coherent and Relevant  Thought content:   Logical  Perceptual disturbances:   Normal  Orientation:  Full (Time, Place, and Person)  Attention:  Good  Concentration:  good  Memory:  Immediate  Fund of knowledge:   Good  Insight:   Good  Judgment:   Good  Impulse Control:  good    Reported Symptoms: Depressed mood, anxiety, hopelessness and helplessness, isolated behavior, deficits with attention and concentration, history of panic attacks  Risk Assessment: Danger to Self: No Self-injurious Behavior: No Danger to Others: No Duty to Warn:no Physical Aggression / Violence:No  Access to Firearms a concern: No  Gang Involvement:No  Patient / guardian was educated about steps to take if suicide or homicide risk level increases between visits. While future psychiatric events cannot be accurately predicted, the patient does not currently require acute inpatient psychiatric care and does not currently meet [facility] involuntary commitment criteria.  Subjective: Patient arrived on time for today's session.  She shared how her relationships are going well, sharing details.  She stated she continues struggling with her counting behavior, and groups of 3 giving examples.  Collaboratively, made plan for her to go at least 1 hour a day of being intentional about not counting, refocusing her attention on other tasks around the house to distract her self as she stated this could be effective at times.  After 1 to 2 weeks she is to increase the hour to 2 hours and so on.  She expressed motivation to follow through  on this assignment as she wants to discontinue the behavior and take steps.  She denied any posttraumatic symptoms.  Encouraged her to utilize her support system and continue to take steps toward self-care related to her attending medical appointments as needed.  She stated she is enjoying being out of work recently, plans to return but ultimately looks forward to retirement which will be in about 1 year.  Interventions:CBT, Solution Focused and Strength-based  Diagnosis:   ICD-10-CM   1. Generalized anxiety disorder  F41.1   2. Mixed obsessional thoughts and acts  F42.2      Plan:  1.  Patient to continue to engage in individual counseling 2-4 times a month or as needed. 2.  Patient to identify and apply CBT, coping skills learned in session to decrease depression and anxiety symptoms. 3.  Patient to improve effective communication and assertiveness with others. 4.  Patient to decrease obsessive compulsive behaviors with systematic desensitization as discussed in session. 5.  Patient to contact this office, go to the local ED or call 911 if a crisis or emergency develops between visits.  Waldron Session, [facility]

## 2020-08-27 ENCOUNTER — Other Ambulatory Visit: Payer: Self-pay | Admitting: Family Medicine

## 2020-08-27 NOTE — Telephone Encounter (Signed)
May be due for appt. You last saw her in 02/2020. PCP shows different provider.???kc

## 2020-09-13 ENCOUNTER — Ambulatory Visit (INDEPENDENT_AMBULATORY_CARE_PROVIDER_SITE_OTHER): Payer: 59 | Admitting: Physician Assistant

## 2020-09-13 ENCOUNTER — Other Ambulatory Visit: Payer: Self-pay

## 2020-09-13 ENCOUNTER — Encounter: Payer: Self-pay | Admitting: Physician Assistant

## 2020-09-13 ENCOUNTER — Ambulatory Visit (INDEPENDENT_AMBULATORY_CARE_PROVIDER_SITE_OTHER): Payer: 59 | Admitting: Mental Health

## 2020-09-13 DIAGNOSIS — F411 Generalized anxiety disorder: Secondary | ICD-10-CM | POA: Diagnosis not present

## 2020-09-13 DIAGNOSIS — F422 Mixed obsessional thoughts and acts: Secondary | ICD-10-CM | POA: Diagnosis not present

## 2020-09-13 DIAGNOSIS — F3341 Major depressive disorder, recurrent, in partial remission: Secondary | ICD-10-CM | POA: Diagnosis not present

## 2020-09-13 MED ORDER — CLONAZEPAM 0.5 MG PO TABS: 0.5000 mg | ORAL_TABLET | Freq: Two times a day (BID) | ORAL | 2 refills | Status: DC | PRN

## 2020-09-13 MED ORDER — QUETIAPINE FUMARATE 25 MG PO TABS: 12.5000 mg | ORAL_TABLET | Freq: Every day | ORAL | 5 refills | Status: DC

## 2020-09-13 NOTE — Progress Notes (Signed)
[facility] Check  Patient ID: Rebecca Roth,  MRN: [id number]  PCP: Jim Like, NP  Date of Evaluation: 9/17//2021 Time spent:20 minutes  Chief Complaint:  Chief Complaint    Follow-up; Anxiety; Depression      HISTORY/CURRENT STATUS: HPI For routine med check.  OCD is much better now. Not counting things like she was. And not waking up thinking about things like she did. Feels much better. Is able to enjoy things. Energy and motivation are good.  Is working again at the [employer].  She had to be out for a while due to the retinal surgery that she had several months back.  Is enjoying being back at work.  Not isolating.  Not crying easily.  She rarely takes the Xanax but it is helpful when needed.  Since we added a very low dose of Seroquel at the last visit, she is sleeping much much better.  Denies suicidal or homicidal thoughts.  Patient denies increased energy with decreased need for sleep, no increased talkativeness, no racing thoughts, no impulsivity or risky behaviors, no increased spending, no increased libido, no grandiosity, no increased irritability or anger, and no hallucinations.  Denies dizziness, syncope, seizures, numbness, tingling, tremor, tics, unsteady gait, slurred speech, confusion. Denies muscle or joint pain, stiffness, or dystonia.  Individual Medical History/ Review of Systems: Changes? :No    Past medications for mental health diagnoses include: Cymbalta, Depakote, Effexor, Celexa Wellbutrin, Abilify, Zoloft ? Lexapro?, Prozac, Buspar, Klonopin, Rexulti.  Allergies: Ace inhibitors and Sulfa antibiotics  Current Medications:  Current Outpatient Medications:  .  albuterol (VENTOLIN HFA) 108 (90 Base) MCG/ACT inhaler, Inhale 2 puffs into the lungs every 6 (six) hours as needed for wheezing or shortness of breath., Disp: , Rfl:  .  aspirin EC 81 MG tablet, Take 81 mg by mouth daily. Swallow whole., Disp: , Rfl:  .  atorvastatin  (LIPITOR) 40 MG tablet, Take 1 tablet (40 mg total) by mouth daily., Disp: 30 tablet, Rfl: 2 .  BAYER CONTOUR TEST test strip, , Disp: , Rfl:  .  BREO ELLIPTA 200-25 MCG/INH AEPB, Inhale 1 puff into the lungs daily as needed (For shortness of breath). , Disp: , Rfl: 0 .  brexpiprazole (REXULTI) 2 MG TABS tablet, Take 1 tablet (2 mg total) by mouth daily., Disp: 30 tablet, Rfl: 5 .  busPIRone (BUSPAR) 30 MG tablet, Take 1 tablet (30 mg total) by mouth 2 (two) times daily., Disp: 60 tablet, Rfl: 5 .  clonazePAM (KLONOPIN) 0.5 MG tablet, Take 1 tablet (0.5 mg total) by mouth 2 (two) times daily as needed for anxiety., Disp: 30 tablet, Rfl: 2 .  EMGALITY 120 MG/ML SOAJ, SMARTSIG:1 SUB-Q, Disp: , Rfl:  .  etodolac (LODINE) 400 MG tablet, TAKE 1 TABLET BY MOUTH TWICE A DAY WITH FOOD (Patient taking differently: Take 200 mg by mouth 2 (two) times daily. ), Disp: 60 tablet, Rfl: 2 .  fenofibrate 54 MG tablet, TAKE 1 TABLET BY MOUTH EVERY DAY (Patient taking differently: Take 54 mg by mouth daily. ), Disp: 30 tablet, Rfl: 2 .  FLUoxetine (PROZAC) 40 MG capsule, Take 2 capsules (80 mg total) by mouth daily., Disp: 60 capsule, Rfl: 5 .  furosemide (LASIX) 40 MG tablet, Take 40 mg by mouth daily., Disp: , Rfl:  .  Insulin Pen Needle (SURE COMFORT PEN NEEDLES) 31G X 5 MM MISC, Use as directed, Disp: 30 each, Rfl: 1 .  lamoTRIgine (LAMICTAL) 100 MG tablet, Take 1 tablet (  100 mg total) by mouth daily., Disp: 30 tablet, Rfl: 5 .  lamoTRIgine (LAMICTAL) 150 MG tablet, Take 1 tablet (150 mg total) by mouth daily., Disp: 30 tablet, Rfl: 5 .  Lancet Devices (SIMPLE DIAGNOSTICS LANCING DEV) MISC, , Disp: , Rfl:  .  LEVEMIR FLEXTOUCH 100 UNIT/ML Pen, Inject 80 Units into the skin 2 (two) times daily. , Disp: , Rfl:  .  levocetirizine (XYZAL) 5 MG tablet, Take 5 mg by mouth daily., Disp: , Rfl:  .  levothyroxine (SYNTHROID) 137 MCG tablet, Take 137 mcg by mouth daily., Disp: , Rfl:  .  montelukast (SINGULAIR) 10 MG  tablet, TAKE 1 TABLET BY MOUTH EVERY DAY (Patient taking differently: Take 10 mg by mouth daily. ), Disp: 90 tablet, Rfl: 1 .  pantoprazole (PROTONIX) 40 MG tablet, TAKE 1 TABLET BY MOUTH TWICE A DAY (Patient taking differently: Take 40 mg by mouth 2 (two) times daily. ), Disp: 60 tablet, Rfl: 2 .  propranolol ER (INDERAL LA) 60 MG 24 hr capsule, TAKE 1 CAPSULE BY MOUTH EVERY DAY (Patient taking differently: Take 60 mg by mouth daily. ), Disp: 30 capsule, Rfl: 1 .  QUEtiapine (SEROQUEL) 25 MG tablet, Take 0.5-1 tablets (12.5-25 mg total) by mouth at bedtime., Disp: 30 tablet, Rfl: 5 .  SYNJARDY XR 25-1000 MG TB24, TAKE 1 TABLET BY MOUTH EVERY DAY IN THE MORNING (Patient taking differently: Take 1 tablet by mouth daily. ), Disp: 30 tablet, Rfl: 2 .  TRULICITY 0.75 MG/0.5ML SOPN, Take 0.75 mg by mouth once a week., Disp: , Rfl:  .  valsartan (DIOVAN) 80 MG tablet, Take 1 tablet (80 mg total) by mouth daily., Disp: 90 tablet, Rfl: 0 .  Vitamin D, Ergocalciferol, (DRISDOL) 50000 UNITS CAPS capsule, Take 50,000 Units by mouth every Tuesday. At bedtime, Disp: , Rfl:  .  zinc gluconate 50 MG tablet, Take 50 mg by mouth daily., Disp: , Rfl:  Medication Side Effects: none  Family Medical/ Social History: Changes? Is back at work.  MENTAL HEALTH EXAM:  There were no vitals taken for this visit.There is no height or weight on file to calculate BMI.  General Appearance: Casual, Neat, Well Groomed and Obese  Eye Contact:  Good  Speech:  Clear and Coherent  Volume:  Normal  Mood:  Euthymic  Affect:  Appropriate  Thought Process:  Goal Directed and Descriptions of Associations: Intact  Orientation:  Full (Time, Place, and Person)  Thought Content: Logical   Suicidal Thoughts:  No  Homicidal Thoughts:  No  Memory:  WNL  Judgement:  Good  Insight:  Good  Psychomotor Activity:  Normal  Concentration:  Concentration: Good  Recall:  Good  Fund of Knowledge: Good  Language: Good  Assets:  Desire for  Improvement  ADL's:  Intact  Cognition: WNL  Prognosis:  Good    DIAGNOSES:    ICD-10-CM   1. Mixed obsessional thoughts and acts  F42.2   2. Recurrent major depressive disorder, in partial remission (HCC)  F33.41   3. Generalized anxiety disorder  F41.1     Receiving Psychotherapy: Yes  Elio Forget, [facility]   RECOMMENDATIONS:  PDMP reviewed. I provided 20 minutes of face to face time during this encounter. Continue Rexulti 2 mg daily. Continue BuSpar 30 mg twice daily. Continue Klonopin 0.5 mg twice daily as needed. Continue Prozac 40 mg, 2 p.o. daily. Cont Lamictal 150 mg q am, 100 mg hs. this is used off label for depression.  She understands and  accepts. Continue Seroquel 25 mg, 1/2-1 po qhs prn sleep.  Continue psychotherapy with Elio Forget, [facility]. Return in 3 months.  Melony Overly, PA-C

## 2020-09-13 NOTE — Progress Notes (Signed)
      [facility] Counselor/Therapist Progress Note   Patient ID: Rebecca Roth, MRN: [id number]  Date: 09/13/20  Timespent: 45 minutes  Treatment Type: Individual  Mental Status Exam:   Appearance:   Casual   Behavior:  Appropriate  Motor:  Normal  Speech/Language:   Normal Rate  Affect:  Full Range  Mood:  Depressed, tearful  Thought process:  Coherent and Relevant  Thought content:   Logical  Perceptual disturbances:   Normal  Orientation:  Full (Time, Place, and Person)  Attention:  Good  Concentration:  good  Memory:  Immediate  Fund of knowledge:   Good  Insight:   Good  Judgment:   Good  Impulse Control:  good    Reported Symptoms:  anxiety,  deficits with attention and concentration, history of panic attacks  Risk Assessment: Danger to Self: No Self-injurious Behavior: No Danger to Others: No Duty to Warn:no Physical Aggression / Violence:No  Access to Firearms a concern: No  Gang Involvement:No  Patient / guardian was educated about steps to take if suicide or homicide risk level increases between visits. While future psychiatric events cannot be accurately predicted, the patient does not currently require acute inpatient psychiatric care and does not currently meet [facility] involuntary commitment criteria.  Subjective: Patient arrived on time for today's session.  She shared events and progress since last visit.  She stated that she has made progress with her tendency to count numbers obsessively.  She stated that she utilize coping skills discussed last session which has been helpful along with continuing to take her medication as prescribed to help with the anxiety.  She shared how she has been able to experience more joy and happiness recently and generally feels a sense of contentment.  Some stress and worry about her grandson who struggles with eating sufficiently.  She stated he is young child and has struggled with  eating certain foods since infancy.  She stated that her daughter has taken him to the family doctor who stated that he will "grow out of it".  She stated that they have an appointment scheduled with a therapist coming up in hopes that will be helpful.  Assisted her in identifying calming self talk to identify about the situation where she was able to state "I am worried about him but I know that he will be okay".  Discussed STOPP coping skill to utilize between visits.  Interventions:CBT, Solution Focused and Strength-based  Diagnosis: No diagnosis found.   Plan:  1.  Patient to continue to engage in individual counseling 2-4 times a month or as needed. 2.  Patient to identify and apply CBT, coping skills learned in session to decrease depression and anxiety symptoms. 3.  Patient to improve effective communication and assertiveness with others. 4.  Patient to decrease obsessive compulsive behaviors with systematic desensitization as discussed in session. 5.  Patient to contact this office, go to the local ED or call 911 if a crisis or emergency develops between visits.  Waldron Session, [facility]

## 2020-09-18 ENCOUNTER — Other Ambulatory Visit: Payer: Self-pay | Admitting: Family Medicine

## 2020-09-18 NOTE — Telephone Encounter (Signed)
Call pt to set up appointment fasting. Then may send in one month rx.

## 2020-09-18 NOTE — Telephone Encounter (Signed)
Called patient and she stated that she has transferred and is now seeing a different provider and they sent it to the wrong doctor. [place]

## 2020-09-18 NOTE — Telephone Encounter (Signed)
Your rx. Seems like she should be due for an appointment. Please double check. Thanks, kc

## 2020-10-09 ENCOUNTER — Telehealth: Payer: Self-pay | Admitting: Physician Assistant

## 2020-10-09 ENCOUNTER — Other Ambulatory Visit: Payer: Self-pay

## 2020-10-09 MED ORDER — CLONAZEPAM 0.5 MG PO TABS: 0.5000 mg | ORAL_TABLET | Freq: Two times a day (BID) | ORAL | 2 refills | Status: DC | PRN

## 2020-10-09 MED ORDER — LAMOTRIGINE 100 MG PO TABS: 100.0000 mg | ORAL_TABLET | Freq: Every day | ORAL | 5 refills | Status: DC

## 2020-10-09 MED ORDER — QUETIAPINE FUMARATE 25 MG PO TABS: 12.5000 mg | ORAL_TABLET | Freq: Every day | ORAL | 5 refills | Status: DC

## 2020-10-09 MED ORDER — BREXPIPRAZOLE 2 MG PO TABS: 2.0000 mg | ORAL_TABLET | Freq: Every day | ORAL | 5 refills | Status: DC

## 2020-10-09 MED ORDER — FLUOXETINE HCL 40 MG PO CAPS: 80.0000 mg | ORAL_CAPSULE | Freq: Every day | ORAL | 5 refills | Status: DC

## 2020-10-09 MED ORDER — BUSPIRONE HCL 30 MG PO TABS: 30.0000 mg | ORAL_TABLET | Freq: Two times a day (BID) | ORAL | 5 refills | Status: DC

## 2020-10-09 MED ORDER — LAMOTRIGINE 150 MG PO TABS: 150.0000 mg | ORAL_TABLET | Freq: Every day | ORAL | 5 refills | Status: DC

## 2020-10-09 NOTE — Telephone Encounter (Signed)
Alexa with [facility] called to verify if Rebecca Roth is an active patient since she is asking for so many refills.

## 2020-10-09 NOTE — Telephone Encounter (Signed)
Yes she is a current patient of Melony Overly

## 2020-10-25 IMAGING — DX DG CHEST 2V
2 series · 2 of 2 positions shown · non-contrast
Comparison: 05/27/2018

CLINICAL DATA: Chest pain

EXAM:
CHEST - 2 VIEW

[chest pa]
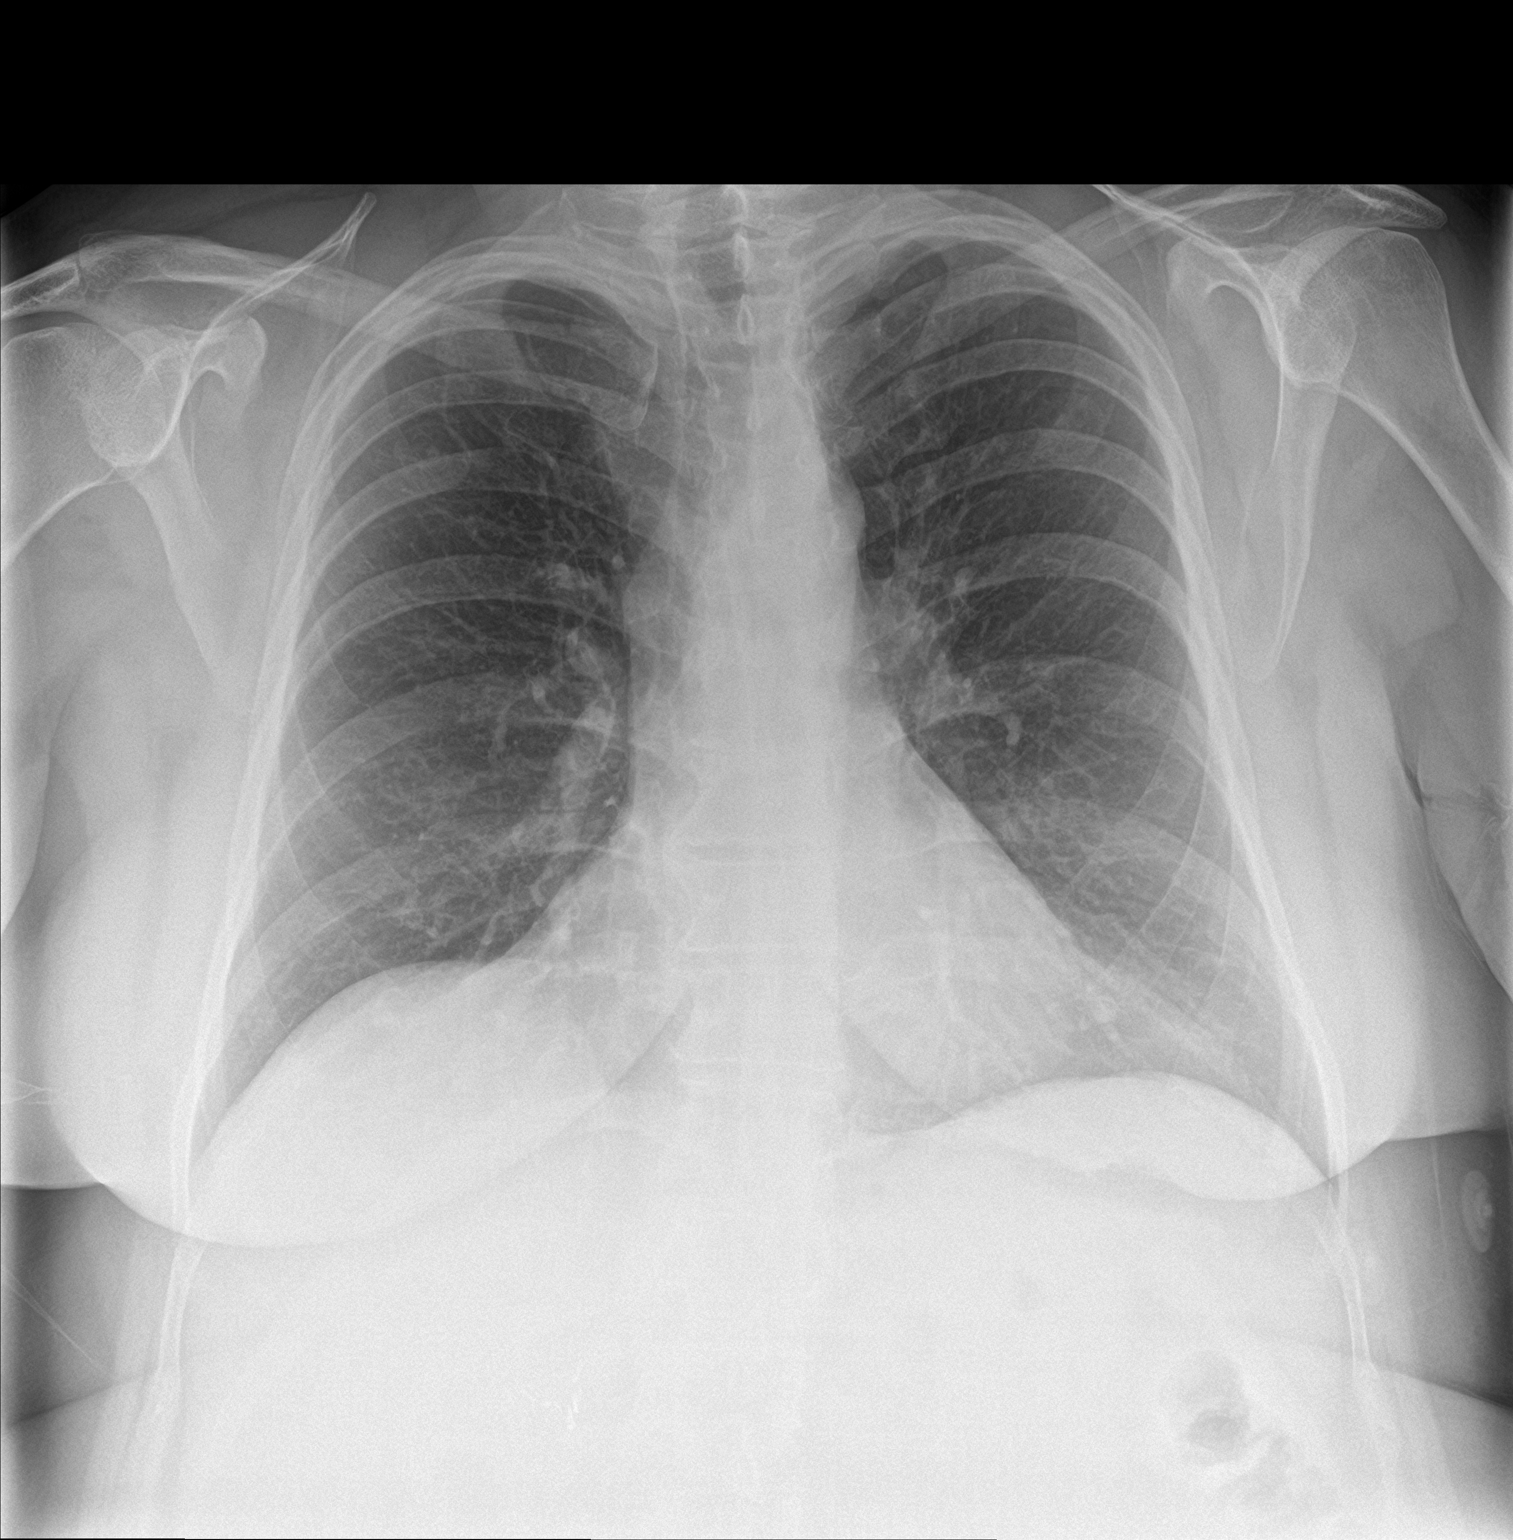

[chest lat]
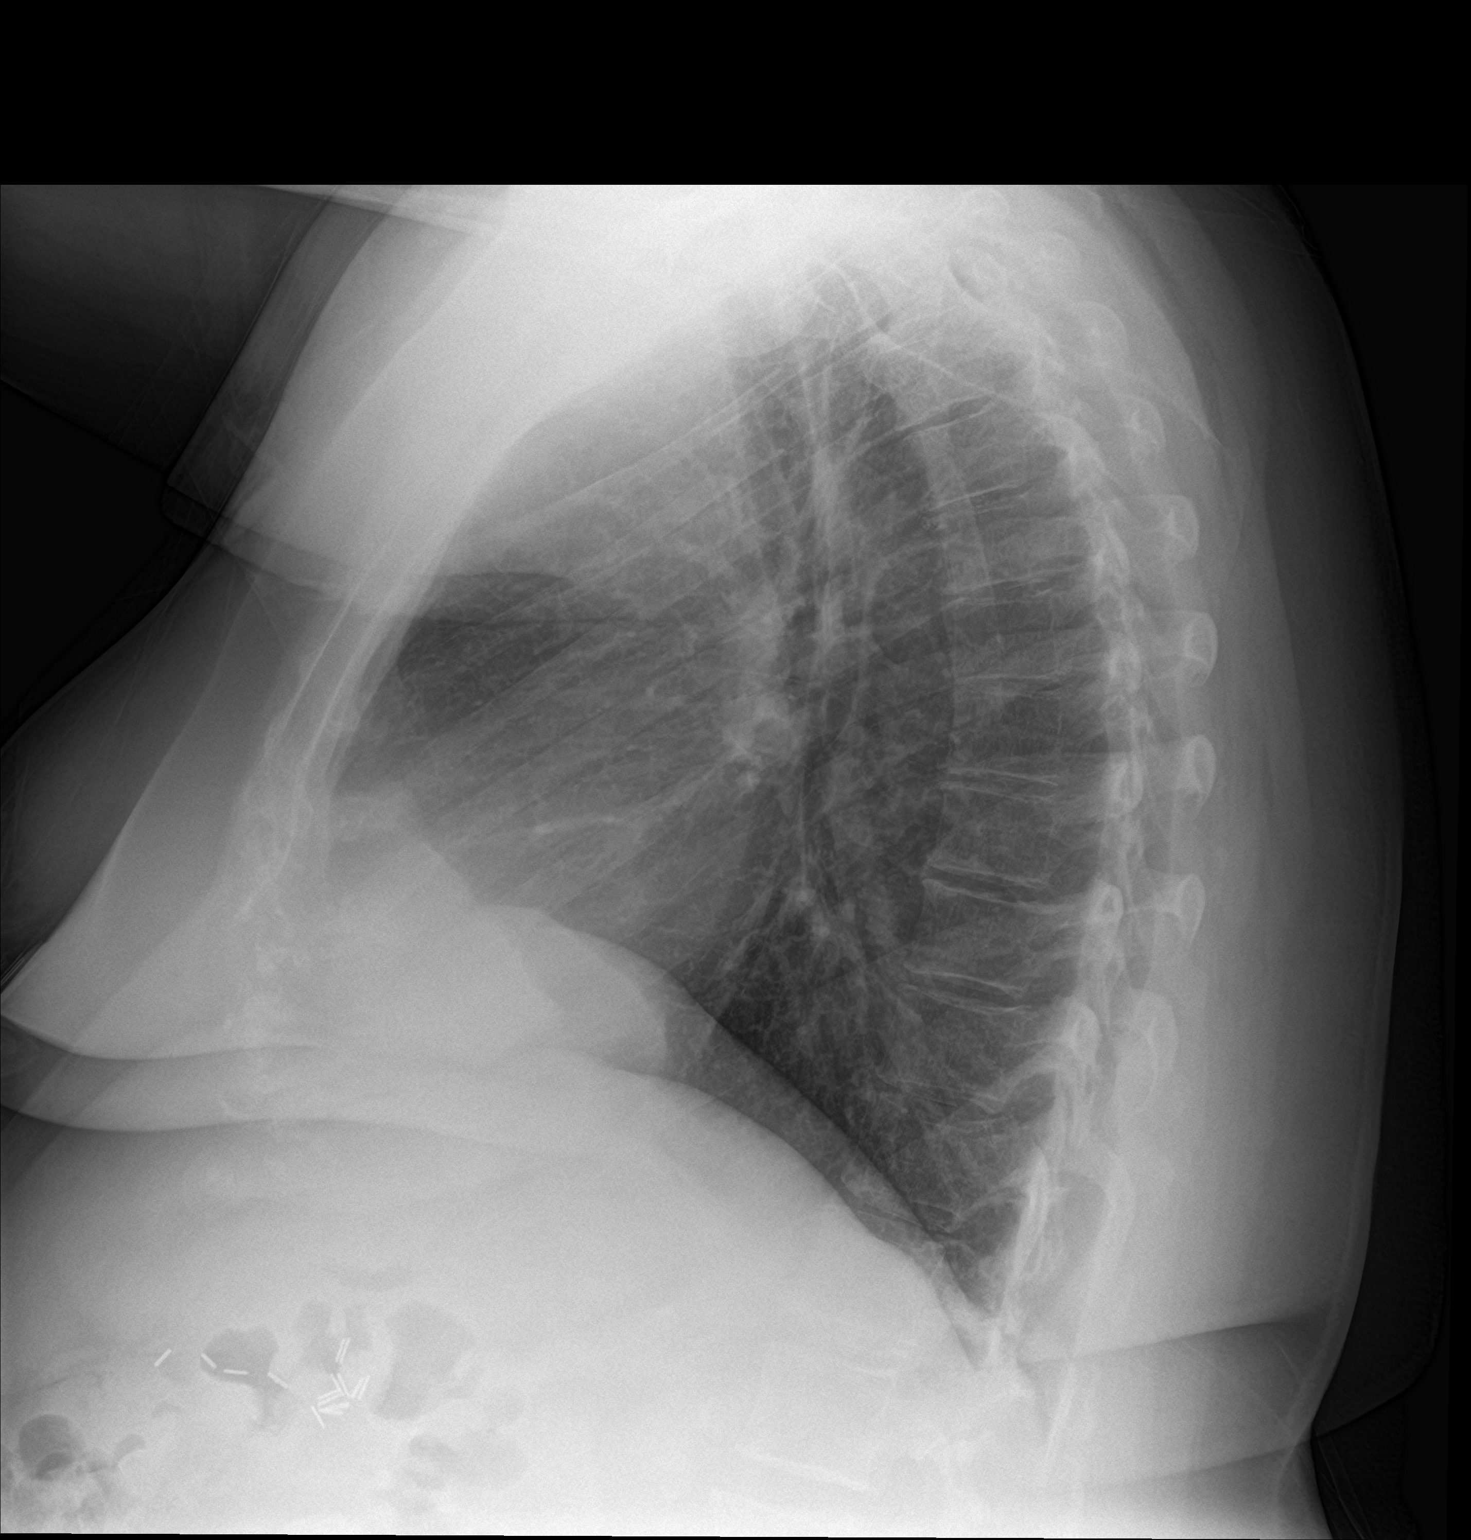

[2 of 2 positions shown; findings below may reference images not displayed]

FINDINGS: The heart size and mediastinal contours are within normal limits.
Both lungs are clear. The visualized skeletal structures are
unremarkable.
IMPRESSION: No active cardiopulmonary disease.

## 2020-11-27 ENCOUNTER — Other Ambulatory Visit: Payer: Self-pay

## 2020-11-27 MED ORDER — CLONAZEPAM 0.5 MG PO TABS: 0.5000 mg | ORAL_TABLET | Freq: Two times a day (BID) | ORAL | 2 refills | Status: DC | PRN

## 2020-12-13 ENCOUNTER — Other Ambulatory Visit: Payer: Self-pay

## 2020-12-13 ENCOUNTER — Ambulatory Visit (INDEPENDENT_AMBULATORY_CARE_PROVIDER_SITE_OTHER): Payer: 59 | Admitting: Physician Assistant

## 2020-12-13 ENCOUNTER — Ambulatory Visit (INDEPENDENT_AMBULATORY_CARE_PROVIDER_SITE_OTHER): Payer: 59 | Admitting: Mental Health

## 2020-12-13 ENCOUNTER — Encounter: Payer: Self-pay | Admitting: Physician Assistant

## 2020-12-13 DIAGNOSIS — F422 Mixed obsessional thoughts and acts: Secondary | ICD-10-CM

## 2020-12-13 DIAGNOSIS — F3341 Major depressive disorder, recurrent, in partial remission: Secondary | ICD-10-CM | POA: Diagnosis not present

## 2020-12-13 DIAGNOSIS — F411 Generalized anxiety disorder: Secondary | ICD-10-CM

## 2020-12-13 MED ORDER — LAMOTRIGINE 150 MG PO TABS: 150.0000 mg | ORAL_TABLET | Freq: Every evening | ORAL | 5 refills | Status: DC

## 2020-12-13 MED ORDER — LAMOTRIGINE 100 MG PO TABS: 100.0000 mg | ORAL_TABLET | Freq: Every morning | ORAL | 5 refills | Status: DC

## 2020-12-13 NOTE — Progress Notes (Signed)
      [facility] Counselor/Therapist Progress Note   Patient ID: Rebecca Roth, MRN: [id number]  Date: 12/13/20  Timespent: 46 minutes  Treatment Type: Individual  Mental Status Exam:   Appearance:   Casual   Behavior:  Appropriate  Motor:  Normal  Speech/Language:   Normal Rate  Affect:  Full Range  Mood:  Depressed, tearful  Thought process:  Coherent and Relevant  Thought content:   Logical  Perceptual disturbances:   Normal  Orientation:  Full (Time, Place, and Person)  Attention:  Good  Concentration:  good  Memory:  Immediate  Fund of knowledge:   Good  Insight:   Good  Judgment:   Good  Impulse Control:  good    Reported Symptoms:  anxiety,  deficits with attention and concentration, history of panic attacks  Risk Assessment: Danger to Self: No Self-injurious Behavior: No Danger to Others: No Duty to Warn:no Physical Aggression / Violence:No  Access to Firearms a concern: No  Gang Involvement:No  Patient / guardian was educated about steps to take if suicide or homicide risk level increases between visits. While future psychiatric events cannot be accurately predicted, the patient does not currently require acute inpatient psychiatric care and does not currently meet [facility] involuntary commitment criteria.  Subjective: Patient progress and recent events were assessed. Patient shared how she continues to make herself described positive changes. She reports not feeling depressed recently and has been managing with stress as it comes well. Report some mild day-to-day stress at work which she validates to be expected at times. She shared family relationships, which are intact and supportive. Continues to have some worries about her grandson who comes with some appetite disturbance. It's your ports continued improvements on some of her [place]'s to engaged in number counting. She stated that it persists but it much lower  frequency. We review coping skills, thought blocking, grounding in five. She agreed to continue to utilize between sessions.    Interventions:CBT, Solution Focused and Strength-based  Diagnosis:   ICD-10-CM   1. Mixed obsessional thoughts and acts  F42.2      Plan:  1.  Patient to continue to engage in individual counseling 2-4 times a month or as needed. 2.  Patient to identify and apply CBT, coping skills learned in session to decrease depression and anxiety symptoms. 3.  Patient to improve effective communication and assertiveness with others. 4.  Patient to decrease obsessive compulsive behaviors with systematic desensitization as discussed in session. 5.  Patient to contact this office, go to the local ED or call 911 if a crisis or emergency develops between visits.  Waldron Session, [facility]

## 2020-12-13 NOTE — Progress Notes (Signed)
[facility] Med Check  Patient ID: Rebecca Roth,  MRN: [id number]  PCP: Jim Like, NP  Date of Evaluation: 12/13/2020 Time spent:30 minutes  Chief Complaint:  Chief Complaint    Anxiety; Depression; Insomnia      HISTORY/CURRENT STATUS: HPI For routine med check.  States she is doing really well.  Anxiety is very well controlled.  She still needs the Klonopin twice a day most days and it still helps a lot.  She is still working part-time.  Enjoys that.  Things at home with her family are going well.  Patient denies loss of interest in usual activities and is able to enjoy things.  Denies decreased energy or motivation.  Appetite has not changed.  No extreme sadness, tearfulness, or feelings of hopelessness.  Denies any changes in concentration, making decisions or remembering things.  Denies suicidal or homicidal thoughts.  Patient denies increased energy with decreased need for sleep, no increased talkativeness, no racing thoughts, no impulsivity or risky behaviors, no increased spending, no increased libido, no grandiosity, no increased irritability or anger, and no hallucinations.  Denies dizziness, syncope, seizures, numbness, tingling, tremor, tics, unsteady gait, slurred speech, confusion. Denies muscle or joint pain, stiffness, or dystonia.  Individual Medical History/ Review of Systems: Changes? :Yes   Being checked for sleep apnea. Sleep study will be done soon.  Hgb A1C is high.   Past medications for mental health diagnoses include: Cymbalta, Depakote, Effexor, Celexa Wellbutrin, Abilify, Zoloft ? Lexapro?, Prozac, Buspar, Klonopin, Rexulti.  Allergies: Ace inhibitors and Sulfa antibiotics  Current Medications:  Current Outpatient Medications:  .  albuterol (VENTOLIN HFA) 108 (90 Base) MCG/ACT inhaler, Inhale 2 puffs into the lungs every 6 (six) hours as needed for wheezing or shortness of breath., Disp: , Rfl:  .  aspirin EC 81 MG tablet, Take 81 mg by  mouth daily. Swallow whole., Disp: , Rfl:  .  atorvastatin (LIPITOR) 40 MG tablet, Take 1 tablet (40 mg total) by mouth daily., Disp: 30 tablet, Rfl: 2 .  BAYER CONTOUR TEST test strip, , Disp: , Rfl:  .  brexpiprazole (REXULTI) 2 MG TABS tablet, Take 1 tablet (2 mg total) by mouth daily., Disp: 30 tablet, Rfl: 5 .  busPIRone (BUSPAR) 30 MG tablet, Take 1 tablet (30 mg total) by mouth 2 (two) times daily., Disp: 60 tablet, Rfl: 5 .  clonazePAM (KLONOPIN) 0.5 MG tablet, Take 1 tablet (0.5 mg total) by mouth 2 (two) times daily as needed for anxiety., Disp: 30 tablet, Rfl: 2 .  EMGALITY 120 MG/ML SOAJ, SMARTSIG:1 SUB-Q, Disp: , Rfl:  .  etodolac (LODINE) 400 MG tablet, TAKE 1 TABLET BY MOUTH TWICE A DAY WITH FOOD (Patient taking differently: Take 200 mg by mouth 2 (two) times daily.), Disp: 60 tablet, Rfl: 2 .  fenofibrate 54 MG tablet, TAKE 1 TABLET BY MOUTH EVERY DAY (Patient taking differently: Take 54 mg by mouth daily.), Disp: 30 tablet, Rfl: 2 .  FLUoxetine (PROZAC) 40 MG capsule, Take 2 capsules (80 mg total) by mouth daily., Disp: 60 capsule, Rfl: 5 .  furosemide (LASIX) 40 MG tablet, Take 40 mg by mouth daily., Disp: , Rfl:  .  Insulin Pen Needle (SURE COMFORT PEN NEEDLES) 31G X 5 MM MISC, Use as directed, Disp: 30 each, Rfl: 1 .  Lancet Devices (SIMPLE DIAGNOSTICS LANCING DEV) MISC, , Disp: , Rfl:  .  LEVEMIR FLEXTOUCH 100 UNIT/ML Pen, Inject 80 Units into the skin 2 (two) times daily. , Disp: ,  Rfl:  .  levocetirizine (XYZAL) 5 MG tablet, Take 5 mg by mouth daily., Disp: , Rfl:  .  levothyroxine (SYNTHROID) 137 MCG tablet, Take 137 mcg by mouth daily., Disp: , Rfl:  .  montelukast (SINGULAIR) 10 MG tablet, TAKE 1 TABLET BY MOUTH EVERY DAY (Patient taking differently: Take 10 mg by mouth daily.), Disp: 90 tablet, Rfl: 1 .  pantoprazole (PROTONIX) 40 MG tablet, TAKE 1 TABLET BY MOUTH TWICE A DAY (Patient taking differently: Take 40 mg by mouth 2 (two) times daily.), Disp: 60 tablet, Rfl: 2 .   propranolol ER (INDERAL LA) 60 MG 24 hr capsule, TAKE 1 CAPSULE BY MOUTH EVERY DAY (Patient taking differently: Take 60 mg by mouth daily.), Disp: 30 capsule, Rfl: 1 .  QUEtiapine (SEROQUEL) 25 MG tablet, Take 0.5-1 tablets (12.5-25 mg total) by mouth at bedtime. (Patient taking differently: Take 12.5-25 mg by mouth at bedtime. prn), Disp: 30 tablet, Rfl: 5 .  SYNJARDY XR 25-1000 MG TB24, TAKE 1 TABLET BY MOUTH EVERY DAY IN THE MORNING (Patient taking differently: Take 1 tablet by mouth daily.), Disp: 30 tablet, Rfl: 2 .  TRULICITY 0.75 MG/0.5ML SOPN, Take 0.75 mg by mouth once a week., Disp: , Rfl:  .  valsartan (DIOVAN) 80 MG tablet, Take 1 tablet (80 mg total) by mouth daily., Disp: 90 tablet, Rfl: 0 .  Vitamin D, Ergocalciferol, (DRISDOL) 50000 UNITS CAPS capsule, Take 50,000 Units by mouth every Tuesday. At bedtime, Disp: , Rfl:  .  BREO ELLIPTA 200-25 MCG/INH AEPB, Inhale 1 puff into the lungs daily as needed (For shortness of breath).  (Patient not taking: Reported on 12/13/2020), Disp: , Rfl: 0 .  lamoTRIgine (LAMICTAL) 100 MG tablet, Take 1 tablet (100 mg total) by mouth in the morning., Disp: 30 tablet, Rfl: 5 .  lamoTRIgine (LAMICTAL) 150 MG tablet, Take 1 tablet (150 mg total) by mouth every evening., Disp: 30 tablet, Rfl: 5 .  zinc gluconate 50 MG tablet, Take 50 mg by mouth daily. (Patient not taking: Reported on 12/13/2020), Disp: , Rfl:  Medication Side Effects: none  Family Medical/ Social History: Changes? No  MENTAL HEALTH EXAM:  There were no vitals taken for this visit.There is no height or weight on file to calculate BMI.  General Appearance: Casual, Neat, Well Groomed and Obese  Eye Contact:  Good  Speech:  Clear and Coherent and Normal Rate  Volume:  Normal  Mood:  Euthymic  Affect:  Appropriate  Thought Process:  Goal Directed and Descriptions of Associations: Intact  Orientation:  Full (Time, Place, and Person)  Thought Content: Logical   Suicidal Thoughts:  No   Homicidal Thoughts:  No  Memory:  WNL  Judgement:  Good  Insight:  Good  Psychomotor Activity:  Normal  Concentration:  Concentration: Good  Recall:  Good  Fund of Knowledge: Good  Language: Good  Assets:  Desire for Improvement  ADL's:  Intact  Cognition: WNL  Prognosis:  Good   Most recent pertinent labs: 11/12/2020 CBC was completely normal 06/25/2020 BMP all normal except for glucose which was 276 and creatinine was slightly elevated at 1.07  DIAGNOSES:    ICD-10-CM   1. Generalized anxiety disorder  F41.1   2. Recurrent major depressive disorder, in partial remission (HCC)  F33.41   3. Mixed obsessional thoughts and acts  F42.2     Receiving Psychotherapy: No    RECOMMENDATIONS:  PDMP was reviewed. I provided 30 minutes of face-to-face time during this encounter, which  was spent discussing her diagnosis and treatment plan, reviewing recent labs, and her charting. I am glad to see her doing so well!  No changes will be made in her medication. Continue Rexulti 2 mg, 1 p.o. daily. Continue BuSpar 30 mg, 1 p.o. twice daily. Continue Klonopin 0.5 mg, 1 p.o. twice daily as needed. Continue Prozac 40 mg, 1 p.o. daily. Continue Lamictal 100 mg, 1 p.o. every morning. Continue Lamictal 150 mg, 1 p.o. nightly. Continue propranolol ER 60 mg, 1 p.o. daily (per another provider) Continue Seroquel 25 mg, 1/2-1 nightly for sleep. Continue vitamin D, add multivitamin and B complex.  Fish oil also. Needs carbamazepine level ordered at next visit. Continue therapy with Elio Forget, [facility]. Return in 4 months.  Melony Overly, PA-C

## 2021-01-14 ENCOUNTER — Telehealth: Payer: Self-pay

## 2021-01-14 NOTE — Telephone Encounter (Signed)
Prior authorization submitted and approved for REXULTI 2 MG effective 01/14/2021-01/13/2022 with [employer] ID# [id number]

## 2021-03-10 ENCOUNTER — Other Ambulatory Visit: Payer: Self-pay

## 2021-03-10 MED ORDER — FLUOXETINE HCL 40 MG PO CAPS: 80.0000 mg | ORAL_CAPSULE | Freq: Every day | ORAL | 5 refills | Status: DC

## 2021-03-18 ENCOUNTER — Other Ambulatory Visit: Payer: Self-pay | Admitting: Physician Assistant

## 2021-04-08 ENCOUNTER — Other Ambulatory Visit: Payer: Self-pay | Admitting: Physician Assistant

## 2021-04-14 ENCOUNTER — Other Ambulatory Visit: Payer: Self-pay | Admitting: Physician Assistant

## 2021-04-16 ENCOUNTER — Other Ambulatory Visit: Payer: Self-pay | Admitting: Physician Assistant

## 2021-04-17 ENCOUNTER — Other Ambulatory Visit: Payer: Self-pay

## 2021-04-17 MED ORDER — LAMOTRIGINE 100 MG PO TABS: 100.0000 mg | ORAL_TABLET | Freq: Every morning | ORAL | 0 refills | Status: DC

## 2021-04-18 ENCOUNTER — Other Ambulatory Visit: Payer: Self-pay

## 2021-04-18 ENCOUNTER — Encounter: Payer: Self-pay | Admitting: Physician Assistant

## 2021-04-18 ENCOUNTER — Ambulatory Visit (INDEPENDENT_AMBULATORY_CARE_PROVIDER_SITE_OTHER): Payer: 59 | Admitting: Physician Assistant

## 2021-04-18 ENCOUNTER — Ambulatory Visit (INDEPENDENT_AMBULATORY_CARE_PROVIDER_SITE_OTHER): Payer: 59 | Admitting: Mental Health

## 2021-04-18 DIAGNOSIS — F411 Generalized anxiety disorder: Secondary | ICD-10-CM | POA: Diagnosis not present

## 2021-04-18 DIAGNOSIS — F3342 Major depressive disorder, recurrent, in full remission: Secondary | ICD-10-CM

## 2021-04-18 DIAGNOSIS — F422 Mixed obsessional thoughts and acts: Secondary | ICD-10-CM

## 2021-04-18 MED ORDER — LAMOTRIGINE 100 MG PO TABS: 100.0000 mg | ORAL_TABLET | Freq: Every morning | ORAL | 5 refills | Status: DC

## 2021-04-18 MED ORDER — BREXPIPRAZOLE 2 MG PO TABS: 2.0000 mg | ORAL_TABLET | Freq: Every day | ORAL | 5 refills | Status: DC

## 2021-04-18 NOTE — Progress Notes (Signed)
[facility] Med Check  Patient ID: Rebecca Roth,  MRN: [id number]  PCP: Jim Like, NP  Date of Evaluation: 04/18/2021 Time spent:30 minutes  Chief Complaint:  Chief Complaint    Anxiety; Depression; Insomnia; Follow-up      HISTORY/CURRENT STATUS: HPI For routine med check.  Doing great! Feels really good. Continues to work part-time. Sleeps well.  Able to enjoy things.  Energy and motivation are good.  She is not isolating.  Does not cry easily.  Anxiety is very well controlled.  Denies suicidal or homicidal thoughts.  Patient denies increased energy with decreased need for sleep, no increased talkativeness, no racing thoughts, no impulsivity or risky behaviors, no increased spending, no increased libido, no grandiosity, no increased irritability or anger, and no hallucinations.  Denies dizziness, syncope, seizures, numbness, tingling, tremor, tics, unsteady gait, slurred speech, confusion. Denies muscle or joint pain, stiffness, or dystonia. Denies unexplained weight loss, frequent infections, or sores that heal slowly.  No polyphagia, polydipsia, or polyuria. Denies visual changes or paresthesias.   Individual Medical History/ Review of Systems: Changes? :Yes  new dx of OSA. Using CPAP and feels so much better!  Past medications for mental health diagnoses include: Cymbalta, Depakote, Effexor, Celexa Wellbutrin, Abilify, Zoloft ? Lexapro?, Prozac, Buspar, Klonopin, Rexulti.  Allergies: Ace inhibitors and Sulfa antibiotics  Current Medications:  Current Outpatient Medications:  .  aspirin EC 81 MG tablet, Take 81 mg by mouth daily. Swallow whole., Disp: , Rfl:  .  atorvastatin (LIPITOR) 40 MG tablet, Take 1 tablet (40 mg total) by mouth daily., Disp: 30 tablet, Rfl: 2 .  busPIRone (BUSPAR) 30 MG tablet, TAKE 1 TABLET (30 MG TOTAL) BY MOUTH 2 (TWO) TIMES DAILY., Disp: 180 tablet, Rfl: 0 .  clonazePAM (KLONOPIN) 0.5 MG tablet, Take 1 tablet (0.5 mg total) by  mouth 2 (two) times daily as needed for anxiety., Disp: 30 tablet, Rfl: 2 .  etodolac (LODINE) 400 MG tablet, TAKE 1 TABLET BY MOUTH TWICE A DAY WITH FOOD (Patient taking differently: Take 200 mg by mouth 2 (two) times daily.), Disp: 60 tablet, Rfl: 2 .  fenofibrate 54 MG tablet, TAKE 1 TABLET BY MOUTH EVERY DAY (Patient taking differently: Take 54 mg by mouth daily.), Disp: 30 tablet, Rfl: 2 .  FLUoxetine (PROZAC) 40 MG capsule, Take 2 capsules (80 mg total) by mouth daily., Disp: 60 capsule, Rfl: 5 .  furosemide (LASIX) 40 MG tablet, Take 40 mg by mouth daily., Disp: , Rfl:  .  Insulin Pen Needle (SURE COMFORT PEN NEEDLES) 31G X 5 MM MISC, Use as directed, Disp: 30 each, Rfl: 1 .  lamoTRIgine (LAMICTAL) 150 MG tablet, Take 1 tablet (150 mg total) by mouth every evening., Disp: 30 tablet, Rfl: 5 .  LEVEMIR FLEXTOUCH 100 UNIT/ML Pen, Inject 80 Units into the skin 2 (two) times daily. , Disp: , Rfl:  .  levocetirizine (XYZAL) 5 MG tablet, Take 5 mg by mouth daily., Disp: , Rfl:  .  levothyroxine (SYNTHROID) 137 MCG tablet, Take 137 mcg by mouth daily., Disp: , Rfl:  .  montelukast (SINGULAIR) 10 MG tablet, TAKE 1 TABLET BY MOUTH EVERY DAY (Patient taking differently: Take 10 mg by mouth daily.), Disp: 90 tablet, Rfl: 1 .  pantoprazole (PROTONIX) 40 MG tablet, TAKE 1 TABLET BY MOUTH TWICE A DAY (Patient taking differently: Take 40 mg by mouth 2 (two) times daily.), Disp: 60 tablet, Rfl: 2 .  propranolol ER (INDERAL LA) 60 MG 24 hr capsule, TAKE 1  CAPSULE BY MOUTH EVERY DAY (Patient taking differently: Take 60 mg by mouth daily.), Disp: 30 capsule, Rfl: 1 .  SYNJARDY XR 25-1000 MG TB24, TAKE 1 TABLET BY MOUTH EVERY DAY IN THE MORNING (Patient taking differently: Take 1 tablet by mouth daily.), Disp: 30 tablet, Rfl: 2 .  valsartan (DIOVAN) 80 MG tablet, Take 1 tablet (80 mg total) by mouth daily., Disp: 90 tablet, Rfl: 0 .  Vitamin D, Ergocalciferol, (DRISDOL) 50000 UNITS CAPS capsule, Take 50,000 Units  by mouth every Tuesday. At bedtime, Disp: , Rfl:  .  albuterol (VENTOLIN HFA) 108 (90 Base) MCG/ACT inhaler, Inhale 2 puffs into the lungs every 6 (six) hours as needed for wheezing or shortness of breath. (Patient not taking: Reported on 04/18/2021), Disp: , Rfl:  .  BAYER CONTOUR TEST test strip, , Disp: , Rfl:  .  BREO ELLIPTA 200-25 MCG/INH AEPB, Inhale 1 puff into the lungs daily as needed (For shortness of breath).  (Patient not taking: No sig reported), Disp: , Rfl: 0 .  brexpiprazole (REXULTI) 2 MG TABS tablet, Take 1 tablet (2 mg total) by mouth daily., Disp: 30 tablet, Rfl: 5 .  carvedilol (COREG) 6.25 MG tablet, Take 6.25 mg by mouth 2 (two) times daily., Disp: , Rfl:  .  EMGALITY 120 MG/ML SOAJ, SMARTSIG:1 SUB-Q (Patient not taking: Reported on 04/18/2021), Disp: , Rfl:  .  lamoTRIgine (LAMICTAL) 100 MG tablet, Take 1 tablet (100 mg total) by mouth in the morning., Disp: 30 tablet, Rfl: 5 .  Lancet Devices (SIMPLE DIAGNOSTICS LANCING DEV) MISC, , Disp: , Rfl:  .  OZEMPIC, 0.25 OR 0.5 MG/DOSE, 2 MG/1.5ML SOPN, Inject into the skin., Disp: , Rfl:  .  QUEtiapine (SEROQUEL) 25 MG tablet, TAKE 1/2 TO 1 TABLET BY MOUTH AT BEDTIME (Patient not taking: Reported on 04/18/2021), Disp: 30 tablet, Rfl: 1 .  TRULICITY 0.75 MG/0.5ML SOPN, Take 0.75 mg by mouth once a week. (Patient not taking: Reported on 04/18/2021), Disp: , Rfl:  .  zinc gluconate 50 MG tablet, Take 50 mg by mouth daily. (Patient not taking: No sig reported), Disp: , Rfl:  Medication Side Effects: none  Family Medical/ Social History: Changes? No  MENTAL HEALTH EXAM:  There were no vitals taken for this visit.There is no height or weight on file to calculate BMI.  General Appearance: Casual, Neat, Well Groomed and Obese  Eye Contact:  Good  Speech:  Clear and Coherent and Normal Rate  Volume:  Normal  Mood:  Euthymic  Affect:  Appropriate  Thought Process:  Goal Directed and Descriptions of Associations: Intact  Orientation:   Full (Time, Place, and Person)  Thought Content: Logical   Suicidal Thoughts:  No  Homicidal Thoughts:  No  Memory:  WNL  Judgement:  Good  Insight:  Good  Psychomotor Activity:  Normal  Concentration:  Concentration: Good  Recall:  Good  Fund of Knowledge: Good  Language: Good  Assets:  Desire for Improvement  ADL's:  Intact  Cognition: WNL  Prognosis:  Good    DIAGNOSES:    ICD-10-CM   1. Mixed obsessional thoughts and acts  F42.2   2. Generalized anxiety disorder  F41.1   3. Recurrent major depressive disorder, in full remission (HCC)  F33.42     Receiving Psychotherapy: No    RECOMMENDATIONS:  PDMP was unable to be reviewed. I provided 30 mins of face to face time during this encounter including time spent in records review and charting. I'm glad to  see her doing so well!  No med changes are necessary. Continue Rexulti 2 mg, 1 p.o. daily. Continue BuSpar 30 mg, 1 p.o. twice daily. Continue Klonopin 0.5 mg, 1 p.o. twice daily as needed. Continue Prozac 40 mg, 2 p.o. daily. Continue Lamictal 100 mg, 1 p.o. every morning. Continue Lamictal 150 mg, 1 p.o. nightly. Continue propranolol ER 60 mg, 1 p.o. daily (per another provider) Continue Seroquel 25 mg, 1/2-1 nightly for sleep. Continue vitamin D, add multivitamin and B complex.  Fish oil also. Continue therapy with Elio Forget, [facility]. Return in 4 months.  Melony Overly, PA-C

## 2021-04-18 NOTE — Progress Notes (Signed)
      [facility] Counselor/Therapist Progress Note   Patient ID: Rebecca Roth, MRN: [id number]  Date: 04/18/20  Timespent: 50 minutes  Treatment Type: Individual  Mental Status Exam:   Appearance:   Casual   Behavior:  Appropriate  Motor:  Normal  Speech/Language:   Normal Rate  Affect:  Full Range  Mood:   Euthymic  Thought process:  Coherent and Relevant  Thought content:   Logical  Perceptual disturbances:   Normal  Orientation:  Full (Time, Place, and Person)  Attention:  Good  Concentration:  good  Memory:   Intact  Fund of knowledge:   Good  Insight:   Good  Judgment:   Good  Impulse Control:  good    Reported Symptoms:  anxiety,  deficits with attention and concentration, history of panic attacks  Risk Assessment: Danger to Self: No Self-injurious Behavior: No Danger to Others: No Duty to Warn:no Physical Aggression / Violence:No  Access to Firearms a concern: No  Gang Involvement:No  Patient / guardian was educated about steps to take if suicide or homicide risk level increases between visits. While future psychiatric events cannot be accurately predicted, the patient does not currently require acute inpatient psychiatric care and does not currently meet [facility] involuntary commitment criteria.  Subjective: Patient shared progress since last visit which was about 3 months ago.  She shared family concerns related to her 2 adult children and their having a strained relationship where she went on to provide details and events related.  Facilitated her identifying calming cognitions with which she can be mindful of to keep her anxiety and some sad feelings manageable related to her concerns about their relationship.  She also has some ongoing worry about her grandson related to his dietary challenges.  She stated that he has made some incremental improvements by eating more which has been a concern for the last few years.  We  collaboratively explore ways she can be supportive of him and her daughter who is his mother.  She reports making progress with some of her tendencies toward obsessive-compulsive counting with thought stopping and coping through distracting herself to refocus her thoughts.  Interventions:CBT, Solution Focused and Strength-based  Diagnosis:   ICD-10-CM   1. Mixed obsessional thoughts and acts  F42.2      Plan:  1.  Patient to continue to engage in individual counseling 2-4 times a month or as needed. 2.  Patient to identify and apply CBT, coping skills learned in session to decrease depression and anxiety symptoms. 3.  Patient to improve effective communication and assertiveness with others. 4.  Patient to decrease obsessive compulsive behaviors with systematic desensitization as discussed in session. 5.  Patient to contact this office, go to the local ED or call 911 if a crisis or emergency develops between visits.  Waldron Session, [facility]

## 2021-06-03 ENCOUNTER — Other Ambulatory Visit: Payer: Self-pay | Admitting: Physician Assistant

## 2021-06-04 NOTE — Telephone Encounter (Signed)
Klonopin last filled 01/24/21 appt 08/22/21

## 2021-08-22 ENCOUNTER — Ambulatory Visit (INDEPENDENT_AMBULATORY_CARE_PROVIDER_SITE_OTHER): Payer: 59 | Admitting: Mental Health

## 2021-08-22 ENCOUNTER — Ambulatory Visit (INDEPENDENT_AMBULATORY_CARE_PROVIDER_SITE_OTHER): Payer: 59 | Admitting: Physician Assistant

## 2021-08-22 ENCOUNTER — Other Ambulatory Visit: Payer: Self-pay

## 2021-08-22 ENCOUNTER — Encounter: Payer: Self-pay | Admitting: Physician Assistant

## 2021-08-22 DIAGNOSIS — F411 Generalized anxiety disorder: Secondary | ICD-10-CM

## 2021-08-22 DIAGNOSIS — F422 Mixed obsessional thoughts and acts: Secondary | ICD-10-CM

## 2021-08-22 DIAGNOSIS — G4733 Obstructive sleep apnea (adult) (pediatric): Secondary | ICD-10-CM

## 2021-08-22 DIAGNOSIS — F3342 Major depressive disorder, recurrent, in full remission: Secondary | ICD-10-CM | POA: Diagnosis not present

## 2021-08-22 MED ORDER — REXULTI 3 MG PO TABS
3.0000 mg | ORAL_TABLET | Freq: Every day | ORAL | 1 refills | Status: DC
Start: 2021-08-22 — End: 2021-09-22

## 2021-08-22 NOTE — Progress Notes (Signed)
[facility] Med Check  Patient ID: Rebecca Roth,  MRN: [id number]  PCP: Jim Like, NP  Date of Evaluation: 08/22/2021 Time spent:30 minutes  Chief Complaint:  Chief Complaint   Anxiety; Depression; Insomnia; Follow-up      HISTORY/CURRENT STATUS: HPI For routine med check.  Ask if it is okay to increase the Rexulti up to 3 mg for maybe 1 to 2 months.  She has been just a little bit down, maybe needs a little boost in her mood.  Her Mom died in 07-26-2023 and she does not feel extremely sad or depressed because her mom.  But she feels that may have contributed to her slight decrease in energy and motivation.  She is able to enjoy things.  She is still working 2 days a week.  Appetite has not changed, she was started on Ozempic right before her mom died and had started to lose some weight.  With traveling while her mom was sick she was not as well as she had been.  She plans to get back on a healthier diet now.  Her symptoms of OCD are mostly controlled.  She still counts things and has memorized her multiplication tables up to the 13s.  Denies any changes in concentration, making decisions or remembering things.  Feels so much better now that she is using the CPAP.  More rested when she.  Denies suicidal or homicidal thoughts.  Reports not needing the Klonopin hardly ever.  She feels like the medications at these doses, except for the Rexulti, are working very well.  The BuSpar has been a huge help in preventing anxiety whether just a general sense of unease or panic attacks.  Patient denies increased energy with decreased need for sleep, no increased talkativeness, no racing thoughts, no impulsivity or risky behaviors, no increased spending,  no grandiosity, no increased irritability or anger, and no hallucinations.  Individual Medical History/ Review of Systems: Changes? :No    Past medications for mental health diagnoses include: Cymbalta, Depakote, Effexor, Celexa  Wellbutrin, Abilify, Zoloft ? Lexapro?, Prozac, Buspar, Klonopin, Rexulti.  Allergies: Ace inhibitors and Sulfa antibiotics  Current Medications:  Current Outpatient Medications:    aspirin EC 81 MG tablet, Take 81 mg by mouth daily. Swallow whole., Disp: , Rfl:    atorvastatin (LIPITOR) 40 MG tablet, Take 1 tablet (40 mg total) by mouth daily., Disp: 30 tablet, Rfl: 2   Brexpiprazole (REXULTI) 3 MG TABS, Take 1 tablet (3 mg total) by mouth daily., Disp: 30 tablet, Rfl: 1   busPIRone (BUSPAR) 30 MG tablet, TAKE ONE (1) TABLET BY MOUTH TWICE DAILY, Disp: 60 tablet, Rfl: 10   carvedilol (COREG) 6.25 MG tablet, Take 6.25 mg by mouth 2 (two) times daily., Disp: , Rfl:    clonazePAM (KLONOPIN) 0.5 MG tablet, TAKE 1 TABLET BY MOUTH TWICE DAILY AS NEEDED FOR ANXIETY, Disp: 30 tablet, Rfl: 2   FLUoxetine (PROZAC) 40 MG capsule, Take 2 capsules (80 mg total) by mouth daily., Disp: 60 capsule, Rfl: 5   furosemide (LASIX) 40 MG tablet, Take 40 mg by mouth daily., Disp: , Rfl:    Insulin Pen Needle (SURE COMFORT PEN NEEDLES) 31G X 5 MM MISC, Use as directed, Disp: 30 each, Rfl: 1   lamoTRIgine (LAMICTAL) 100 MG tablet, Take 1 tablet (100 mg total) by mouth in the morning., Disp: 30 tablet, Rfl: 5   lamoTRIgine (LAMICTAL) 150 MG tablet, Take 1 tablet (150 mg total) by mouth every evening., Disp: 30 tablet,  Rfl: 5   Lancet Devices (SIMPLE DIAGNOSTICS LANCING DEV) MISC, , Disp: , Rfl:    LEVEMIR FLEXTOUCH 100 UNIT/ML Pen, Inject 80 Units into the skin 2 (two) times daily. , Disp: , Rfl:    levocetirizine (XYZAL) 5 MG tablet, Take 5 mg by mouth daily., Disp: , Rfl:    levothyroxine (SYNTHROID) 137 MCG tablet, Take 137 mcg by mouth daily., Disp: , Rfl:    montelukast (SINGULAIR) 10 MG tablet, TAKE 1 TABLET BY MOUTH EVERY DAY (Patient taking differently: Take 10 mg by mouth daily.), Disp: 90 tablet, Rfl: 1   OZEMPIC, 0.25 OR 0.5 MG/DOSE, 2 MG/1.5ML SOPN, Inject into the skin., Disp: , Rfl:    pantoprazole  (PROTONIX) 40 MG tablet, TAKE 1 TABLET BY MOUTH TWICE A DAY (Patient taking differently: Take 40 mg by mouth 2 (two) times daily.), Disp: 60 tablet, Rfl: 2   propranolol ER (INDERAL LA) 60 MG 24 hr capsule, TAKE 1 CAPSULE BY MOUTH EVERY DAY (Patient taking differently: Take 60 mg by mouth daily.), Disp: 30 capsule, Rfl: 1   SYNJARDY XR 25-1000 MG TB24, TAKE 1 TABLET BY MOUTH EVERY DAY IN THE MORNING (Patient taking differently: Take 1 tablet by mouth daily.), Disp: 30 tablet, Rfl: 2   valsartan (DIOVAN) 80 MG tablet, Take 1 tablet (80 mg total) by mouth daily., Disp: 90 tablet, Rfl: 0   Vitamin D, Ergocalciferol, (DRISDOL) 50000 UNITS CAPS capsule, Take 50,000 Units by mouth every Tuesday. At bedtime, Disp: , Rfl:    albuterol (VENTOLIN HFA) 108 (90 Base) MCG/ACT inhaler, Inhale 2 puffs into the lungs every 6 (six) hours as needed for wheezing or shortness of breath. (Patient not taking: No sig reported), Disp: , Rfl:    BAYER CONTOUR TEST test strip, , Disp: , Rfl:    BREO ELLIPTA 200-25 MCG/INH AEPB, Inhale 1 puff into the lungs daily as needed (For shortness of breath).  (Patient not taking: No sig reported), Disp: , Rfl: 0   EMGALITY 120 MG/ML SOAJ, SMARTSIG:1 SUB-Q (Patient not taking: No sig reported), Disp: , Rfl:    etodolac (LODINE) 400 MG tablet, TAKE 1 TABLET BY MOUTH TWICE A DAY WITH FOOD (Patient not taking: Reported on 08/22/2021), Disp: 60 tablet, Rfl: 2   fenofibrate 54 MG tablet, TAKE 1 TABLET BY MOUTH EVERY DAY (Patient not taking: Reported on 08/22/2021), Disp: 30 tablet, Rfl: 2   TRULICITY 0.75 MG/0.5ML SOPN, Take 0.75 mg by mouth once a week. (Patient not taking: No sig reported), Disp: , Rfl:    zinc gluconate 50 MG tablet, Take 50 mg by mouth daily. (Patient not taking: No sig reported), Disp: , Rfl:  Medication Side Effects: none  Family Medical/ Social History: Changes? Her Mom died in 07/28/23. She lived 13 days after she was dx with leukemia.   MENTAL HEALTH EXAM:  There were  no vitals taken for this visit.There is no height or weight on file to calculate BMI.  General Appearance: Casual, Neat, Well Groomed and Obese  Eye Contact:  Good  Speech:  Clear and Coherent and Normal Rate  Volume:  Normal  Mood:  Euthymic  Affect:  Appropriate  Thought Process:  Goal Directed and Descriptions of Associations: Circumstantial  Orientation:  Full (Time, Place, and Person)  Thought Content: Logical   Suicidal Thoughts:  No  Homicidal Thoughts:  No  Memory:  WNL  Judgement:  Good  Insight:  Good  Psychomotor Activity:  Normal  Concentration:  Concentration: Good and Attention Span:  Good  Recall:  Good  Fund of Knowledge: Good  Language: Good  Assets:  Desire for Improvement  ADL's:  Intact  Cognition: WNL  Prognosis:  Good   Labs are followed by her PCP.  DIAGNOSES:    ICD-10-CM   1. Mixed obsessional thoughts and acts  F42.2     2. Generalized anxiety disorder  F41.1     3. Recurrent major depressive disorder, in full remission (HCC)  F33.42     4. Obstructive sleep apnea  G47.33        Receiving Psychotherapy: Yes   with Elio Forget, [facility].   RECOMMENDATIONS:  PDMP reviewed. Last Klonopin filled 12/26/2020. I provided 30 minutes of face to face time during this encounter, including time spent before and after the visit in records review, medical decision making, and charting.  My condolences and the loss of her mom. It is appropriate to increase the Rexulti, I think it will help with energy and motivation. Increase Rexulti to 3 mg 1 p.o. daily for 1-2 months. When she's ready to go back to 2 mg, she will let me know.  Continue BuSpar 30 mg, 1 p.o. twice daily. Continue Klonopin 0.5 mg, 1 p.o. twice daily as needed. Rarely takes it.  Continue Prozac 40 mg, 2 p.o. daily. Continue Lamictal 100 mg, 1 p.o. every morning. Continue Lamictal 150 mg, 1 p.o. nightly. Continue propranolol ER 60 mg, 1 p.o. daily (per another provider) D/C Seroquel, not  needing. Continue vitamin D, add multivitamin and B complex.  Fish oil also. Continue CPAP use. Continue therapy with Elio Forget, [facility]. Return in 3-4 months.   Melony Overly, PA-C

## 2021-08-22 NOTE — Progress Notes (Signed)
[facility] Counselor/Therapist Progress Note   Patient ID: Rebecca Roth, MRN: [id number]  Date: 08/22/21  Timespent: 51 minutes  Treatment Type: Individual  Mental Status Exam:    Appearance:    Casual     Behavior:   Appropriate  Motor:   Normal  Speech/Language:    Normal Rate  Affect:   Full Range  Mood:    Euthymic  Thought process:   Coherent and Relevant  Thought content:     Logical  Perceptual disturbances:     Normal  Orientation:   Full (Time, Place, and Person)  Attention:   Good  Concentration:   good  Memory:    Intact  Fund of knowledge:    Good  Insight:     Good  Judgment:    Good  Impulse Control:   good     Reported Symptoms:  anxiety,  deficits with attention and concentration, history of panic attacks   Risk Assessment: Danger to Self:  No Self-injurious Behavior: No Danger to Others: No Duty to Warn:no Physical Aggression / Violence:No  Access to Firearms a concern: No  Gang Involvement:No  Patient / guardian was educated about steps to take if suicide or homicide risk level increases between visits. While future psychiatric events cannot be accurately predicted, the patient does not currently require acute inpatient psychiatric care and does not currently meet [facility] involuntary commitment criteria.  Subjective: Patient shared progress since last visit which was about 3 months ago. Patient shared house she has been doing well, share good news about her grandson as he has been able to eat more variety of foods at times, some increased appetite. This area has been a struggle for him for some time and him making some progress is relieving to patient. She stated that although her two children still have a strange relationship, that she has been able to work to put it in perspective in her mind, not dwell on the issue as a way to keep a boundary interpersonally. She shared how her mother passed about a month ago. Most of the session  was centered around this loss, how she feels that she is adjusting well, having a sense of acceptance due to the very meaningful experiences she had with her mother and her final days that allowed her to let go of some of the residual anger, hurt that she carried for many years. She stated that through this loss, the connection to her siblings, specifically her sister was rekindled after many years of relational strain. She shared how they have been talking daily and plan to spend time together, all of them this weekend with what she looks forward to greatly. Facilitated patient identifying and sharing how these recent events, changes in relationships have affected her life, as well as how she wants to continue to navigate these relationships and how it's affected her life or she identified many positive aspects.   Interventions:CBT, Solution Focused and Strength-based   Diagnosis:   ICD-10-CM   1. Mixed obsessional thoughts and acts  F42.2       Plan:  1.  Patient to continue to engage in individual counseling 2-4 times a month or as needed. 2.  Patient to identify and apply CBT, coping skills learned in session to decrease depression and anxiety symptoms. 3.  Patient to improve effective communication and assertiveness with others. 4.  Patient to decrease obsessive compulsive behaviors with systematic desensitization as discussed in session. 5.  Patient to contact this office, go to the local ED or call 911 if a crisis or emergency develops between visits.  Waldron Session, [facility]

## 2021-09-16 ENCOUNTER — Other Ambulatory Visit: Payer: Self-pay | Admitting: Physician Assistant

## 2021-09-21 ENCOUNTER — Other Ambulatory Visit: Payer: Self-pay | Admitting: Physician Assistant

## 2021-11-12 ENCOUNTER — Other Ambulatory Visit: Payer: Self-pay | Admitting: Physician Assistant

## 2021-11-23 NOTE — Pre-Procedure Instructions (Signed)
PCP - Chrystie Nose Cardiologist - unsure  EKG - DOS Chest x-ray - n/a ECHO - 11/19/20 Cardiac Cath - n/a CPAP - yes  Fasting Blood Sugar:  100-115 Checks Blood Sugar:  1/day  Blood Thinner Instructions: none Aspirin Instructions: none  ERAS Protcol - yes  COVID TEST- no  Anesthesia review: no  -------------  SDW INSTRUCTIONS:  Your procedure is scheduled on 11/25/21. Please report to [facility] Main Entrance "A" at 12:30 A.M., and check in at the Admitting office. Call this number if you have problems the morning of surgery: [phone]   Remember: Do not eat  after midnight the night before your surgery  You may drink clear liquids until 12:00PM the day of your surgery.   Clear liquids allowed are: Water, Non-Citrus Juices (without pulp), Carbonated Beverages, Clear Tea, Black Coffee Only, and Gatorade   Medications to take morning of surgery with a sip of water include:   Please bring all inhalers with you the day of surgery.   As of today, STOP taking any Aspirin (unless otherwise instructed by your surgeon), Aleve, Naproxen, Ibuprofen, Motrin, Advil, Goody's, BC's, all herbal medications, fish oil, and all vitamins.  WHAT DO I DO ABOUT MY DIABETES MEDICATION?  Do not take oral diabetes medicines (pills) the morning of surgery.  THE NIGHT BEFORE SURGERY, take ____40_______ units of ___levemir________insulin.     THE MORNING OF SURGERY, take ______40_______ units of _____levemir_____insulin.  No ozempic DOS, no synjardy 11/28 or DOS  HOW TO MANAGE YOUR DIABETES BEFORE AND AFTER SURGERY  Why is it important to control my blood sugar before and after surgery? Improving blood sugar levels before and after surgery helps healing and can limit problems. A way of improving blood sugar control is eating a healthy diet by:  Eating less sugar and carbohydrates  Increasing activity/exercise  Talking with your doctor about reaching your blood sugar goals High blood  sugars (greater than 180 mg/dL) can raise your risk of infections and slow your recovery, so you will need to focus on controlling your diabetes during the weeks before surgery. Make sure that the doctor who takes care of your diabetes knows about your planned surgery including the date and location.  How do I manage my blood sugar before surgery? Check your blood sugar at least 4 times a day, starting 2 days before surgery, to make sure that the level is not too high or low.  Check your blood sugar the morning of your surgery when you wake up and every 2 hours until you get to the Short Stay unit.  If your blood sugar is less than 70 mg/dL, you will need to treat for low blood sugar: Do not take insulin. Treat a low blood sugar (less than 70 mg/dL) with  cup of clear juice (cranberry or apple), 4 glucose tablets, OR glucose gel. Recheck blood sugar in 15 minutes after treatment (to make sure it is greater than 70 mg/dL). If your blood sugar is not greater than 70 mg/dL on recheck, call [phone] for further instructions. Report your blood sugar to the short stay nurse when you get to Short Stay.  If you are admitted to the hospital after surgery: Your blood sugar will be checked by the staff and you will probably be given insulin after surgery (instead of oral diabetes medicines) to make sure you have good blood sugar levels. The goal for blood sugar control after surgery is 80-180 mg/dL.    The Morning of Surgery Do  not wear jewelry, make-up or nail polish. Do not wear lotions, powders, or perfumes/colognes, or deodorant Do not shave 48 hours prior to surgery.   Men may shave face and neck. Do not bring valuables to the hospital. [facility] is not responsible for any belongings or valuables.  If you are a smoker, DO NOT Smoke 24 hours prior to surgery  If you wear a CPAP at night please bring your mask the morning of surgery   Remember that you must have someone to transport you  home after your surgery, and remain with you for 24 hours if you are discharged the same day.  Please bring cases for contacts, glasses, hearing aids, dentures or bridgework because it cannot be worn into surgery.   Patients discharged the day of surgery will not be allowed to drive home.   Please shower the NIGHT BEFORE/MORNING OF SURGERY (use antibacterial soap like DIAL soap if possible). Wear comfortable clothes the morning of surgery. Oral Hygiene is also important to reduce your risk of infection.  Remember - BRUSH YOUR TEETH THE MORNING OF SURGERY WITH YOUR REGULAR TOOTHPASTE  Patient denies shortness of breath, fever, cough and chest pain.

## 2021-11-25 ENCOUNTER — Ambulatory Visit (HOSPITAL_COMMUNITY)
Admission: RE | Admit: 2021-11-25 | Discharge: 2021-11-25 | Disposition: A | Discharge status: Home / Self Care | Payer: 59 | Attending: Ophthalmology | Admitting: Ophthalmology

## 2021-11-25 ENCOUNTER — Ambulatory Visit (HOSPITAL_COMMUNITY): Payer: 59 | Admitting: Anesthesiology

## 2021-11-25 ENCOUNTER — Encounter (HOSPITAL_COMMUNITY): Payer: Self-pay | Admitting: Ophthalmology

## 2021-11-25 ENCOUNTER — Other Ambulatory Visit: Payer: Self-pay

## 2021-11-25 ENCOUNTER — Encounter (HOSPITAL_COMMUNITY)
Admission: RE | Disposition: A | Discharge status: Home / Self Care | Payer: Self-pay | Source: Home / Self Care | Attending: Ophthalmology

## 2021-11-25 DIAGNOSIS — D649 Anemia, unspecified: Secondary | ICD-10-CM | POA: Diagnosis not present

## 2021-11-25 DIAGNOSIS — J45909 Unspecified asthma, uncomplicated: Secondary | ICD-10-CM | POA: Insufficient documentation

## 2021-11-25 DIAGNOSIS — F418 Other specified anxiety disorders: Secondary | ICD-10-CM | POA: Diagnosis not present

## 2021-11-25 DIAGNOSIS — E039 Hypothyroidism, unspecified: Secondary | ICD-10-CM | POA: Insufficient documentation

## 2021-11-25 DIAGNOSIS — K219 Gastro-esophageal reflux disease without esophagitis: Secondary | ICD-10-CM | POA: Diagnosis not present

## 2021-11-25 DIAGNOSIS — E1136 Type 2 diabetes mellitus with diabetic cataract: Secondary | ICD-10-CM | POA: Insufficient documentation

## 2021-11-25 DIAGNOSIS — R519 Headache, unspecified: Secondary | ICD-10-CM | POA: Insufficient documentation

## 2021-11-25 DIAGNOSIS — H259 Unspecified age-related cataract: Secondary | ICD-10-CM | POA: Insufficient documentation

## 2021-11-25 DIAGNOSIS — I10 Essential (primary) hypertension: Secondary | ICD-10-CM | POA: Diagnosis not present

## 2021-11-25 DIAGNOSIS — I2510 Atherosclerotic heart disease of native coronary artery without angina pectoris: Secondary | ICD-10-CM | POA: Insufficient documentation

## 2021-11-25 DIAGNOSIS — Z7984 Long term (current) use of oral hypoglycemic drugs: Secondary | ICD-10-CM | POA: Diagnosis not present

## 2021-11-25 DIAGNOSIS — E1151 Type 2 diabetes mellitus with diabetic peripheral angiopathy without gangrene: Secondary | ICD-10-CM | POA: Insufficient documentation

## 2021-11-25 HISTORY — PX: CATARACT EXTRACTION W/PHACO: SHX586

## 2021-11-25 LAB — CBC
HCT: 39.1 % (ref 36.0–46.0)
Hemoglobin: 11.6 g/dL — ABNORMAL LOW (ref 12.0–15.0)
MCH: 26.4 pg (ref 26.0–34.0)
MCHC: 29.7 g/dL — ABNORMAL LOW (ref 30.0–36.0)
MCV: 89.1 fL (ref 80.0–100.0)
Platelets: 313 10*3/uL (ref 150–400)
RBC: 4.39 MIL/uL (ref 3.87–5.11)
RDW: 14.8 % (ref 11.5–15.5)
WBC: 7.5 10*3/uL (ref 4.0–10.5)
nRBC: 0 % (ref 0.0–0.2)

## 2021-11-25 LAB — BASIC METABOLIC PANEL
Anion gap: 11 (ref 5–15)
BUN: 18 mg/dL (ref 8–23)
CO2: 23 mmol/L (ref 22–32)
Calcium: 9 mg/dL (ref 8.9–10.3)
Chloride: 103 mmol/L (ref 98–111)
Creatinine, Ser: 1.33 mg/dL — ABNORMAL HIGH (ref 0.44–1.00)
GFR, Estimated: 45 mL/min — ABNORMAL LOW (ref >60)
Glucose, Bld: 128 mg/dL — ABNORMAL HIGH (ref 70–99)
Potassium: 4.3 mmol/L (ref 3.5–5.1)
Sodium: 137 mmol/L (ref 135–145)

## 2021-11-25 LAB — GLUCOSE, CAPILLARY
Glucose-Capillary: 143 mg/dL — ABNORMAL HIGH (ref 70–99)
Glucose-Capillary: 118 mg/dL — ABNORMAL HIGH (ref 70–99)

## 2021-11-25 SURGERY — PHACOEMULSIFICATION, CATARACT, WITH IOL INSERTION
Anesthesia: Monitor Anesthesia Care | Site: Eye | Laterality: Left

## 2021-11-25 MED ORDER — OFLOXACIN 0.3 % OP SOLN
OPHTHALMIC | Status: AC
  Filled 2021-11-25: qty 5

## 2021-11-25 MED ORDER — DEXAMETHASONE SODIUM PHOSPHATE 10 MG/ML IJ SOLN
INTRAMUSCULAR | Status: AC
  Filled 2021-11-25: qty 1

## 2021-11-25 MED ORDER — PHENYLEPHRINE 40 MCG/ML (10ML) SYRINGE FOR IV PUSH (FOR BLOOD PRESSURE SUPPORT)
PREFILLED_SYRINGE | INTRAVENOUS | Status: AC
  Filled 2021-11-25: qty 10

## 2021-11-25 MED ORDER — CHLORHEXIDINE GLUCONATE 0.12 % MT SOLN
15.0000 mL | Freq: Once | OROMUCOSAL | Status: AC
  Administered 2021-11-25: 15 mL via OROMUCOSAL

## 2021-11-25 MED ORDER — FENTANYL CITRATE (PF) 100 MCG/2ML IJ SOLN: 25.0000 ug | INTRAMUSCULAR | Status: DC | PRN

## 2021-11-25 MED ORDER — LIDOCAINE HCL 2 % IJ SOLN
INTRAMUSCULAR | Status: AC
  Filled 2021-11-25: qty 20

## 2021-11-25 MED ORDER — ONDANSETRON HCL 4 MG/2ML IJ SOLN: 4.0000 mg | Freq: Once | INTRAMUSCULAR | Status: DC | PRN

## 2021-11-25 MED ORDER — ACETYLCHOLINE CHLORIDE 20 MG IO SOLR
INTRAOCULAR | Status: AC
  Filled 2021-11-25: qty 1

## 2021-11-25 MED ORDER — ONDANSETRON HCL 4 MG/2ML IJ SOLN
INTRAMUSCULAR | Status: AC
  Filled 2021-11-25: qty 2

## 2021-11-25 MED ORDER — TROPICAMIDE 1 % OP SOLN
OPHTHALMIC | Status: AC
  Filled 2021-11-25: qty 15

## 2021-11-25 MED ORDER — ONDANSETRON HCL 4 MG/2ML IJ SOLN
INTRAMUSCULAR | Status: DC | PRN
  Administered 2021-11-25: 4 mg via INTRAVENOUS

## 2021-11-25 MED ORDER — LIDOCAINE HCL 2 % IJ SOLN
INTRAMUSCULAR | Status: DC | PRN
  Administered 2021-11-25: 5.5 mL via RETROBULBAR

## 2021-11-25 MED ORDER — SODIUM CHLORIDE 0.9 % IV SOLN: INTRAVENOUS | Status: DC

## 2021-11-25 MED ORDER — DEXAMETHASONE SODIUM PHOSPHATE 10 MG/ML IJ SOLN
INTRAMUSCULAR | Status: DC | PRN
  Administered 2021-11-25: 10 mg

## 2021-11-25 MED ORDER — LIDOCAINE 2% (20 MG/ML) 5 ML SYRINGE
INTRAMUSCULAR | Status: AC
  Filled 2021-11-25: qty 5

## 2021-11-25 MED ORDER — PHENYLEPHRINE 40 MCG/ML (10ML) SYRINGE FOR IV PUSH (FOR BLOOD PRESSURE SUPPORT)
PREFILLED_SYRINGE | INTRAVENOUS | Status: DC | PRN
  Administered 2021-11-25: 80 ug via INTRAVENOUS

## 2021-11-25 MED ORDER — PROPARACAINE HCL 0.5 % OP SOLN
1.0000 [drp] | OPHTHALMIC | Status: AC | PRN
  Administered 2021-11-25 (×3): 1 [drp] via OPHTHALMIC

## 2021-11-25 MED ORDER — PHENYLEPHRINE HCL 2.5 % OP SOLN
1.0000 [drp] | OPHTHALMIC | Status: AC | PRN
  Administered 2021-11-25 (×3): 1 [drp] via OPHTHALMIC

## 2021-11-25 MED ORDER — PROPOFOL 10 MG/ML IV BOLUS
INTRAVENOUS | Status: DC | PRN
  Administered 2021-11-25: 30 mg via INTRAVENOUS

## 2021-11-25 MED ORDER — LIDOCAINE 2% (20 MG/ML) 5 ML SYRINGE
INTRAMUSCULAR | Status: DC | PRN
  Administered 2021-11-25: 40 mg via INTRAVENOUS

## 2021-11-25 MED ORDER — EPINEPHRINE PF 1 MG/ML IJ SOLN
INTRAMUSCULAR | Status: AC
  Filled 2021-11-25: qty 1

## 2021-11-25 MED ORDER — HYALURONIDASE HUMAN 150 UNIT/ML IJ SOLN
INTRAMUSCULAR | Status: AC
  Filled 2021-11-25: qty 1

## 2021-11-25 MED ORDER — TROPICAMIDE 1 % OP SOLN
1.0000 [drp] | OPHTHALMIC | Status: AC | PRN
  Administered 2021-11-25 (×3): 1 [drp] via OPHTHALMIC

## 2021-11-25 MED ORDER — MIDAZOLAM HCL 2 MG/2ML IJ SOLN
INTRAMUSCULAR | Status: AC
  Filled 2021-11-25: qty 2

## 2021-11-25 MED ORDER — LACTATED RINGERS IV SOLN: INTRAVENOUS | Status: DC

## 2021-11-25 MED ORDER — ATROPINE SULFATE 1 % OP SOLN
OPHTHALMIC | Status: AC
  Filled 2021-11-25: qty 5

## 2021-11-25 MED ORDER — TOBRAMYCIN-DEXAMETHASONE 0.3-0.1 % OP OINT
TOPICAL_OINTMENT | OPHTHALMIC | Status: DC | PRN
  Administered 2021-11-25: 1 via OPHTHALMIC

## 2021-11-25 MED ORDER — ACETAMINOPHEN 325 MG PO TABS: 325.0000 mg | ORAL_TABLET | ORAL | Status: DC | PRN

## 2021-11-25 MED ORDER — ACETYLCHOLINE CHLORIDE 20 MG IO SOLR
INTRAOCULAR | Status: DC | PRN
  Administered 2021-11-25: 20 mg via INTRAOCULAR

## 2021-11-25 MED ORDER — NA CHONDROIT SULF-NA HYALURON 40-30 MG/ML IO SOSY
INTRAOCULAR | Status: DC | PRN
  Administered 2021-11-25: 0.5 mL via INTRAOCULAR

## 2021-11-25 MED ORDER — SODIUM HYALURONATE 10 MG/ML IO SOLUTION
PREFILLED_SYRINGE | INTRAOCULAR | Status: AC
  Filled 2021-11-25: qty 0.85

## 2021-11-25 MED ORDER — ACETAMINOPHEN 160 MG/5ML PO SOLN: 325.0000 mg | ORAL | Status: DC | PRN

## 2021-11-25 MED ORDER — OXYCODONE HCL 5 MG PO TABS: 5.0000 mg | ORAL_TABLET | Freq: Once | ORAL | Status: DC | PRN

## 2021-11-25 MED ORDER — BUPIVACAINE HCL (PF) 0.75 % IJ SOLN
INTRAMUSCULAR | Status: AC
  Filled 2021-11-25: qty 10

## 2021-11-25 MED ORDER — TOBRAMYCIN-DEXAMETHASONE 0.3-0.1 % OP OINT
TOPICAL_OINTMENT | OPHTHALMIC | Status: AC
  Filled 2021-11-25: qty 3.5

## 2021-11-25 MED ORDER — BSS PLUS IO SOLN
INTRAOCULAR | Status: AC
  Filled 2021-11-25: qty 500

## 2021-11-25 MED ORDER — EPINEPHRINE PF 1 MG/ML IJ SOLN
INTRAOCULAR | Status: DC | PRN
  Administered 2021-11-25: 500.3 mL

## 2021-11-25 MED ORDER — MIDAZOLAM HCL 2 MG/2ML IJ SOLN
INTRAMUSCULAR | Status: DC | PRN
  Administered 2021-11-25: 2 mg via INTRAVENOUS

## 2021-11-25 MED ORDER — PROPOFOL 10 MG/ML IV BOLUS
INTRAVENOUS | Status: AC
  Filled 2021-11-25: qty 20

## 2021-11-25 MED ORDER — CEFAZOLIN SUBCONJUNCTIVAL INJECTION 100 MG/0.5 ML
INJECTION | SUBCONJUNCTIVAL | Status: DC | PRN
  Administered 2021-11-25: 100 mg via SUBCONJUNCTIVAL

## 2021-11-25 MED ORDER — SODIUM HYALURONATE 10 MG/ML IO SOLUTION
PREFILLED_SYRINGE | INTRAOCULAR | Status: DC | PRN
  Administered 2021-11-25: .85 mL via INTRAOCULAR

## 2021-11-25 MED ORDER — DEXAMETHASONE SODIUM PHOSPHATE 10 MG/ML IJ SOLN
INTRAMUSCULAR | Status: DC | PRN
  Administered 2021-11-25 (×2): 2 mg via INTRAVENOUS

## 2021-11-25 MED ORDER — FENTANYL CITRATE (PF) 250 MCG/5ML IJ SOLN
INTRAMUSCULAR | Status: AC
  Filled 2021-11-25: qty 5

## 2021-11-25 MED ORDER — PROPARACAINE HCL 0.5 % OP SOLN
OPHTHALMIC | Status: AC
  Filled 2021-11-25: qty 15

## 2021-11-25 MED ORDER — CEFAZOLIN SUBCONJUNCTIVAL INJECTION 100 MG/0.5 ML
100.0000 mg | INJECTION | SUBCONJUNCTIVAL | Status: DC
  Filled 2021-11-25 (×3): qty 5

## 2021-11-25 MED ORDER — OFLOXACIN 0.3 % OP SOLN
1.0000 [drp] | OPHTHALMIC | Status: AC | PRN
  Administered 2021-11-25 (×3): 1 [drp] via OPHTHALMIC

## 2021-11-25 MED ORDER — OXYCODONE HCL 5 MG/5ML PO SOLN: 5.0000 mg | Freq: Once | ORAL | Status: DC | PRN

## 2021-11-25 MED ORDER — CHLORHEXIDINE GLUCONATE 0.12 % MT SOLN
OROMUCOSAL | Status: AC
  Filled 2021-11-25: qty 15

## 2021-11-25 MED ORDER — BSS IO SOLN
INTRAOCULAR | Status: AC
  Filled 2021-11-25: qty 15

## 2021-11-25 MED ORDER — PHENYLEPHRINE HCL 2.5 % OP SOLN
OPHTHALMIC | Status: AC
  Filled 2021-11-25: qty 2

## 2021-11-25 SURGICAL SUPPLY — 66 items
BAND WRIST GAS GREEN (MISCELLANEOUS) IMPLANT
BLADE EYE SLIT 3.0 45D BEAV (BLADE) ×2 IMPLANT
BLADE KERATOME 2.75 (BLADE) ×2 IMPLANT
BLADE MVR KNIFE 20G (BLADE) IMPLANT
BLADE STAB KNIFE 15DEG (BLADE) ×2 IMPLANT
BNDG EYE OVAL (GAUZE/BANDAGES/DRESSINGS) ×1 IMPLANT
CANNULA ANT CHAM MAIN (OPHTHALMIC RELATED) IMPLANT
CANNULA DUAL BORE 23G (CANNULA) IMPLANT
CANNULA DUALBORE 25G (CANNULA) IMPLANT
CANNULA FLEX TIP .5X22 25G (CANNULA) ×1 IMPLANT
CANNULA VLV SOFT TIP 25G (OPHTHALMIC) ×1 IMPLANT
CANNULA VLV SOFT TIP 25GA (OPHTHALMIC) ×2 IMPLANT
CARTRIDGE A MONARCH (MISCELLANEOUS) IMPLANT
CARTRIDGE C MONARCH III (MISCELLANEOUS) IMPLANT
CAUTERY EYE LOW TEMP 1300F FIN (OPHTHALMIC RELATED) IMPLANT
CLSR STERI-STRIP ANTIMIC 1/2X4 (GAUZE/BANDAGES/DRESSINGS) ×2 IMPLANT
COVER MAYO STAND STRL (DRAPES) IMPLANT
DRAPE RETRACTOR (MISCELLANEOUS) ×2 IMPLANT
DRAPE SHEET LG 3/4 BI-LAMINATE (DRAPES) ×2 IMPLANT
GAS WRIST BAND GREEN (MISCELLANEOUS)
GLOVE SURG SYN 7.5  E (GLOVE) ×2
GLOVE SURG SYN 7.5 E (GLOVE) ×1 IMPLANT
GLOVE SURG SYN 7.5 PF PI (GLOVE) ×1 IMPLANT
GOWN STRL REUS W/ TWL LRG LVL3 (GOWN DISPOSABLE) ×1 IMPLANT
GOWN STRL REUS W/TWL LRG LVL3 (GOWN DISPOSABLE) ×2
KIT BASIN OR (CUSTOM PROCEDURE TRAY) ×2 IMPLANT
KIT TURNOVER KIT B (KITS) ×2 IMPLANT
KNIFE CRESCENT 1.75 EDGEAHEAD (BLADE) ×2 IMPLANT
LENS IOL ACRSF IQ PC 24.0 (Intraocular Lens) IMPLANT
LENS IOL ACRYSOF IQ POST 24.0 (Intraocular Lens) ×2 IMPLANT
NDL 18GX1X1/2 (RX/OR ONLY) (NEEDLE) ×1 IMPLANT
NDL 25GX 5/8IN NON SAFETY (NEEDLE) ×1 IMPLANT
NDL 27GX1/2 REG BEVEL ECLIP (NEEDLE) ×1 IMPLANT
NDL FILTER BLUNT 18X1 1/2 (NEEDLE) ×1 IMPLANT
NDL HYPO 25GX1X1/2 BEV (NEEDLE) IMPLANT
NDL HYPO 30X.5 LL (NEEDLE) ×2 IMPLANT
NDL RETROBULBAR 25GX1.5 (NEEDLE) ×1 IMPLANT
NEEDLE 18GX1X1/2 (RX/OR ONLY) (NEEDLE) ×2 IMPLANT
NEEDLE 25GX 5/8IN NON SAFETY (NEEDLE) ×2 IMPLANT
NEEDLE 27GX1/2 REG BEVEL ECLIP (NEEDLE) ×2 IMPLANT
NEEDLE FILTER BLUNT 18X 1/2SAF (NEEDLE) ×1
NEEDLE FILTER BLUNT 18X1 1/2 (NEEDLE) ×1 IMPLANT
NEEDLE HYPO 25GX1X1/2 BEV (NEEDLE) IMPLANT
NEEDLE HYPO 30X.5 LL (NEEDLE) ×4 IMPLANT
NEEDLE RETROBULBAR 25GX1.5 (NEEDLE) ×2 IMPLANT
NS IRRIG 1000ML POUR BTL (IV SOLUTION) ×2 IMPLANT
PACK CATARACT/VITRECTOMY 25GA (OPHTHALMIC) IMPLANT
PACK FRAGMATOME (OPHTHALMIC) IMPLANT
PACK VITRECTOMY CUSTOM (CUSTOM PROCEDURE TRAY) ×2 IMPLANT
PAD ARMBOARD 7.5X6 YLW CONV (MISCELLANEOUS) ×4 IMPLANT
PAK PIK CVS CATARACT (OPHTHALMIC) IMPLANT
RING MALYGIN (MISCELLANEOUS) ×2 IMPLANT
ROLLS DENTAL (MISCELLANEOUS) IMPLANT
SHIELD EYE LENSE ONLY DISP (GAUZE/BANDAGES/DRESSINGS) ×1 IMPLANT
SOL ANTI FOG 6CC (MISCELLANEOUS) IMPLANT
SOLUTION ANTI FOG 6CC (MISCELLANEOUS)
SUT ETHILON 10 0 CS140 6 (SUTURE) ×1 IMPLANT
SUT VICRYL 7 0 TG140 8 (SUTURE) IMPLANT
SUT VICRYL 8 0 TG140 8 (SUTURE) IMPLANT
SYR 10ML LL (SYRINGE) IMPLANT
SYR 20ML LL LF (SYRINGE) ×2 IMPLANT
SYR 5ML LL (SYRINGE) ×2 IMPLANT
SYR TB 1ML LUER SLIP (SYRINGE) ×2 IMPLANT
TIP ABS 45DEG FLARED 0.9MM (TIP) ×2 IMPLANT
TIP ITREPID SGL USE BENT I/A (SUCTIONS) ×1 IMPLANT
WATER STERILE IRR 1000ML POUR (IV SOLUTION) ×2 IMPLANT

## 2021-11-25 NOTE — Discharge Instructions (Signed)
SLEEP WITH HEAD ELEVATED.  DO NOT RUB EYE.  KEEP PATCH IN PLACE UNTIL POST-OPERATIVE APPOINTMENT

## 2021-11-25 NOTE — Anesthesia Procedure Notes (Signed)
Procedure Name: MAC Date/Time: 11/25/2021 3:00 PM Performed by: Jenne Campus, CRNA Pre-anesthesia Checklist: Patient identified, Emergency Drugs available, Suction available and Patient being monitored Oxygen Delivery Method: Nasal cannula

## 2021-11-25 NOTE — H&P (Signed)
Date of examination:  11/25/21  Indication for surgery: cataract left eye  Pertinent past medical history:  Past Medical History:  Diagnosis Date   Anemia    Anxiety    Asthma    Carotid artery disease (HCC)    Carotid US 07/06/16 [facility]): 50-69% RICA stenosis, < 50% LICA stenosis    Cataract    right eye   Coronary artery disease    50% mid LCX, otherwise normal coronaries, medical therapy 12/04/15 ([place])   Depression    Diabetes mellitus without complication (HCC)    Frequent headaches    GERD (gastroesophageal reflux disease)    Hypertension    Hypothyroidism    IBS (irritable bowel syndrome)    Swelling    Thyroid disease    Wears glasses     Pertinent ocular history:  cataracts and decreased vision  Pertinent family history:  Family History  Problem Relation Age of Onset   Stroke Mother    Heart disease Mother    Pancreatic cancer Father    Kidney cancer Brother    Emphysema Maternal Grandfather     General:  Healthy appearing patient in no distress.     Eyes:    Acuity OS 20/40   cc    External: Within normal limits      Anterior segment: Within normal limits           Fundus: retina attached         Impression: Cataract left eye  Plan:  Cataract extraction with intraocular lens implant left eye  Carmela Rima, MD

## 2021-11-25 NOTE — Op Note (Signed)
Virdie Penning Piazza 11/25/2021 Diagnosis: Cataract left eye and corneal wound laceration/dehissence  Procedure:  Cataract extraction with intraocular lens implant left eye and reconstruction/repair of corneal wound/laceration Operative Eye:  left eye  Surgeon: Harrold Donath Estimated Blood Loss: minimal Specimens for Pathology:  None Complications: none   The  patient was prepped and draped in the usual fashion for ocular surgery on the  left eye .  A lid speculum was placed.  A clear corneal wound was placed at 2 o'clock for placement of Viscoat.  The cystotome and Uttrata forceps were used to make a continuous curvilinear capsullorhexis.  The lens was hydrated to attain a double ring sign.  BSS was used to hydrate the cortex and the lens mobilized.  The phaco probe was used to remove the lens.  A 24 diopter [id number] lens was placed in the capsular bag after cortical material was removed with the irrigation/aspiration cannula.  Healon was placed in the capsular bag after cortical material was removed.  Miostat was used to contrict the pupil.  Corneal laceration/dehissence was noted associated with prior radial keratotomy wound.  The corneal laceration/dehissence was closed with one 10-0 nylon suture.  Subconjunctival injections of  Dexamethasone 4mg /40ml was placed in the infero-medial quadrant.   The speculum and drapes were removed and the eye was patched with Polymixin/Bacitracin ophthalmic ointment. An eye shield was placed and the patient was transferred alert and conversant with stable vital signs to the post operative recovery area.  The patient tolerated the procedure well and no complications were noted.  0m MD

## 2021-11-25 NOTE — Anesthesia Preprocedure Evaluation (Signed)
Anesthesia Evaluation  Patient identified by MRN, date of birth, ID band Patient awake    Reviewed: Allergy & Precautions, NPO status , Patient's Chart, lab work & pertinent test results  Airway Mallampati: III  TM Distance: >3 FB Neck ROM: Full    Dental  (+) Teeth Intact, Dental Advisory Given   Pulmonary asthma ,    Pulmonary exam normal        Cardiovascular hypertension, Pt. on home beta blockers and Pt. on medications + CAD and + Peripheral Vascular Disease  Normal cardiovascular exam     Neuro/Psych  Headaches, PSYCHIATRIC DISORDERS Anxiety Depression    GI/Hepatic Neg liver ROS, GERD  ,  Endo/Other  diabetes, Well Controlled, Oral Hypoglycemic AgentsHypothyroidism   Renal/GU Renal InsufficiencyRenal disease     Musculoskeletal negative musculoskeletal ROS (+)   Abdominal (+) + obese,   Peds  Hematology  (+) Blood dyscrasia, anemia ,   Anesthesia Other Findings   Reproductive/Obstetrics negative OB ROS                                                              Anesthesia Evaluation  Patient identified by MRN, date of birth, ID band Patient awake    Reviewed: Allergy & Precautions, NPO status , Patient's Chart, lab work & pertinent test results  History of Anesthesia Complications Negative for: history of anesthetic complications  Airway Mallampati: II  TM Distance: >3 FB Neck ROM: Full    Dental  (+) Dental Advisory Given, Teeth Intact   Pulmonary asthma , neg recent URI,    breath sounds clear to auscultation       Cardiovascular hypertension, Pt. on medications and Pt. 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, neg cath 2016   Neuro/Psych  Headaches, PSYCHIATRIC DISORDERS Anxiety Depression    GI/Hepatic Neg liver ROS, GERD  Medicated and Controlled,  Endo/Other   diabetesHypothyroidism   Renal/GU negative Renal ROS     Musculoskeletal   Abdominal   Peds  Hematology  (+) anemia ,   Anesthesia Other Findings   Reproductive/Obstetrics                             Anesthesia Physical Anesthesia Plan  ASA: II  Anesthesia Plan: MAC   Post-op Pain Management:    Induction: Intravenous  PONV Risk Score and Plan: 2 and Treatment may vary due to age or medical condition and Propofol infusion  Airway Management Planned: Nasal Cannula  Additional Equipment: None  Intra-op Plan:   Post-operative Plan:   Informed Consent: I have reviewed the patients History and Physical, chart, labs and discussed the procedure including the risks, benefits and alternatives for the proposed anesthesia with the patient or authorized representative who has indicated his/her understanding and acceptance.     Dental advisory given  Plan Discussed with: CRNA and Surgeon  Anesthesia Plan Comments:         Anesthesia Quick Evaluation  Anesthesia Physical  Anesthesia Plan  ASA: III  Anesthesia Plan: MAC   Post-op Pain Management:    Induction: Intravenous  PONV Risk Score and Plan: 2 and TIVA, Propofol infusion and Treatment may vary due to  age or medical condition  Airway Management Planned: Natural Airway  Additional Equipment: None  Intra-op Plan:   Post-operative Plan:   Informed Consent: I have reviewed the patients History and Physical, chart, labs and discussed the procedure including the risks, benefits and alternatives for the proposed anesthesia with the patient or authorized representative who has indicated his/her understanding and acceptance.     Dental advisory given  Plan Discussed with: CRNA  Anesthesia Plan Comments: (PAT note written 06/26/2020 by Myra Gianotti, PA-C. )        Anesthesia Quick Evaluation

## 2021-11-25 NOTE — Transfer of Care (Signed)
Immediate Anesthesia Transfer of Care Note  Patient: Rebecca Roth  Procedure(s) Performed: CATARACT EXTRACTION AND INTRAOCULAR LENS PLACEMENT (IOC) (Left: Eye)  Patient Location: PACU  Anesthesia Type:MAC  Level of Consciousness: awake, alert , oriented and patient cooperative  Airway & Oxygen Therapy: Patient Spontanous Breathing and Patient connected to nasal cannula oxygen  Post-op Assessment: Report given to RN and Post -op Vital signs reviewed and stable  Post vital signs: Reviewed  Last Vitals:  Vitals Value Taken Time  BP 98/58 11/25/21 1631  Temp    Pulse 92 11/25/21 1633  Resp 18 11/25/21 1633  SpO2 96 % 11/25/21 1633  Vitals shown include unvalidated device data.  Last Pain:  Vitals:   11/25/21 1256  TempSrc:   PainSc: 0-No pain         Complications: No notable events documented.

## 2021-11-25 NOTE — Brief Op Note (Signed)
11/25/2021  4:47 PM  PATIENT:  Rebecca Roth  60 y.o. 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, LEFT EYE, Corneal wound/laceration dehissence  PROCEDURE:  Procedure(s): CATARACT EXTRACTION AND INTRAOCULAR LENS PLACEMENT (IOC) (Left) RECONSTRUCTION OF CORNEAL WOUND/LACERATION LEFT EYE  SURGEON:  Surgeon(s) and Role:    * Jalene Mullet, MD - Primary  PHYSICIAN ASSISTANT:   ASSISTANTS: none   ANESTHESIA:   local and MAC  EBL:  minimal   BLOOD ADMINISTERED:none  DRAINS: none   LOCAL MEDICATIONS USED:  MARCAINE    and LIDOCAINE   SPECIMEN:  No Specimen  DISPOSITION OF SPECIMEN:  N/A  COUNTS:  YES  TOURNIQUET:  * No tourniquets in log *  DICTATION: .Note written in EPIC  PLAN OF CARE: Discharge to home after PACU  PATIENT DISPOSITION:  PACU - hemodynamically stable.   Delay start of Pharmacological VTE agent (>24hrs) due to surgical blood loss or risk of bleeding: not applicable

## 2021-11-27 ENCOUNTER — Encounter (HOSPITAL_COMMUNITY): Payer: Self-pay | Admitting: Ophthalmology

## 2021-11-29 NOTE — Anesthesia Postprocedure Evaluation (Signed)
Anesthesia Post Note  Patient: Rebecca Roth  Procedure(s) Performed: CATARACT EXTRACTION AND INTRAOCULAR LENS PLACEMENT (IOC) (Left: Eye)     Patient location during evaluation: PACU Anesthesia Type: MAC Level of consciousness: awake and alert Pain management: pain level controlled Vital Signs Assessment: post-procedure vital signs reviewed and stable Respiratory status: spontaneous breathing, nonlabored ventilation, respiratory function stable and patient connected to nasal cannula oxygen Cardiovascular status: stable and blood pressure returned to baseline Postop Assessment: no apparent nausea or vomiting Anesthetic complications: no   No notable events documented.  Last Vitals:  Vitals:   11/25/21 1645 11/25/21 1700  BP: 93/60 111/60  Pulse: 92 92  Resp: 16 20  Temp:  36.8 C  SpO2: 92% 95%    Last Pain:  Vitals:   11/25/21 1700  TempSrc:   PainSc: 0-No pain                 Zadaya Cuadra

## 2021-12-05 ENCOUNTER — Ambulatory Visit (INDEPENDENT_AMBULATORY_CARE_PROVIDER_SITE_OTHER): Payer: 59 | Admitting: Physician Assistant

## 2021-12-05 ENCOUNTER — Encounter: Payer: Self-pay | Admitting: Physician Assistant

## 2021-12-05 ENCOUNTER — Ambulatory Visit (INDEPENDENT_AMBULATORY_CARE_PROVIDER_SITE_OTHER): Payer: 59 | Admitting: Mental Health

## 2021-12-05 ENCOUNTER — Other Ambulatory Visit: Payer: Self-pay

## 2021-12-05 DIAGNOSIS — F411 Generalized anxiety disorder: Secondary | ICD-10-CM

## 2021-12-05 DIAGNOSIS — F422 Mixed obsessional thoughts and acts: Secondary | ICD-10-CM

## 2021-12-05 DIAGNOSIS — F331 Major depressive disorder, recurrent, moderate: Secondary | ICD-10-CM | POA: Diagnosis not present

## 2021-12-05 MED ORDER — LAMOTRIGINE 150 MG PO TABS: 150.0000 mg | ORAL_TABLET | Freq: Two times a day (BID) | ORAL | 1 refills | Status: DC

## 2021-12-05 MED ORDER — CLONAZEPAM 0.5 MG PO TABS: 0.5000 mg | ORAL_TABLET | Freq: Three times a day (TID) | ORAL | 2 refills | Status: DC | PRN

## 2021-12-05 MED ORDER — REXULTI 3 MG PO TABS: 1.0000 | ORAL_TABLET | Freq: Every day | ORAL | 0 refills | Status: DC

## 2021-12-05 MED ORDER — FLUOXETINE HCL 40 MG PO CAPS: ORAL_CAPSULE | ORAL | 11 refills | Status: DC

## 2021-12-05 NOTE — Progress Notes (Signed)
      [facility] Counselor/Therapist Progress Note   Patient ID: Marifer Hurd, MRN: [id number]  Date: 12/05/21  Timespent: 50 minutes  Treatment Type: Individual  Mental Status Exam:    Appearance:    Casual     Behavior:   Appropriate  Motor:   Normal  Speech/Language:    Normal Rate  Affect:   Full Range  Mood:    Euthymic  Thought process:   Coherent and Relevant  Thought content:     Logical  Perceptual disturbances:     Normal  Orientation:   Full (Time, Place, and Person)  Attention:   Good  Concentration:   good  Memory:    Intact  Fund of knowledge:    Good  Insight:     Good  Judgment:    Good  Impulse Control:   good     Reported Symptoms:  anxiety,  deficits with attention and concentration, history of panic attacks   Risk Assessment: Danger to Self:  No Self-injurious Behavior: No Danger to Others: No Duty to Warn:no Physical Aggression / Violence:No  Access to Firearms a concern: No  Gang Involvement:No  Patient / guardian was educated about steps to take if suicide or homicide risk level increases between visits. While future psychiatric events cannot be accurately predicted, the patient does not currently require acute inpatient psychiatric care and does not currently meet [facility] involuntary commitment criteria.  Subjective: Patient shared progress since last visit.  She stated that she is struggling more with her depression over the past month, tearful in today's session.  She had difficulty identifying circumstantial stressors that would lead to this increase and recognizes the need for a potential medication change to be helpful.  She stated she met with Donnal Moat, NP prior to today's session and discussed her medication concerns and adjustments have been made; patient is hopeful.  Facilitated her identifying recent events that may have relevance into some of her mood changes where she shared the ongoing relational strain between her  daughter and son.  Thomas spent to allow her to detail these events, causing factors as well as how it is affecting her in the family in general.  Encouraged patient to recognize what is in her control, how her 2 children will ultimately have to work through this issue himself.  Facilitated her identifying interpersonal needs during this time for her own self-care and mood management.     Interventions:CBT, Solution Focused and Strength-based   Diagnosis:   ICD-10-CM   1. Mixed obsessional thoughts and acts  F42.2        Plan:  1.  Patient to continue to engage in individual counseling 2-4 times a month or as needed. 2.  Patient to identify and apply CBT, coping skills learned in session to decrease depression and anxiety symptoms. 3.  Patient to improve effective communication and assertiveness with others. 4.  Patient to decrease obsessive compulsive behaviors with systematic desensitization as discussed in session. 5.  Patient to contact this office, go to the local ED or call 911 if a crisis or emergency develops between visits.  Anson [place], [facility]

## 2021-12-05 NOTE — Progress Notes (Signed)
[facility] Med Check  Patient ID: Rebecca Roth,  MRN: [id number]  PCP: Jim Like, NP  Date of Evaluation: 12/05/2021 Time spent:40 minutes  Chief Complaint:  Chief Complaint   Anxiety; Depression; Follow-up      HISTORY/CURRENT STATUS: HPI For routine med check.  More depressed, it's been building. Her kids aren't speaking to each other and that bothers her. Thanksgiving was hard. Cries easily. Isolating. Sleeps a lot, takes naps and then goes back to bed. Appetite has increased, eats the wrong things, blood glucoses are out of whack. Works 3 days a week and that's pretty much the only time she goes out. Uses CPAP faithfully.  Saw her provider for that issue a few weeks ago.  No SI/HI.   Anxiety is worse, klonopin helps, has to take it more often. Not having PA but feels generally anxious a lot.   Patient denies increased energy with decreased need for sleep, no increased talkativeness, no racing thoughts, no impulsivity or risky behaviors, no increased spending,  no grandiosity, no increased irritability or anger, and no hallucinations.  Review of Systems  Constitutional:  Positive for malaise/fatigue.  HENT:         Seasonal allergies  Eyes:        Recent cataract surgery.  Respiratory: Negative.    Cardiovascular: Negative.   Gastrointestinal: Negative.   Genitourinary: Negative.   Musculoskeletal: Negative.   Skin: Negative.   Neurological: Negative.   Endo/Heme/Allergies: Negative.   Psychiatric/Behavioral:         See HPI.    Individual Medical History/ Review of Systems: Changes? :Yes   had cataract surgery. Eye is healing.   Past medications for mental health diagnoses include: Cymbalta, Depakote, Effexor, Celexa Wellbutrin, Abilify, Zoloft ? Lexapro?, Prozac, Buspar, Klonopin, Rexulti.  Allergies: Ace inhibitors and Sulfa antibiotics  Current Medications:  Current Outpatient Medications:    albuterol (VENTOLIN HFA) 108 (90 Base) MCG/ACT  inhaler, Inhale 2 puffs into the lungs every 6 (six) hours as needed (Asthma)., Disp: , Rfl:    aspirin EC 81 MG tablet, Take 81 mg by mouth daily. Swallow whole., Disp: , Rfl:    atorvastatin (LIPITOR) 40 MG tablet, Take 1 tablet (40 mg total) by mouth daily. (Patient taking differently: Take 40 mg by mouth at bedtime.), Disp: 30 tablet, Rfl: 2   busPIRone (BUSPAR) 30 MG tablet, TAKE ONE (1) TABLET BY MOUTH TWICE DAILY, Disp: 60 tablet, Rfl: 10   carvedilol (COREG) 6.25 MG tablet, Take 6.25 mg by mouth 2 (two) times daily., Disp: , Rfl:    etodolac (LODINE) 400 MG tablet, TAKE 1 TABLET BY MOUTH TWICE A DAY WITH FOOD (Patient taking differently: Take 200 mg by mouth 2 (two) times daily.), Disp: 60 tablet, Rfl: 2   fenofibrate 54 MG tablet, TAKE 1 TABLET BY MOUTH EVERY DAY, Disp: 30 tablet, Rfl: 2   furosemide (LASIX) 40 MG tablet, Take 40 mg by mouth daily., Disp: , Rfl:    Insulin Pen Needle (SURE COMFORT PEN NEEDLES) 31G X 5 MM MISC, Use as directed, Disp: 30 each, Rfl: 1   Lancet Devices (SIMPLE DIAGNOSTICS LANCING DEV) MISC, , Disp: , Rfl:    LEVEMIR FLEXTOUCH 100 UNIT/ML Pen, Inject 80 Units into the skin 2 (two) times daily. , Disp: , Rfl:    levocetirizine (XYZAL) 5 MG tablet, Take 5 mg by mouth every evening., Disp: , Rfl:    levothyroxine (SYNTHROID) 137 MCG tablet, Take 137 mcg by mouth daily., Disp: , Rfl:  montelukast (SINGULAIR) 10 MG tablet, TAKE 1 TABLET BY MOUTH EVERY DAY (Patient taking differently: Take 10 mg by mouth daily.), Disp: 90 tablet, Rfl: 1   OZEMPIC, 0.25 OR 0.5 MG/DOSE, 2 MG/1.5ML SOPN, Inject 5 mg into the skin once a week., Disp: , Rfl:    pantoprazole (PROTONIX) 40 MG tablet, TAKE 1 TABLET BY MOUTH TWICE A DAY (Patient taking differently: Take 40 mg by mouth 2 (two) times daily.), Disp: 60 tablet, Rfl: 2   SYNJARDY XR 25-1000 MG TB24, TAKE 1 TABLET BY MOUTH EVERY DAY IN THE MORNING (Patient taking differently: Take 1 tablet by mouth daily.), Disp: 30 tablet, Rfl:  2   valsartan (DIOVAN) 80 MG tablet, Take 1 tablet (80 mg total) by mouth daily., Disp: 90 tablet, Rfl: 0   Vitamin D, Ergocalciferol, (DRISDOL) 50000 UNITS CAPS capsule, Take 50,000 Units by mouth every Tuesday. At bedtime, Disp: , Rfl:    BAYER CONTOUR TEST test strip, , Disp: , Rfl:    Brexpiprazole (REXULTI) 3 MG TABS, Take 1 tablet (3 mg total) by mouth daily., Disp: 90 tablet, Rfl: 0   clonazePAM (KLONOPIN) 0.5 MG tablet, Take 1 tablet (0.5 mg total) by mouth 3 (three) times daily as needed. for anxiety, Disp: 30 tablet, Rfl: 2   FLUoxetine (PROZAC) 40 MG capsule, TAKE TWO (2) CAPSULES BY MOUTH ONCE DAILY, Disp: 60 capsule, Rfl: 11   lamoTRIgine (LAMICTAL) 150 MG tablet, Take 1 tablet (150 mg total) by mouth 2 (two) times daily., Disp: 60 tablet, Rfl: 1   propranolol ER (INDERAL LA) 60 MG 24 hr capsule, TAKE 1 CAPSULE BY MOUTH EVERY DAY (Patient not taking: Reported on 12/05/2021), Disp: 30 capsule, Rfl: 1 Medication Side Effects: none  Family Medical/ Social History: Changes?  No  MENTAL HEALTH EXAM:  There were no vitals taken for this visit.There is no height or weight on file to calculate BMI.  General Appearance: Casual, Neat, Well Groomed and Obese  Eye Contact:  Good  Speech:  Clear and Coherent and Normal Rate  Volume:  Normal  Mood:  Anxious and Depressed  Affect:  Depressed, Tearful, and Anxious  Thought Process:  Goal Directed and Descriptions of Associations: Circumstantial  Orientation:  Full (Time, Place, and Person)  Thought Content: Logical   Suicidal Thoughts:  No  Homicidal Thoughts:  No  Memory:  WNL  Judgement:  Good  Insight:  Good  Psychomotor Activity:  Normal  Concentration:  Concentration: Good and Attention Span: Good  Recall:  Good  Fund of Knowledge: Good  Language: Good  Assets:  Desire for Improvement  ADL's:  Intact  Cognition: WNL  Prognosis:  Good   Labs are followed by her PCP.  DIAGNOSES:    ICD-10-CM   1. Major depressive disorder,  recurrent episode, moderate (HCC)  F33.1     2. Mixed obsessional thoughts and acts  F42.2     3. Generalized anxiety disorder  F41.1        Receiving Psychotherapy: Yes   with Elio Forget, [facility].   RECOMMENDATIONS:  PDMP reviewed.  Last Klonopin filled 12/26/2020. I provided 40 minutes of face to face time during this encounter, including time spent before and after the visit in records review, medical decision making, counseling pertinent to today's visit, and charting.  Recommend increasing the Lamictal.  Currently on 250 mg a day total and will go up to 300 mg total. Will see whether she can get on patient assistance for Rexulti for next year as  her insurance will not be covering it. Continue Rexulti 3 mg 1 p.o. daily. Continue BuSpar 30 mg, 1 p.o. twice daily. Continue Klonopin 0.5 mg, 1 p.o. 3 times daily as needed.   Continue Prozac 40 mg, 2 p.o. daily. Increase Lamictal 150 mg to 1 p.o. twice daily.   Continue vitamin D, add multivitamin and B complex.  Fish oil also. Continue CPAP use. Continue therapy with Elio Forget, [facility]. Return in 2 to 3 weeks.   Melony Overly, PA-C

## 2021-12-24 ENCOUNTER — Other Ambulatory Visit: Payer: Self-pay

## 2021-12-24 ENCOUNTER — Encounter: Payer: Self-pay | Admitting: Physician Assistant

## 2021-12-24 ENCOUNTER — Ambulatory Visit (INDEPENDENT_AMBULATORY_CARE_PROVIDER_SITE_OTHER): Payer: 59 | Admitting: Physician Assistant

## 2021-12-24 DIAGNOSIS — F411 Generalized anxiety disorder: Secondary | ICD-10-CM | POA: Diagnosis not present

## 2021-12-24 DIAGNOSIS — F422 Mixed obsessional thoughts and acts: Secondary | ICD-10-CM | POA: Diagnosis not present

## 2021-12-24 DIAGNOSIS — G4733 Obstructive sleep apnea (adult) (pediatric): Secondary | ICD-10-CM

## 2021-12-24 DIAGNOSIS — F3341 Major depressive disorder, recurrent, in partial remission: Secondary | ICD-10-CM

## 2021-12-24 NOTE — Progress Notes (Signed)
[facility] Med Check  Patient ID: Rebecca Roth,  MRN: [id number]  PCP: Jim Like, NP  Date of Evaluation: 12/24/2021 Time spent:20 minutes  Chief Complaint:  Chief Complaint   Anxiety; Depression; Follow-up      HISTORY/CURRENT STATUS: HPI For routine med check.  On December 9th, we increased the Lamictal.  She is much, much better!  Not nearly as anxious or depressed.  She did not get the Klonopin filled because she has not needed it very often.  She does take it more proactively than waiting until the anxiety gets really severe.  She has found that it works better that way.  She still takes it rarely.  She sleeps well, uses her CPAP every night.  She had a great Christmas.  Her adult children were cordial and she cried tears of joy that evening, it was great to see them getting along.  Is able to enjoy things.  Denies decreased energy or motivation.  Appetite has not changed.  No extreme sadness, tearfulness, or feelings of hopelessness.  Denies any changes in concentration, making decisions or remembering things.  Denies suicidal or homicidal thoughts.  Patient denies increased energy with decreased need for sleep, no increased talkativeness, no racing thoughts, no impulsivity or risky behaviors, no increased spending, no increased libido, no grandiosity, no increased irritability or anger, and no hallucinations.  Denies dizziness, syncope, seizures, numbness, tingling, tremor, tics, unsteady gait, slurred speech, confusion. Denies muscle or joint pain, stiffness, or dystonia.  Individual Medical History/ Review of Systems: Changes? :No     Past medications for mental health diagnoses include: Cymbalta, Depakote, Effexor, Celexa Wellbutrin, Abilify, Zoloft ? Lexapro?, Prozac, Buspar, Klonopin, Rexulti.  Allergies: Ace inhibitors and Sulfa antibiotics  Current Medications:  Current Outpatient Medications:    albuterol (VENTOLIN HFA) 108 (90 Base) MCG/ACT  inhaler, Inhale 2 puffs into the lungs every 6 (six) hours as needed (Asthma)., Disp: , Rfl:    aspirin EC 81 MG tablet, Take 81 mg by mouth daily. Swallow whole., Disp: , Rfl:    atorvastatin (LIPITOR) 40 MG tablet, Take 1 tablet (40 mg total) by mouth daily. (Patient taking differently: Take 40 mg by mouth at bedtime.), Disp: 30 tablet, Rfl: 2   Brexpiprazole (REXULTI) 3 MG TABS, Take 1 tablet (3 mg total) by mouth daily., Disp: 90 tablet, Rfl: 0   busPIRone (BUSPAR) 30 MG tablet, TAKE ONE (1) TABLET BY MOUTH TWICE DAILY, Disp: 60 tablet, Rfl: 10   carvedilol (COREG) 6.25 MG tablet, Take 6.25 mg by mouth 2 (two) times daily., Disp: , Rfl:    clonazePAM (KLONOPIN) 0.5 MG tablet, Take 1 tablet (0.5 mg total) by mouth 3 (three) times daily as needed. for anxiety, Disp: 30 tablet, Rfl: 2   etodolac (LODINE) 400 MG tablet, TAKE 1 TABLET BY MOUTH TWICE A DAY WITH FOOD (Patient taking differently: Take 200 mg by mouth 2 (two) times daily.), Disp: 60 tablet, Rfl: 2   fenofibrate 54 MG tablet, TAKE 1 TABLET BY MOUTH EVERY DAY, Disp: 30 tablet, Rfl: 2   FLUoxetine (PROZAC) 40 MG capsule, TAKE TWO (2) CAPSULES BY MOUTH ONCE DAILY, Disp: 60 capsule, Rfl: 11   furosemide (LASIX) 40 MG tablet, Take 40 mg by mouth daily., Disp: , Rfl:    Insulin Pen Needle (SURE COMFORT PEN NEEDLES) 31G X 5 MM MISC, Use as directed, Disp: 30 each, Rfl: 1   lamoTRIgine (LAMICTAL) 150 MG tablet, Take 1 tablet (150 mg total) by mouth 2 (two) times  daily., Disp: 60 tablet, Rfl: 1   Lancet Devices (SIMPLE DIAGNOSTICS LANCING DEV) MISC, , Disp: , Rfl:    LEVEMIR FLEXTOUCH 100 UNIT/ML Pen, Inject 80 Units into the skin 2 (two) times daily. , Disp: , Rfl:    levocetirizine (XYZAL) 5 MG tablet, Take 5 mg by mouth every evening., Disp: , Rfl:    levothyroxine (SYNTHROID) 137 MCG tablet, Take 137 mcg by mouth daily., Disp: , Rfl:    montelukast (SINGULAIR) 10 MG tablet, TAKE 1 TABLET BY MOUTH EVERY DAY (Patient taking differently: Take 10  mg by mouth daily.), Disp: 90 tablet, Rfl: 1   OZEMPIC, 0.25 OR 0.5 MG/DOSE, 2 MG/1.5ML SOPN, Inject 5 mg into the skin once a week., Disp: , Rfl:    pantoprazole (PROTONIX) 40 MG tablet, TAKE 1 TABLET BY MOUTH TWICE A DAY (Patient taking differently: Take 40 mg by mouth 2 (two) times daily.), Disp: 60 tablet, Rfl: 2   SYNJARDY XR 25-1000 MG TB24, TAKE 1 TABLET BY MOUTH EVERY DAY IN THE MORNING (Patient taking differently: Take 1 tablet by mouth daily.), Disp: 30 tablet, Rfl: 2   valsartan (DIOVAN) 80 MG tablet, Take 1 tablet (80 mg total) by mouth daily., Disp: 90 tablet, Rfl: 0   Vitamin D, Ergocalciferol, (DRISDOL) 50000 UNITS CAPS capsule, Take 50,000 Units by mouth every Tuesday. At bedtime, Disp: , Rfl:    BAYER CONTOUR TEST test strip, , Disp: , Rfl:    propranolol ER (INDERAL LA) 60 MG 24 hr capsule, TAKE 1 CAPSULE BY MOUTH EVERY DAY (Patient not taking: Reported on 12/05/2021), Disp: 30 capsule, Rfl: 1 Medication Side Effects: none  Family Medical/ Social History: Changes?  No  MENTAL HEALTH EXAM:  There were no vitals taken for this visit.There is no height or weight on file to calculate BMI.  General Appearance: Casual, Neat, Well Groomed and Obese  Eye Contact:  Good  Speech:  Clear and Coherent and Normal Rate  Volume:  Normal  Mood:  Euthymic  Affect:  Congruent  Thought Process:  Goal Directed and Descriptions of Associations: Circumstantial  Orientation:  Full (Time, Place, and Person)  Thought Content: Logical   Suicidal Thoughts:  No  Homicidal Thoughts:  No  Memory:  WNL  Judgement:  Good  Insight:  Good  Psychomotor Activity:  Normal  Concentration:  Concentration: Good and Attention Span: Good  Recall:  Good  Fund of Knowledge: Good  Language: Good  Assets:  Desire for Improvement  ADL's:  Intact  Cognition: WNL  Prognosis:  Good   Labs are followed by her PCP.  DIAGNOSES:    ICD-10-CM   1. Recurrent major depressive disorder, in partial remission (HCC)   F33.41     2. Generalized anxiety disorder  F41.1     3. Mixed obsessional thoughts and acts  F42.2     4. Obstructive sleep apnea  G47.33       Receiving Psychotherapy: Yes   with Elio Forget, [facility].   RECOMMENDATIONS:  PDMP reviewed.  Klonopin filled 12/26/2020. I provided 20 minutes of face to face time during this encounter, including time spent before and after the visit in records review, medical decision making, counseling pertinent to today's visit, and charting.  I am glad to see her doing so much better! We again discussed the Rexulti.  She will be applying for patient assistance and we hope and pray she gets that, she has done very well on this medication and I do not want to  have to change it. Continue Rexulti 3 mg 1 p.o. daily. Continue BuSpar 30 mg, 1 p.o. twice daily. Continue Klonopin 0.5 mg, 1 p.o. 3 times daily as needed.   Continue Prozac 40 mg, 2 p.o. daily. Continue Lamictal 150 mg, 1 p.o. twice daily.   Continue vitamin D, add multivitamin and B complex.  Fish oil also. Continue CPAP use. Continue therapy with Elio Forget, [facility]. Return in 2 months.  Melony Overly, PA-C

## 2022-01-12 ENCOUNTER — Other Ambulatory Visit: Payer: Self-pay | Admitting: Physician Assistant

## 2022-01-20 ENCOUNTER — Telehealth: Payer: Self-pay

## 2022-01-20 NOTE — Telephone Encounter (Signed)
Prior Authorization submitted and approved for REXULTI 3 MG effective 01/242023-01/20/2023, PA# [id number]

## 2022-02-23 ENCOUNTER — Ambulatory Visit (INDEPENDENT_AMBULATORY_CARE_PROVIDER_SITE_OTHER): Payer: 59 | Admitting: Physician Assistant

## 2022-02-23 ENCOUNTER — Encounter: Payer: Self-pay | Admitting: Physician Assistant

## 2022-02-23 ENCOUNTER — Other Ambulatory Visit: Payer: Self-pay

## 2022-02-23 DIAGNOSIS — F411 Generalized anxiety disorder: Secondary | ICD-10-CM | POA: Diagnosis not present

## 2022-02-23 DIAGNOSIS — R454 Irritability and anger: Secondary | ICD-10-CM | POA: Diagnosis not present

## 2022-02-23 DIAGNOSIS — F3342 Major depressive disorder, recurrent, in full remission: Secondary | ICD-10-CM

## 2022-02-23 DIAGNOSIS — F422 Mixed obsessional thoughts and acts: Secondary | ICD-10-CM | POA: Diagnosis not present

## 2022-02-23 MED ORDER — RISPERIDONE 1 MG PO TABS: ORAL_TABLET | ORAL | 1 refills | Status: DC

## 2022-02-23 NOTE — Progress Notes (Signed)
[facility]  Patient ID: Rebecca Roth,  MRN: [id number]  PCP: Jim Like, NP  Date of Evaluation: 02/23/2022 Time spent:20 minutes  Chief Complaint:  Chief Complaint   Anxiety; Depression; Follow-up     HISTORY/CURRENT STATUS: HPI For routine med check.  Has been off Rexulti for about a month or two.  Even with the prior approval and patient assistance the cost per month would be somewhere around $600 and she would not be able to afford that.  Since being off of it though she is much more and she, gets irritated over nothing.  Has been taking the Klonopin more often.  It does help.  She wonders if there is something else she can take instead.   Patient denies loss of interest in usual activities and is able to enjoy things.  Denies decreased energy or motivation.  Appetite has not changed.  No extreme sadness, tearfulness, or feelings of hopelessness.  Denies any changes in concentration, making decisions or remembering things. Works 16 hours week.  Denies suicidal or homicidal thoughts.  Is needing the Klonopin more right now. Ruminates about her meds and counts a lot, like usual when OCD worsens.  Patient denies increased energy with decreased need for sleep, no increased talkativeness, no racing thoughts, no impulsivity or risky behaviors, no increased spending, no increased libido, no grandiosity, and no hallucinations.  Denies dizziness, syncope, seizures, numbness, tingling, tremor, tics, unsteady gait, slurred speech, confusion. Denies muscle or joint pain, stiffness, or dystonia. PCP follows DM and increased lipids.  Individual Medical History/ Review of Systems: Changes? :No     Past medications for mental health diagnoses include: Cymbalta, Depakote, Effexor, Celexa Wellbutrin, Abilify, Zoloft ? Lexapro?, Prozac, Buspar, Klonopin, Rexulti was effective but too expensive.  Allergies: Ace inhibitors and Sulfa antibiotics  Current Medications:   Current Outpatient Medications:    albuterol (VENTOLIN HFA) 108 (90 Base) MCG/ACT inhaler, Inhale 2 puffs into the lungs every 6 (six) hours as needed (Asthma)., Disp: , Rfl:    aspirin EC 81 MG tablet, Take 81 mg by mouth daily. Swallow whole., Disp: , Rfl:    atorvastatin (LIPITOR) 40 MG tablet, Take 1 tablet (40 mg total) by mouth daily. (Patient taking differently: Take 40 mg by mouth at bedtime.), Disp: 30 tablet, Rfl: 2   BAYER CONTOUR TEST test strip, , Disp: , Rfl:    busPIRone (BUSPAR) 30 MG tablet, TAKE ONE (1) TABLET BY MOUTH TWICE DAILY, Disp: 60 tablet, Rfl: 10   carvedilol (COREG) 6.25 MG tablet, Take 6.25 mg by mouth 2 (two) times daily., Disp: , Rfl:    clonazePAM (KLONOPIN) 0.5 MG tablet, Take 1 tablet (0.5 mg total) by mouth 3 (three) times daily as needed. for anxiety, Disp: 30 tablet, Rfl: 2   etodolac (LODINE) 400 MG tablet, TAKE 1 TABLET BY MOUTH TWICE A DAY WITH FOOD (Patient taking differently: Take 200 mg by mouth 2 (two) times daily.), Disp: 60 tablet, Rfl: 2   fenofibrate 54 MG tablet, TAKE 1 TABLET BY MOUTH EVERY DAY, Disp: 30 tablet, Rfl: 2   FLUoxetine (PROZAC) 40 MG capsule, TAKE TWO (2) CAPSULES BY MOUTH ONCE DAILY, Disp: 60 capsule, Rfl: 11   furosemide (LASIX) 40 MG tablet, Take 40 mg by mouth daily., Disp: , Rfl:    Insulin Pen Needle (SURE COMFORT PEN NEEDLES) 31G X 5 MM MISC, Use as directed, Disp: 30 each, Rfl: 1   lamoTRIgine (LAMICTAL) 150 MG tablet, TAKE 1 TABLET BY MOUTH TWICE  DAILY, Disp: 60 tablet, Rfl: 10   Lancet Devices (SIMPLE DIAGNOSTICS LANCING DEV) MISC, , Disp: , Rfl:    levocetirizine (XYZAL) 5 MG tablet, Take 5 mg by mouth every evening., Disp: , Rfl:    levothyroxine (SYNTHROID) 137 MCG tablet, Take 137 mcg by mouth daily., Disp: , Rfl:    montelukast (SINGULAIR) 10 MG tablet, TAKE 1 TABLET BY MOUTH EVERY DAY (Patient taking differently: Take 10 mg by mouth daily.), Disp: 90 tablet, Rfl: 1   pantoprazole (PROTONIX) 40 MG tablet, TAKE 1 TABLET  BY MOUTH TWICE A DAY (Patient taking differently: Take 40 mg by mouth 2 (two) times daily.), Disp: 60 tablet, Rfl: 2   risperiDONE (RISPERDAL) 1 MG tablet, Take 1/2 pill nightly for 2 weeks, then increase to 1 p.o. nightly., Disp: 30 tablet, Rfl: 1   SYNJARDY XR 25-1000 MG TB24, TAKE 1 TABLET BY MOUTH EVERY DAY IN THE MORNING (Patient taking differently: Take 1 tablet by mouth daily.), Disp: 30 tablet, Rfl: 2   valsartan (DIOVAN) 80 MG tablet, Take 1 tablet (80 mg total) by mouth daily., Disp: 90 tablet, Rfl: 0   Vitamin D, Ergocalciferol, (DRISDOL) 50000 UNITS CAPS capsule, Take 50,000 Units by mouth every Tuesday. At bedtime, Disp: , Rfl:    LEVEMIR FLEXTOUCH 100 UNIT/ML Pen, Inject 80 Units into the skin 2 (two) times daily.  (Patient not taking: Reported on 02/23/2022), Disp: , Rfl:    OZEMPIC, 0.25 OR 0.5 MG/DOSE, 2 MG/1.5ML SOPN, Inject 5 mg into the skin once a week. (Patient not taking: Reported on 02/23/2022), Disp: , Rfl:    propranolol ER (INDERAL LA) 60 MG 24 hr capsule, TAKE 1 CAPSULE BY MOUTH EVERY DAY (Patient not taking: Reported on 12/05/2021), Disp: 30 capsule, Rfl: 1 Medication Side Effects: none  Family Medical/ Social History: Changes?  No  MENTAL HEALTH EXAM:  There were no vitals taken for this visit.There is no height or weight on file to calculate BMI.  General Appearance: Casual, Neat, Well Groomed and Obese  Eye Contact:  Good  Speech:  Clear and Coherent and Normal Rate  Volume:  Normal  Mood:  Euthymic  Affect:  Congruent  Thought Process:  Goal Directed and Descriptions of Associations: Circumstantial  Orientation:  Full (Time, Place, and Person)  Thought Content: Logical   Suicidal Thoughts:  No  Homicidal Thoughts:  No  Memory:  WNL  Judgement:  Good  Insight:  Good  Psychomotor Activity:  Normal  Concentration:  Concentration: Good and Attention Span: Good  Recall:  Good  Fund of Knowledge: Good  Language: Good  Assets:  Desire for  Improvement Financial Resources/Insurance Housing Transportation Vocational/Educational  ADL's:  Intact  Cognition: WNL  Prognosis:  Good   Labs are followed by her PCP.  DIAGNOSES:    ICD-10-CM   1. Recurrent major depressive disorder, in full remission (HCC)  F33.42     2. Irritability and anger  R45.4     3. Generalized anxiety disorder  F41.1     4. Mixed obsessional thoughts and acts  F42.2        Receiving Psychotherapy: Yes   with Elio Forget, [facility].   RECOMMENDATIONS:  PDMP reviewed.  Klonopin filled 12/26/2020. I provided 20 minutes of face to face time during this encounter, including time spent before and after the visit in records review, medical decision making, counseling pertinent to today's visit, and charting.  Discussed options to replace Rexulti. Recommend Risperdal. She understands the potential metabolic side effects  associated with atypical antipsychotics.  Discussed potential risk for movement side effects. Patient understands has been advised to contact office if any movement side effects occur.    Start Risperdal 1 mg, 1/2 qhs for 2 wks, then may increase to 1 po qhs.  Continue BuSpar 30 mg, 1 p.o. twice daily. Continue Klonopin 0.5 mg, 1 p.o. 3 times daily as needed.   Continue Prozac 40 mg, 2 p.o. daily. Continue Lamictal 150 mg, 1 p.o. twice daily.   Continue vitamin D, add multivitamin and B complex.  Fish oil also. Continue CPAP use. Continue therapy with Elio Forget, [facility]. Return in 4-6 wks.  Melony Overly, PA-C

## 2022-03-06 ENCOUNTER — Other Ambulatory Visit: Payer: Self-pay

## 2022-03-06 ENCOUNTER — Ambulatory Visit (INDEPENDENT_AMBULATORY_CARE_PROVIDER_SITE_OTHER): Payer: 59 | Admitting: Mental Health

## 2022-03-06 DIAGNOSIS — F411 Generalized anxiety disorder: Secondary | ICD-10-CM | POA: Diagnosis not present

## 2022-03-06 DIAGNOSIS — F331 Major depressive disorder, recurrent, moderate: Secondary | ICD-10-CM

## 2022-03-06 NOTE — Progress Notes (Signed)
?      [facility] Counselor/Therapist Progress Note ? ? ?Patient ID: Rebecca Roth, MRN: [id number] ? ?Date: 03/06/22 ? ?Timespent: 51 minutes ? ?Treatment Type: Individual ? ?Mental Status Exam: ?   ?Appearance:    Casual     ?Behavior:   Appropriate  ?Motor:   Normal  ?Speech/Language:    Normal Rate  ?Affect:   Full Range  ?Mood:    Euthymic  ?Thought process:   Coherent and Relevant  ?Thought content:     Logical  ?Perceptual disturbances:     Normal  ?Orientation:   Full (Time, Place, and Person)  ?Attention:   Good  ?Concentration:   good  ?Memory:    Intact  ?Fund of knowledge:    Good  ?Insight:     Good  ?Judgment:    Good  ?Impulse Control:   good   ?  ?Reported Symptoms:  anxiety,  deficits with attention and concentration, history of panic attacks ?  ?Risk Assessment: ?Danger to Self:  No ?Self-injurious Behavior: No ?Danger to Others: No ?Duty to Warn:no ?Physical Aggression / Violence:No  ?Access to Firearms a concern: No  ?Gang Involvement:No  ?Patient / guardian was educated about steps to take if suicide or homicide risk level increases between visits. ?While future psychiatric events cannot be accurately predicted, the patient does not currently require acute inpatient psychiatric care and does not currently meet [facility] involuntary commitment criteria. ? ?Subjective: ?Patient shared progress since last visit.  Patient shared progress, how she has been able to have more mood stability after a medication change as she continues to see Melony Overly, NP.  She stated that she feels less "on edge", less irritability, improved distress tolerance.  She reports that her 2 adult children have had some improvements in their relationship, how her daughter has allowed her 96-year-old son to spend time with her brother, how this is significant given their strained relational history.  She stated her daughter is now engaged, that their relationship has been in a good place.  She continues to work  part-time, about 18 hours/week.  Reports decreased financial stress, in part due to her being resourceful and utilizing other supplemental insurance plans to assist in managing some of her other medications related to her diabetes.  Making attempts to continue to engage in pleasurable activities, continuing to reach out to her family supports as well as 2 of her closer friends as needed. ? ? ? ? ?Interventions:CBT, Solution Focused and Strength-based ?  ?Diagnosis: ?  ICD-10-CM   ?1. Generalized anxiety disorder  F41.1   ?  ?2. Major depressive disorder, recurrent episode, moderate (HCC)  F33.1   ?  ? ? ? ? ?Plan:  ?1.  Patient to continue to engage in individual counseling 2-4 times a month or as needed. ?2.  Patient to identify and apply CBT, coping skills learned in session to decrease depression and anxiety symptoms. ?3.  Patient to improve effective communication and assertiveness with others. ?4.  Patient to decrease obsessive compulsive behaviors with systematic desensitization as discussed in session. ?5.  Patient to contact this office, go to the local ED or call 911 if a crisis or emergency develops between visits. ? ?Waldron Session, [facility] ? ? ? ? ? ? ?

## 2022-03-23 ENCOUNTER — Encounter: Payer: Self-pay | Admitting: Physician Assistant

## 2022-03-23 ENCOUNTER — Other Ambulatory Visit: Payer: Self-pay

## 2022-03-23 ENCOUNTER — Ambulatory Visit (INDEPENDENT_AMBULATORY_CARE_PROVIDER_SITE_OTHER): Payer: 59 | Admitting: Physician Assistant

## 2022-03-23 VITALS — BP 137/76 | HR 80

## 2022-03-23 DIAGNOSIS — F422 Mixed obsessional thoughts and acts: Secondary | ICD-10-CM

## 2022-03-23 DIAGNOSIS — F411 Generalized anxiety disorder: Secondary | ICD-10-CM

## 2022-03-23 DIAGNOSIS — G4733 Obstructive sleep apnea (adult) (pediatric): Secondary | ICD-10-CM

## 2022-03-23 DIAGNOSIS — F3342 Major depressive disorder, recurrent, in full remission: Secondary | ICD-10-CM | POA: Diagnosis not present

## 2022-03-23 DIAGNOSIS — R251 Tremor, unspecified: Secondary | ICD-10-CM

## 2022-03-23 MED ORDER — RISPERIDONE 1 MG PO TABS: 1.0000 mg | ORAL_TABLET | Freq: Every day | ORAL | 1 refills | Status: DC

## 2022-03-23 MED ORDER — PROPRANOLOL HCL 20 MG PO TABS: 20.0000 mg | ORAL_TABLET | Freq: Two times a day (BID) | ORAL | 2 refills | Status: DC

## 2022-03-23 NOTE — Progress Notes (Signed)
[facility] ? ?Patient ID: Rebecca Roth,  ?MRN: [id number] ? ?PCP: Jim LikePotts, Anna S, NP ? ?Date of Evaluation: 03/23/2022 ?Time spent:30 minutes ? ?Chief Complaint:  ?Chief Complaint   ?Anxiety; Depression; Follow-up ?  ? ? ?HISTORY/CURRENT STATUS: ?HPI For routine med check. ? ?A month ago we started Risperdal.  She had been off Rexulti for a couple of months because her insurance did not cover it.  Now that she has been on the Risperdal her mood has leveled out nicely, and the anxiety has improved.  She is not having any irritability or anger to speak of.  Denies increased energy with decreased need for sleep.  No impulsivity, grandiosity, or increased spending.  No paranoia, no hallucinations. ? ?Patient denies loss of interest in usual activities and is able to enjoy things.  Denies decreased energy or motivation.  Appetite has not changed.  No extreme sadness, tearfulness, or feelings of hopelessness.  Denies any changes in concentration, making decisions or remembering things. Works 16 hours week.  Denies suicidal or homicidal thoughts. ? ?Since starting the Risperdal, the anxiety has improved as well.  Not needing the Klonopin as much but does take it when needed and it is still effective.  Does still obsess about things at times but not nearly as much as she has in the past. ? ?Has been off propranolol for 3 or 4 months now because insurance would not pay for it.  She is having a lot of tremor in both hands, has difficulty writing and eating.  Food will fall off the fork or spoon.  "It is very embarrassing."  Denies dizziness, syncope, seizures, numbness, tingling, tics, unsteady gait, slurred speech, confusion. Denies muscle or joint pain, stiffness, or dystonia. PCP follows DM and increased lipids. ? ?Individual Medical History/ Review of Systems: Changes? :No    ? ?Past medications for mental health diagnoses include: ?Cymbalta, Depakote, Effexor, Celexa Wellbutrin, Abilify, Zoloft ?  Lexapro?, Prozac, Buspar, Klonopin, Rexulti was effective but too expensive. ? ?Allergies: Ace inhibitors and Sulfa antibiotics ? ?Current Medications:  ?Current Outpatient Medications:  ?  albuterol (VENTOLIN HFA) 108 (90 Base) MCG/ACT inhaler, Inhale 2 puffs into the lungs every 6 (six) hours as needed (Asthma)., Disp: , Rfl:  ?  aspirin EC 81 MG tablet, Take 81 mg by mouth daily. Swallow whole., Disp: , Rfl:  ?  atorvastatin (LIPITOR) 40 MG tablet, Take 1 tablet (40 mg total) by mouth daily. (Patient taking differently: Take 40 mg by mouth at bedtime.), Disp: 30 tablet, Rfl: 2 ?  BAYER CONTOUR TEST test strip, , Disp: , Rfl:  ?  busPIRone (BUSPAR) 30 MG tablet, TAKE ONE (1) TABLET BY MOUTH TWICE DAILY, Disp: 60 tablet, Rfl: 10 ?  carvedilol (COREG) 6.25 MG tablet, Take 6.25 mg by mouth 2 (two) times daily., Disp: , Rfl:  ?  clonazePAM (KLONOPIN) 0.5 MG tablet, Take 1 tablet (0.5 mg total) by mouth 3 (three) times daily as needed. for anxiety, Disp: 30 tablet, Rfl: 2 ?  etodolac (LODINE) 400 MG tablet, TAKE 1 TABLET BY MOUTH TWICE A DAY WITH FOOD (Patient taking differently: Take 200 mg by mouth 2 (two) times daily.), Disp: 60 tablet, Rfl: 2 ?  fenofibrate 54 MG tablet, TAKE 1 TABLET BY MOUTH EVERY DAY, Disp: 30 tablet, Rfl: 2 ?  FLUoxetine (PROZAC) 40 MG capsule, TAKE TWO (2) CAPSULES BY MOUTH ONCE DAILY, Disp: 60 capsule, Rfl: 11 ?  furosemide (LASIX) 40 MG tablet, Take 40 mg by mouth  daily., Disp: , Rfl:  ?  Insulin Pen Needle (SURE COMFORT PEN NEEDLES) 31G X 5 MM MISC, Use as directed, Disp: 30 each, Rfl: 1 ?  lamoTRIgine (LAMICTAL) 150 MG tablet, TAKE 1 TABLET BY MOUTH TWICE DAILY, Disp: 60 tablet, Rfl: 10 ?  Lancet Devices (SIMPLE DIAGNOSTICS LANCING DEV) MISC, , Disp: , Rfl:  ?  levocetirizine (XYZAL) 5 MG tablet, Take 5 mg by mouth every evening., Disp: , Rfl:  ?  levothyroxine (SYNTHROID) 137 MCG tablet, Take 137 mcg by mouth daily., Disp: , Rfl:  ?  montelukast (SINGULAIR) 10 MG tablet, TAKE 1 TABLET BY  MOUTH EVERY DAY (Patient taking differently: Take 10 mg by mouth daily.), Disp: 90 tablet, Rfl: 1 ?  propranolol (INDERAL) 20 MG tablet, Take 1 tablet (20 mg total) by mouth 2 (two) times daily., Disp: 60 tablet, Rfl: 2 ?  valsartan (DIOVAN) 80 MG tablet, Take 1 tablet (80 mg total) by mouth daily., Disp: 90 tablet, Rfl: 0 ?  Vitamin D, Ergocalciferol, (DRISDOL) 50000 UNITS CAPS capsule, Take 50,000 Units by mouth every Tuesday. At bedtime, Disp: , Rfl:  ?  LEVEMIR FLEXTOUCH 100 UNIT/ML Pen, Inject 80 Units into the skin 2 (two) times daily.  (Patient not taking: Reported on 02/23/2022), Disp: , Rfl:  ?  OZEMPIC, 0.25 OR 0.5 MG/DOSE, 2 MG/1.5ML SOPN, Inject 5 mg into the skin once a week. (Patient not taking: Reported on 02/23/2022), Disp: , Rfl:  ?  pantoprazole (PROTONIX) 40 MG tablet, TAKE 1 TABLET BY MOUTH TWICE A DAY (Patient taking differently: Take 40 mg by mouth 2 (two) times daily.), Disp: 60 tablet, Rfl: 2 ?  risperiDONE (RISPERDAL) 1 MG tablet, Take 1 tablet (1 mg total) by mouth at bedtime., Disp: 90 tablet, Rfl: 1 ?  SYNJARDY XR 25-1000 MG TB24, TAKE 1 TABLET BY MOUTH EVERY DAY IN THE MORNING (Patient taking differently: Take 1 tablet by mouth daily.), Disp: 30 tablet, Rfl: 2 ?Medication Side Effects: none ? ?Family Medical/ Social History: Changes?  No ? ?MENTAL HEALTH EXAM: ? ?Blood pressure 137/76, pulse 80.There is no height or weight on file to calculate BMI.  ?General Appearance: Casual, Neat, Well Groomed and Obese  ?Eye Contact:  Good  ?Speech:  Clear and Coherent and Normal Rate  ?Volume:  Normal  ?Mood:  Euthymic  ?Affect:  Congruent  ?Thought Process:  Goal Directed and Descriptions of Associations: Circumstantial  ?Orientation:  Full (Time, Place, and Person)  ?Thought Content: Logical   ?Suicidal Thoughts:  No  ?Homicidal Thoughts:  No  ?Memory:  WNL  ?Judgement:  Good  ?Insight:  Good  ?Psychomotor Activity:  Normal  ?Concentration:  Concentration: Good and Attention Span: Good  ?Recall:   Good  ?Fund of Knowledge: Good  ?Language: Good  ?Assets:  Desire for Improvement ?Housing ?Transportation ?Vocational/Educational  ?ADL's:  Intact  ?Cognition: WNL  ?Prognosis:  Good  ? ?Labs are followed by her PCP. ? ?DIAGNOSES:  ?  ICD-10-CM   ?1. Recurrent major depressive disorder, in full remission (HCC)  F33.42   ?  ?2. Generalized anxiety disorder  F41.1   ?  ?3. Mixed obsessional thoughts and acts  F42.2   ?  ?4. Obstructive sleep apnea  G47.33   ?  ?5. Tremor of both outstretched hands  R25.1   ?  ? ? ? ?Receiving Psychotherapy: Yes   with Elio Forget, [facility]. ? ? ?RECOMMENDATIONS:  ?PDMP reviewed.  Klonopin filled 12/26/2020. ?I provided 30 minutes of face to face time  during this encounter, including time spent before and after the visit in records review, medical decision making, counseling pertinent to today's visit, and charting.  ?While Arline Asp was here, my computer was down so I could not review her medications.  I recommended she use [facility] in order to get the propranolol.  She had been on the extended release but because she is on another BP med I had prefer to have her take short acting so that is what I will send in today.  I tried to contact her but her voicemail is not set up on her phone and there was no answer. ? ?Start propranolol 20 mg, 1 p.o. twice daily. ?Continue Risperdal 1 mg, 1 po qhs.  ?Continue BuSpar 30 mg, 1 p.o. twice daily. ?Continue Klonopin 0.5 mg, 1 p.o. 3 times daily as needed.   ?Continue Prozac 40 mg, 2 p.o. daily. ?Continue Lamictal 150 mg, 1 p.o. twice daily.   ?Continue vitamin D, add multivitamin and B complex.  Fish oil also. ?Continue CPAP use. ?Continue therapy with Elio Forget, [facility]. ?Return in 3 months. ? ?Melony Overly, PA-C  ?

## 2022-04-13 ENCOUNTER — Other Ambulatory Visit: Payer: Self-pay | Admitting: Physician Assistant

## 2022-04-14 NOTE — Telephone Encounter (Signed)
Call to verify pharmacy ?

## 2022-04-20 ENCOUNTER — Other Ambulatory Visit: Payer: Self-pay | Admitting: Physician Assistant

## 2022-04-26 ENCOUNTER — Other Ambulatory Visit: Payer: Self-pay | Admitting: Physician Assistant

## 2022-04-29 ENCOUNTER — Other Ambulatory Visit: Payer: Self-pay | Admitting: Physician Assistant

## 2022-05-12 ENCOUNTER — Other Ambulatory Visit: Payer: Self-pay | Admitting: Physician Assistant

## 2022-05-14 ENCOUNTER — Other Ambulatory Visit: Payer: Self-pay | Admitting: Physician Assistant

## 2022-05-20 ENCOUNTER — Other Ambulatory Visit: Payer: Self-pay | Admitting: Physician Assistant

## 2022-05-26 ENCOUNTER — Other Ambulatory Visit: Payer: Self-pay | Admitting: Physician Assistant

## 2022-06-01 ENCOUNTER — Other Ambulatory Visit: Payer: Self-pay | Admitting: Physician Assistant

## 2022-06-03 ENCOUNTER — Other Ambulatory Visit: Payer: Self-pay | Admitting: Physician Assistant

## 2022-06-20 ENCOUNTER — Other Ambulatory Visit: Payer: Self-pay | Admitting: Physician Assistant

## 2022-07-09 ENCOUNTER — Other Ambulatory Visit: Payer: Self-pay | Admitting: Physician Assistant

## 2022-07-10 ENCOUNTER — Encounter: Payer: Self-pay | Admitting: Physician Assistant

## 2022-07-10 ENCOUNTER — Ambulatory Visit (INDEPENDENT_AMBULATORY_CARE_PROVIDER_SITE_OTHER): Payer: Commercial Managed Care - HMO | Admitting: Mental Health

## 2022-07-10 ENCOUNTER — Ambulatory Visit (INDEPENDENT_AMBULATORY_CARE_PROVIDER_SITE_OTHER): Payer: Commercial Managed Care - HMO | Admitting: Physician Assistant

## 2022-07-10 ENCOUNTER — Telehealth: Payer: Self-pay | Admitting: Physician Assistant

## 2022-07-10 DIAGNOSIS — F411 Generalized anxiety disorder: Secondary | ICD-10-CM | POA: Diagnosis not present

## 2022-07-10 DIAGNOSIS — R251 Tremor, unspecified: Secondary | ICD-10-CM

## 2022-07-10 DIAGNOSIS — F3342 Major depressive disorder, recurrent, in full remission: Secondary | ICD-10-CM

## 2022-07-10 DIAGNOSIS — F422 Mixed obsessional thoughts and acts: Secondary | ICD-10-CM | POA: Diagnosis not present

## 2022-07-10 DIAGNOSIS — G4733 Obstructive sleep apnea (adult) (pediatric): Secondary | ICD-10-CM

## 2022-07-10 MED ORDER — TRIHEXYPHENIDYL HCL 2 MG PO TABS: 2.0000 mg | ORAL_TABLET | Freq: Two times a day (BID) | ORAL | 1 refills | Status: DC

## 2022-07-10 MED ORDER — TRIHEXYPHENIDYL HCL 2 MG PO TABS: ORAL_TABLET | ORAL | 1 refills | Status: DC

## 2022-07-10 NOTE — Patient Instructions (Signed)
Wean off Propranolol by taking 1 pill daily for 4 days, then stop.

## 2022-07-10 NOTE — Progress Notes (Signed)
      [facility] Counselor/Therapist Progress Note   Patient ID: Rebecca Roth, MRN: [id number]  Date: 07/10/22  Timespent: 50 minutes  Treatment Type: Individual  Mental Status Exam:    Appearance:    Casual     Behavior:   Appropriate  Motor:   Normal  Speech/Language:    Normal Rate  Affect:   Full Range  Mood:   Euthymic  Thought process:   Coherent and Relevant  Thought content:     Logical  Perceptual disturbances:     Normal  Orientation:   Full (Time, Place, and Person)  Attention:   Good  Concentration:   good  Memory:    Intact  Fund of knowledge:    Good  Insight:     Good  Judgment:    Good  Impulse Control:   good     Reported Symptoms:  anxiety,  deficits with attention and concentration, history of panic attacks   Risk Assessment: Danger to Self:  No Self-injurious Behavior: No Danger to Others: No Duty to Warn:no Physical Aggression / Violence:No  Access to Firearms a concern: No  Gang Involvement:No  Patient / guardian was educated about steps to take if suicide or homicide risk level increases between visits. While future psychiatric events cannot be accurately predicted, the patient does not currently require acute inpatient psychiatric care and does not currently meet [facility] involuntary commitment criteria.  Subjective: Patient shared progress since last visit.  She shared how her husband has had more medical issues which is now limited his mobility.  She stated that her son built a ramp on the back of their house as to meet the needs of her husband.  She shared the ongoing relational strain between her son and daughter going on to share examples, details as well as thoughts and feelings she has related.  Facilitated patient identifying ways to cope, specifically thoughts to focus on and anchor herself with when becoming emotionally distressed due to the chronic challenges her children who have had with their relationship.  Patient was  able to identify thoughts to remind herself of to allow her to have an interpersonal boundary for herself as her primary focus currently is being a support to her husband during this time.  She is also working on self-care by focusing on her diet.         Interventions:CBT, Solution Focused and Strength-based   Diagnosis:   ICD-10-CM   1. Recurrent major depressive disorder, in full remission (HCC)  F33.42          Plan:  1.  Patient to continue to engage in individual counseling 2-4 times a month or as needed. 2.  Patient to identify and apply CBT, coping skills learned in session to decrease depression and anxiety symptoms. 3.  Patient to improve effective communication and assertiveness with others. 4.  Patient to decrease obsessive compulsive behaviors with systematic desensitization as discussed in session. 5.  Patient to contact this office, go to the local ED or call 911 if a crisis or emergency develops between visits.  Waldron Session, [facility]

## 2022-07-10 NOTE — Telephone Encounter (Signed)
Let pharmacy know I stopped propranolol. Thanks.

## 2022-07-10 NOTE — Telephone Encounter (Signed)
[facility] on [address] In [place] called today to see if you had receive their notice asking you to consider revising her medication regimen.  They say she is taking 2 beta blockers and should only be taking 1 and asked in the notice if you would replace one of them with an alternate medication.  I don't know if you got this notice or not, but they want to know what your recommendation is.  You see Rebecca Roth today at 11:00.  Please let the pharmacy what you recommend for her medication. [phone].

## 2022-07-10 NOTE — Progress Notes (Signed)
[facility]  Patient ID: Rebecca Roth,  MRN: [id number]  PCP: Jim Like, NP  Date of Evaluation: 07/10/2022 Time spent:30 minutes  Chief Complaint:  Chief Complaint   Anxiety; Depression; Follow-up     HISTORY/CURRENT STATUS: HPI For routine med check.  Husband had surgery since our last visit.  Then complications, was not able to walk due to what sounds like spinal stenosis.  He is now walking on a walker.  It was very scary there for several weeks.  She had to take the Klonopin more often.  Even with that huge stressor she only took maybe 3 during that whole month.  She does not feel the generalized anxiety near as bad as she used to and rarely has panic attacks now.  She is not obsessing like she used to.  Does not have the compulsions either such as counting and if she does not she cannot move on to the next thing.  Patient denies loss of interest in usual activities and is able to enjoy things.  Denies decreased energy.  Denies decreased motivation.  ADLs and personal hygiene are normal.  Appetite has not changed.  Weight is stable. No extreme sadness, tearfulness, or feelings of hopelessness.  Sleeps well most of the time.  Denies any changes in concentration, making decisions or remembering things.  Continues to work part-time.  Denies suicidal or homicidal thoughts.  No increased energy with decreased need for sleep, no increased talkativeness, no racing thoughts, no impulsivity or risky behaviors, no increased spending, no increased libido, no grandiosity, no increased irritability or anger, no paranoia, and no hallucinations.  Her main complaint right now is tremor.  We restarted propranolol at the last visit but that has not been beneficial at all.  She crosses her arms a lot so she can hide her hands.  She has a hard time eating because food will fall off of the fork or out of the spoon. Denies dizziness, syncope, seizures, numbness, tingling, tics,  unsteady gait, slurred speech, confusion. Denies muscle or joint pain, stiffness, or dystonia. PCP follows DM and increased lipids.  Individual Medical History/ Review of Systems: Changes? :No     Past medications for mental health diagnoses include: Cymbalta, Depakote, Effexor, Celexa Wellbutrin, Abilify, Zoloft ? Lexapro?, Prozac, Buspar, Klonopin, Rexulti was effective but too expensive.  Allergies: Ace inhibitors and Sulfa antibiotics  Current Medications:  Current Outpatient Medications:    albuterol (VENTOLIN HFA) 108 (90 Base) MCG/ACT inhaler, Inhale 2 puffs into the lungs every 6 (six) hours as needed (Asthma)., Disp: , Rfl:    aspirin EC 81 MG tablet, Take 81 mg by mouth daily. Swallow whole., Disp: , Rfl:    atorvastatin (LIPITOR) 40 MG tablet, Take 1 tablet (40 mg total) by mouth daily. (Patient taking differently: Take 40 mg by mouth at bedtime.), Disp: 30 tablet, Rfl: 2   BAYER CONTOUR TEST test strip, , Disp: , Rfl:    busPIRone (BUSPAR) 30 MG tablet, TAKE 1 TABLET BY MOUTH TWICE DAILY, Disp: 60 tablet, Rfl: 10   carvedilol (COREG) 6.25 MG tablet, Take 6.25 mg by mouth 2 (two) times daily., Disp: , Rfl:    clonazePAM (KLONOPIN) 0.5 MG tablet, Take 1 tablet (0.5 mg total) by mouth 3 (three) times daily as needed. for anxiety, Disp: 30 tablet, Rfl: 2   etodolac (LODINE) 400 MG tablet, TAKE 1 TABLET BY MOUTH TWICE A DAY WITH FOOD (Patient taking differently: Take 200 mg by mouth 2 (two) times  daily.), Disp: 60 tablet, Rfl: 2   fenofibrate 54 MG tablet, TAKE 1 TABLET BY MOUTH EVERY DAY, Disp: 30 tablet, Rfl: 2   FLUoxetine (PROZAC) 40 MG capsule, TAKE TWO (2) CAPSULES BY MOUTH ONCE DAILY, Disp: 60 capsule, Rfl: 11   furosemide (LASIX) 40 MG tablet, Take 40 mg by mouth daily., Disp: , Rfl:    insulin glargine, 2 Unit Dial, (TOUJEO MAX SOLOSTAR) 300 UNIT/ML Solostar Pen, Inject 80 Units into the skin 2 (two) times daily., Disp: , Rfl:    Insulin Pen Needle (SURE COMFORT PEN NEEDLES)  31G X 5 MM MISC, Use as directed, Disp: 30 each, Rfl: 1   lamoTRIgine (LAMICTAL) 150 MG tablet, TAKE 1 TABLET BY MOUTH TWICE DAILY, Disp: 60 tablet, Rfl: 10   Lancet Devices (SIMPLE DIAGNOSTICS LANCING DEV) MISC, , Disp: , Rfl:    levocetirizine (XYZAL) 5 MG tablet, Take 5 mg by mouth every evening., Disp: , Rfl:    levothyroxine (SYNTHROID) 137 MCG tablet, Take 137 mcg by mouth daily., Disp: , Rfl:    montelukast (SINGULAIR) 10 MG tablet, TAKE 1 TABLET BY MOUTH EVERY DAY (Patient taking differently: Take 10 mg by mouth daily.), Disp: 90 tablet, Rfl: 1   pantoprazole (PROTONIX) 40 MG tablet, TAKE 1 TABLET BY MOUTH TWICE A DAY (Patient taking differently: Take 40 mg by mouth 2 (two) times daily.), Disp: 60 tablet, Rfl: 2   risperiDONE (RISPERDAL) 1 MG tablet, Take 1 tablet (1 mg total) by mouth at bedtime., Disp: 90 tablet, Rfl: 1   trihexyphenidyl (ARTANE) 2 MG tablet, 1/2 po  qhs 4 nights, then 1/2 po bid., Disp: 30 tablet, Rfl: 1   trihexyphenidyl (ARTANE) 2 MG tablet, Take 1 tablet (2 mg total) by mouth 2 (two) times daily with a meal., Disp: 180 tablet, Rfl: 1   valsartan (DIOVAN) 80 MG tablet, Take 1 tablet (80 mg total) by mouth daily., Disp: 90 tablet, Rfl: 0   Vitamin D, Ergocalciferol, (DRISDOL) 50000 UNITS CAPS capsule, Take 50,000 Units by mouth every Tuesday. At bedtime, Disp: , Rfl:    LEVEMIR FLEXTOUCH 100 UNIT/ML Pen, Inject 80 Units into the skin 2 (two) times daily.  (Patient not taking: Reported on 02/23/2022), Disp: , Rfl:    OZEMPIC, 0.25 OR 0.5 MG/DOSE, 2 MG/1.5ML SOPN, Inject 5 mg into the skin once a week. (Patient not taking: Reported on 02/23/2022), Disp: , Rfl:    SYNJARDY XR 25-1000 MG TB24, TAKE 1 TABLET BY MOUTH EVERY DAY IN THE MORNING (Patient not taking: Reported on 07/10/2022), Disp: 30 tablet, Rfl: 2 Medication Side Effects: none  Family Medical/ Social History: Changes?  Husband had emergency surgery (unable to urinate due to penile blockage) then a few days later he  couldn't walk. Had spinal stenosis, is able to walk now on a walker.   MENTAL HEALTH EXAM:  There were no vitals taken for this visit.There is no height or weight on file to calculate BMI.  General Appearance: Casual, Neat, Well Groomed and Obese  Eye Contact:  Good  Speech:  Clear and Coherent and Normal Rate  Volume:  Normal  Mood:  Euthymic  Affect:  Congruent  Thought Process:  Goal Directed and Descriptions of Associations: Circumstantial  Orientation:  Full (Time, Place, and Person)  Thought Content: Logical   Suicidal Thoughts:  No  Homicidal Thoughts:  No  Memory:  WNL  Judgement:  Good  Insight:  Good  Psychomotor Activity:  Normal  Concentration:  Concentration: Good and Attention Span:  Good  Recall:  Good  Fund of Knowledge: Good  Language: Good  Assets:  Desire for Improvement  ADL's:  Intact  Cognition: WNL  Prognosis:  Good   Labs are followed by her PCP  DIAGNOSES:    ICD-10-CM   1. Mixed obsessional thoughts and acts  F42.2     2. Tremor of both outstretched hands  R25.1     3. Recurrent major depressive disorder, in full remission (HCC)  F33.42     4. Generalized anxiety disorder  F41.1     5. Obstructive sleep apnea  G47.33      Receiving Psychotherapy: Yes   with Rebecca Roth, [facility].   RECOMMENDATIONS:  PDMP reviewed.  Klonopin filled 12/26/2020. I provided 20 minutes of face to face time during this encounter, including time spent before and after the visit in records review, medical decision making, counseling pertinent to today's visit, and charting.   I am sorry to hear about her husband's illness but I am glad he is improving. We discussed the tremor.  I recommend weaning off the propranolol as it is not effective and she is on another beta blocker as well.  Discussed using Artane or Cogentin.  Benefits, risks and side effects were discussed and she accepts.  She would like to try 1 or the other.  Wean off propranolol 20 mg by taking 1  p.o. daily for 4 days and then stop. Continue BuSpar 30 mg, 1 p.o. twice daily. Continue Klonopin 0.5 mg, 1 p.o. 3 times daily as needed.   Continue Prozac 40 mg, 2 p.o. daily. Continue Lamictal 150 mg, 1 p.o. twice daily.   Continue Risperdal 1 mg, 1 po qhs.  Start Artane 2 mg, one half p.o. nightly for 4 nights and then one half p.o. twice daily.  (I went ahead and sent in a 73-month supply to her mail order pharmacy with directions of 1 p.o. twice daily.  If she tolerates this well and needs to increase, that is fine but she knows to stay at one half p.o. twice daily if she does not need to go up.) Continue vitamin D, add multivitamin and B complex.  Fish oil also. Continue CPAP use. Continue therapy with Rebecca Roth, [facility]. Return in 3 months.  Melony Overly, PA-C

## 2022-07-10 NOTE — Telephone Encounter (Signed)
Pharmacist notified.

## 2022-07-10 NOTE — Telephone Encounter (Signed)
You are seeing patient today. Please see the phone message.

## 2022-07-14 ENCOUNTER — Telehealth: Payer: Self-pay | Admitting: Physician Assistant

## 2022-07-14 NOTE — Telephone Encounter (Signed)
Note from 7/14 does not mention seroquel please advise

## 2022-07-14 NOTE — Telephone Encounter (Signed)
Pt lvm asking for a refill on her seroquel 25 mg.. She is not sure if she told teresa to fill it at last visit or not. Please call pt and let her know if she is still suppose to take this medicine. [phone]

## 2022-07-14 NOTE — Telephone Encounter (Signed)
We stopped it last year, didn't need it any longer.

## 2022-07-15 NOTE — Telephone Encounter (Signed)
Pt informed

## 2022-07-17 ENCOUNTER — Other Ambulatory Visit: Payer: Self-pay | Admitting: Physician Assistant

## 2022-07-24 ENCOUNTER — Telehealth: Payer: Self-pay | Admitting: Physician Assistant

## 2022-07-24 NOTE — Telephone Encounter (Signed)
Please see note from pharmacy and advise. We also received a fax in regards to this. I put it in your box outside your door.  

## 2022-07-24 NOTE — Telephone Encounter (Signed)
Please see note from pharmacy and advise. We also received a fax in regards to this. I put it in your box outside your door.

## 2022-07-24 NOTE — Telephone Encounter (Signed)
She was weaned off the propranolol at the visit 07/10/2022 so this is not an issue.  I will document that on the paperwork to be faxed back to [facility] and leave it in the front office box.  Thank you

## 2022-07-24 NOTE — Telephone Encounter (Signed)
Someone from [facility] LVM @ 10:11a.  They did a review of her medicines and because the pt has asthma, they are concerned that she is taking Propranolol and Carvedilol together.  They are suggesting she be taken off and put on something else.  Pls call the pharmacy back at [phone].  Next appt 10/27

## 2022-09-14 ENCOUNTER — Other Ambulatory Visit: Payer: Self-pay | Admitting: Physician Assistant

## 2022-09-25 ENCOUNTER — Other Ambulatory Visit: Payer: Self-pay | Admitting: Physician Assistant

## 2022-09-30 ENCOUNTER — Other Ambulatory Visit: Payer: Self-pay

## 2022-09-30 ENCOUNTER — Telehealth: Payer: Self-pay | Admitting: Physician Assistant

## 2022-09-30 MED ORDER — CLONAZEPAM 0.5 MG PO TABS: 0.5000 mg | ORAL_TABLET | Freq: Three times a day (TID) | ORAL | 0 refills | Status: DC | PRN

## 2022-09-30 NOTE — Telephone Encounter (Signed)
Pended.

## 2022-09-30 NOTE — Telephone Encounter (Signed)
Pt called and asked for a refill on her klonopin. Pharmacy is [facility] on [address] in [place]

## 2022-10-05 ENCOUNTER — Encounter: Payer: Self-pay | Admitting: *Deleted

## 2022-10-05 NOTE — Addendum Note (Signed)
Addended by: Brandon Melnick on: 10/05/2022 09:45 AM   Modules accepted: Orders

## 2022-10-06 ENCOUNTER — Encounter: Payer: Self-pay | Admitting: Neurology

## 2022-10-06 ENCOUNTER — Ambulatory Visit: Payer: Commercial Managed Care - HMO | Admitting: Neurology

## 2022-10-06 VITALS — BP 105/54 | HR 70 | Ht 66.0 in | Wt 257.0 lb

## 2022-10-06 DIAGNOSIS — G2119 Other drug induced secondary parkinsonism: Secondary | ICD-10-CM

## 2022-10-06 DIAGNOSIS — R251 Tremor, unspecified: Secondary | ICD-10-CM | POA: Diagnosis not present

## 2022-10-06 NOTE — Patient Instructions (Signed)
It was nice to meet you both today.  Your tremors may at least be in part secondary to medication such as Risperdal and Prozac. I would not recommend any new medication for tremor control at this time as you are already on multiple medications.  Tremor medication such as Mysoline can affect balance and make you sleepy and increase her fall risk.  You have already fallen several times.  Please use your walker at all times for gait safety and fall prevention.  Please talk to your psychiatrist about the possibility of reducing some of the medications.  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   Please talk to your primary care about your water intake, drinking 8-10 bottles of water may be too much water and can cause dilution of your electrolytes and your blood.  She does check your blood regularly as I understand.  Nevertheless, please talk to her about your water intake at this time.  Please limit your caffeine to about 2 servings per day at the most.

## 2022-10-06 NOTE — Progress Notes (Signed)
Subjective:    Patient ID: Rebecca Roth is a 61 y.o. female.  HPI    Star Age, MD, PhD [facility] [address] [place], [place] [address]  Dear Rebecca Roth,  I saw your patient, Rebecca Roth, upon your kind request in my neurologic clinic today for initial consultation of her tremors.  The patient is accompanied by her husband today.  As you know, Rebecca Roth is a 61 year old right-handed woman with an underlying medical history of chronic kidney disease, thyroid disease, hypertension, diabetes, vitamin D deficiency, asthma, migraine headaches, anxiety, depression, and severe obesity with a BMI of over 40, who reports a several year history of tremors affecting both hands, probably at least 15 years.  Her husband provides some information but neither patient nor her husband have details on what medications she has been tried on for her tremors.  She reports having been on propranolol until about a month ago, she stopped it at the time but unclear of the dose that she was on.  She was recently started on Artane, maybe about a month ago or a few weeks ago.  She reports having fallen several times, in fact, she reports that she fell about 7 times in the past 2 months.  She reports that she has discussed her falls with you, no further recommendations were given as I understand.  She has a walker but has not used it consistently.  When she went on vacation about a month ago to [place], she did take her walker with her, she fell once while on vacation, slid out of bed she reports.  She did not fall while using a walker.  She has no family history of tremors or Parkinson's disease.  She had 2 brothers and 1 sister.  1 brother passed away at age 63.  She has been drinking quite a bit of water, estimates that he drinks about 8-10 bottles of water per day, 16 ounce size, had some blood work checked about a week ago, results are not available for my review  today.  She reports a history of chronic kidney disease but has not seen a kidney specialist.  She was encouraged to drink plenty of water as I understand but I am not sure why she has increased her water intake to 10 bottles a day.  She limits her caffeine to 2 cups of coffee typically, sometimes 1/3 cup, and occasional unsweet tea, about 3 days out of the week.  She does not drink any alcohol.  She is a non-smoker.  She lives with her husband. I reviewed your office note from 08/24/2022.  She reported tremors, difficulty with coordination, difficulty walking, difficulty rising from a chair and difficulty starting movement.  Of note, she is on multiple medications including several psychotropic medications.  She is followed by behavioral health.  She is currently on trihexyphenidyl 2 mg twice daily, Lamictal 150 mg twice daily, Risperdal 1 mg daily, fluoxetine 40 mg daily, buspirone 30 mg twice daily, clonazepam 0.5 mg daily.  You ordered a head CT with and without contrast.  The results are not available for my review today.  We will call your office for results.  She had a brain MRI with and without contrast recently through [facility] on 09/24/2022 and I reviewed the results:  IMPRESSION  No acute intracranial pathology.  Findings suggestive of mild chronic small vessel ischemic disease.   Of note, she has been followed  by [facility] neurology for obstructive sleep apnea.  She had also seen [facility] neurology in 2017 for tremors.  I reviewed the office note when she saw Dr. Rema Jasmine.  She was advised to lower her Prozac and was started on low-dose propranolol.     Her husband reports that he did some research and found that 3 of the medications she is taking caused tremors and memory issues and she does endorse having forgetfulness. She reports being faithful with her CPAP machine.  She reports sleeping well.  Her Past Medical History Is Significant For: Past Medical History:   Diagnosis Date   Anemia    Anxiety    Asthma    Carotid artery disease ([place])    Carotid US 07/06/16 [facility]): 50-69% RICA stenosis, < [id number] LICA stenosis    Cataract    right eye   Chronic kidney disease (CKD), stage III (moderate) (HCC)    Coronary artery disease    50% mid LCX, otherwise normal coronaries, medical therapy 12/04/15 ([place])   Depression    Diabetes mellitus without complication (HCC)    Frequent headaches    GERD (gastroesophageal reflux disease)    Hyperlipidemia    Hypertension    Hypothyroidism    IBS (irritable bowel syndrome)    Migraine    Swelling    Thyroid disease    Vitamin D deficiency    Wears glasses     Her Past Surgical History Is Significant For: Past Surgical History:  Procedure Laterality Date   APPENDECTOMY     CATARACT EXTRACTION W/PHACO Right 06/28/2020   Procedure: CATARACT EXTRACTION PHACO AND INTRAOCULAR LENS PLACEMENT ([place]);  Surgeon: Jalene Mullet, MD;  Location: [facility];  Service: Ophthalmology;  Laterality: Right;   CATARACT EXTRACTION W/PHACO Left 11/25/2021   Procedure: CATARACT EXTRACTION AND INTRAOCULAR LENS PLACEMENT ([facility]);  Surgeon: Jalene Mullet, MD;  Location: [facility];  Service: Ophthalmology;  Laterality: Left;   CHOLECYSTECTOMY     GALLBLADDER SURGERY     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   Procedure: REPAIR OF COMPLEX TRACTION RETINAL DETACHMENT, 25g vitrectomy, endolaser;  Surgeon: Jalene Mullet, MD;  Location: [place];  Service: Ophthalmology;  Laterality: Right;   WISDOM TOOTH EXTRACTION      Her Family History Is Significant For: Family History  Problem Relation Age of Onset   Stroke Mother    Heart disease Mother    Anemia Mother    Diabetes Mother    Heart attack Mother    Stroke Father    Diabetes Father    Pancreatic cancer Father    Arthritis Father    Hyperlipidemia Father    Diabetes Sister    Diabetes Brother    Kidney cancer Brother    Hypertension  Brother    Cancer Brother    Emphysema Maternal Grandfather     Her Social History Is Significant For: Social History   Socioeconomic History   Marital status: Married    Spouse name: Not on file   Number of children: Not on file   Years of education: Not on file   Highest education level: Not on file  Occupational History   Not on file  Tobacco Use   Smoking status: Never   Smokeless tobacco: Never  Vaping Use   Vaping Use: Never used  Substance and Sexual Activity   Alcohol use: No   Drug use: No   Sexual activity: Not on file  Other Topics Concern   Not on  file  Social History Narrative   Married, 2 children   Works retired.    Caffeine:  2 cups in am and 1 cup in pm   Social Determinants of Health   Financial Resource Strain: Not on file  Food Insecurity: Not on file  Transportation Needs: Not on file  Physical Activity: Not on file  Stress: Not on file  Social Connections: Not on file    Her Allergies Are:  Allergies  Allergen Reactions   Ace Inhibitors Cough    Lisinopril caused cough   Sulfa Antibiotics Swelling  :   Her Current Medications Are:  Outpatient Encounter Medications as of 10/06/2022  Medication Sig   albuterol (VENTOLIN HFA) 108 (90 Base) MCG/ACT inhaler Inhale 2 puffs into the lungs every 6 (six) hours as needed (Asthma).   aspirin EC 81 MG tablet Take 81 mg by mouth daily. Swallow whole.   atorvastatin (LIPITOR) 40 MG tablet Take 1 tablet (40 mg total) by mouth daily. (Patient taking differently: Take 40 mg by mouth at bedtime.)   BAYER CONTOUR TEST test strip    busPIRone (BUSPAR) 30 MG tablet TAKE 1 TABLET BY MOUTH TWICE DAILY   carvedilol (COREG) 6.25 MG tablet Take 6.25 mg by mouth 2 (two) times daily.   clonazePAM (KLONOPIN) 0.5 MG tablet Take 1 tablet (0.5 mg total) by mouth 3 (three) times daily as needed. for anxiety   etodolac (LODINE) 400 MG tablet TAKE 1 TABLET BY MOUTH TWICE A DAY WITH FOOD (Patient taking differently: Take  200 mg by mouth 2 (two) times daily.)   FARXIGA 10 MG TABS tablet Take 10 mg by mouth daily.   fenofibrate 54 MG tablet TAKE 1 TABLET BY MOUTH EVERY DAY   FLUoxetine (PROZAC) 40 MG capsule TAKE TWO (2) CAPSULES BY MOUTH ONCE DAILY   furosemide (LASIX) 40 MG tablet Take 40 mg by mouth daily.   insulin glargine, 2 Unit Dial, (TOUJEO MAX SOLOSTAR) 300 UNIT/ML Solostar Pen Inject 80 Units into the skin 2 (two) times daily.   Insulin Pen Needle (SURE COMFORT PEN NEEDLES) 31G X 5 MM MISC Use as directed   lamoTRIgine (LAMICTAL) 150 MG tablet TAKE 1 TABLET BY MOUTH TWICE DAILY   Lancet Devices (SIMPLE DIAGNOSTICS LANCING DEV) MISC    levocetirizine (XYZAL) 5 MG tablet Take 5 mg by mouth every evening.   levothyroxine (SYNTHROID) 137 MCG tablet Take 137 mcg by mouth daily.   montelukast (SINGULAIR) 10 MG tablet TAKE 1 TABLET BY MOUTH EVERY DAY (Patient taking differently: Take 10 mg by mouth daily.)   pantoprazole (PROTONIX) 40 MG tablet TAKE 1 TABLET BY MOUTH TWICE A DAY (Patient taking differently: Take 40 mg by mouth 2 (two) times daily.)   risperiDONE (RISPERDAL) 1 MG tablet TAKE 1 TABLET BY MOUTH AT BEDTIME   SYNJARDY XR 25-1000 MG TB24 TAKE 1 TABLET BY MOUTH EVERY DAY IN THE MORNING   trihexyphenidyl (ARTANE) 2 MG tablet Take 1 tablet (2 mg total) by mouth 2 (two) times daily with a meal.   valsartan (DIOVAN) 80 MG tablet Take 1 tablet (80 mg total) by mouth daily.   Vitamin D, Ergocalciferol, (DRISDOL) 50000 UNITS CAPS capsule Take 50,000 Units by mouth every Tuesday. At bedtime   [DISCONTINUED] LEVEMIR FLEXTOUCH 100 UNIT/ML Pen Inject 80 Units into the skin 2 (two) times daily.  (Patient not taking: Reported on 02/23/2022)   [DISCONTINUED] OZEMPIC, 0.25 OR 0.5 MG/DOSE, 2 MG/1.5ML SOPN Inject 5 mg into the skin once a week. (  Patient not taking: Reported on 10/06/2022)   No facility-administered encounter medications on file as of 10/06/2022.  :   Review of Systems:  Out of a complete 14 point  review of systems, all are reviewed and negative with the exception of these symptoms as listed below:  Review of Systems  Neurological:        Tremors several years ago, Bilateral hand R worse then left.     Objective:  Neurological Exam  Physical Exam Physical Examination:   Vitals:   10/06/22 0751  BP: (!) 105/54  Pulse: 70    General Examination: The patient is a very pleasant 61 y.o. female in no acute distress. She appears well-developed and well-nourished and well groomed.   HEENT: Normocephalic, atraumatic, pupils are equal, round and reactive to light, extraocular tracking is good without limitation to gaze excursion or nystagmus noted. Hearing is grossly intact. Face is symmetric with mild facial masking, no significant nuchal rigidity noted.  Speech is slow but not dysarthric, no significant hypophonia.  No carotid bruits.  Airway examination reveals moderate mouth dryness.  Tongue protrudes centrally and palate elevates symmetrically.  No lip, neck or jaw tremor.  Chest: Clear to auscultation without wheezing, rhonchi or crackles noted.  Heart: S1+S2+0, regular and normal without murmurs, rubs or gallops noted.   Abdomen: Soft, non-tender and non-distended.  Extremities: There is 1-2+ edema in the distal lower extremities bilaterally.  Seems worse on the left side.    Skin: Warm and dry without trophic changes noted.   Musculoskeletal: exam reveals no obvious joint deformities.   Neurologically:  Mental status: The patient is awake, alert and oriented in all 4 spheres. Her immediate and remote memory, attention, language skills and fund of knowledge are fairly appropriate but she does seem to respond slowly, often looks at her husband before responding.  She is unable to give details on the medications. Thought process is linear. Mood is decreased range and affect is blunted.  Cranial nerves II - XII are as described above under HEENT exam.  Motor exam: Normal bulk,  strength and tone is noted. There is no obvious resting tremor.  She has a mild to at times moderate postural tremor in both upper extremities, more noticeable on the right, minimal to mild action tremor, no lateralization. Fine motor skills and coordination: grossly intact.  In particular, no decrement in amplitude with finger taps, overall mild slowness noted, mild slowness with hand movements and rapid alternating patting, no lateralization.  Foot taps are mildly impaired, no lateralization.  Reflexes are 1+ throughout including ankles, toes are downgoing bilaterally.  On 10/06/2022: on Archimedes spiral drawing she has minimal trembling with the right hand, minimal trembling with the left hand, handwriting is mildly tremulous, legible, not micrographic.  Cerebellar testing: No dysmetria or intention tremor. There is no truncal or gait ataxia.  Sensory exam: intact to light touch in the upper and lower extremities.  Gait, station and balance: She stands with mild difficulty, posture is mildly stooped for age.  She walks slowly with decreased stride length and decreased arm swing bilaterally.  No walking aid.  Assessment and plan:   In summary, Itzel Konicki is a very pleasant 61 y.o.-year old female with an underlying medical history of chronic kidney disease, thyroid disease, hypertension, diabetes, vitamin D deficiency, asthma, migraine headaches, anxiety, depression, and severe obesity with a BMI of over 40, who presents for evaluation of her tremor disorder of many years duration.  She has  no family history of tremors or Parkinson's disease, she has a over 15-year history of hand tremors but has also noticed progression over time.  She is currently on several psychotropic medications including high-dose Prozac which can cause tremors.  She had previously tried a beta-blocker which she reports did not help, unclear of the final dose but stopped it about a month ago per her recollection.  She  has a lower blood pressure, I would not recommend addition of another beta-blocker, she is currently also on a different beta-blocker.  She is not a candidate for Mysoline given her polypharmacy and the possibility of increasing her balance issues, sleepiness and fall risk.  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  She is advised to continue follow-up closely with your office and her psychiatrist for the possibility of reducing some of her medications.  She was recently started as I understand within the past 6 months or few months on trihexyphenidyl.  She is advised that this can cause mouth dryness.  She is advised to talk to you about the amount of water she drinks.  She is encouraged to limit her caffeine intake and stay well-hydrated but drinking 8-10 bottles of water, 16.9 ounce size, he is quite a bit of water and she is encouraged to double check with you on this.  She is encouraged to use her walker at all times for gait safety and fall prevention.  She is advised that her brain MRI did not show any alarming findings, no structural reasons to have tremors of parkinsonism.  She is advised to follow-up as scheduled with her providers.  I did not suggest any new test or new medication from my end of things at this time.  I answered all the questions today and the patient and her husband were in agreement.   Thank you very much for allowing me to participate in the care of this nice patient. If I can be of any further assistance to you please do not hesitate to call me at [phone].  Sincerely,   Star Age, MD, PhD

## 2022-10-12 ENCOUNTER — Encounter: Payer: Self-pay | Admitting: Physician Assistant

## 2022-10-12 ENCOUNTER — Ambulatory Visit (INDEPENDENT_AMBULATORY_CARE_PROVIDER_SITE_OTHER): Payer: Commercial Managed Care - HMO | Admitting: Physician Assistant

## 2022-10-12 DIAGNOSIS — R296 Repeated falls: Secondary | ICD-10-CM

## 2022-10-12 DIAGNOSIS — F422 Mixed obsessional thoughts and acts: Secondary | ICD-10-CM | POA: Diagnosis not present

## 2022-10-12 DIAGNOSIS — F411 Generalized anxiety disorder: Secondary | ICD-10-CM | POA: Diagnosis not present

## 2022-10-12 DIAGNOSIS — R251 Tremor, unspecified: Secondary | ICD-10-CM | POA: Diagnosis not present

## 2022-10-12 DIAGNOSIS — Z79899 Other long term (current) drug therapy: Secondary | ICD-10-CM

## 2022-10-12 DIAGNOSIS — F3342 Major depressive disorder, recurrent, in full remission: Secondary | ICD-10-CM

## 2022-10-12 MED ORDER — CLONAZEPAM 0.5 MG PO TABS: 0.5000 mg | ORAL_TABLET | Freq: Three times a day (TID) | ORAL | 1 refills | Status: DC | PRN

## 2022-10-12 NOTE — Patient Instructions (Addendum)
Wean off Risperdal (Risperdone) 1 mg by taking 1/2 pill every night for 4 nights and then stop.  In 2 weeks, wean off the Artane (trihexyphenidyl) 2 mg by taking 1/2 pill twice a day for 1 week, then 1/2 pill once a day for 1 week, then stop.)

## 2022-10-12 NOTE — Progress Notes (Signed)
[facility] Check  Patient ID: Rebecca Roth,  MRN: [id number]  PCP: [facility]  Date of Evaluation: 10/12/2022 Time spent:40 minutes  Chief Complaint:  Chief Complaint   Memory Loss; Anxiety; Depression; Follow-up     HISTORY/CURRENT STATUS: HPI For routine med check. Accompanied by husband, Rebecca Roth.  Has fallen 7 times in past 2 months. Had neuro consult last week. MRI showed no acute pathology. Husband wonders if some of her probs could be from psych meds. Only med change recently was adding Artane. Tremor has improved slightly. No syncope. No vertigo.   Energy and motivation are fair to good. She quit her job.  No extreme sadness, tearfulness, or feelings of hopelessness.  Sleeps well most of the time. ADLs and personal hygiene are normal.   Denies any changes in concentration, making decisions, or remembering things.  Appetite has not changed.  Weight is stable.  Denies suicidal or homicidal thoughts.  Patient denies increased energy with decreased need for sleep, increased talkativeness, racing thoughts, impulsivity or risky behaviors, increased spending, increased libido, grandiosity, increased irritability or anger, paranoia, or hallucinations.  Anxiety has been worse. Klonopin helps. Feels panicky when she doesn't take it.   Review of Systems  Constitutional: Negative.   HENT: Negative.    Eyes: Negative.   Respiratory: Negative.    Cardiovascular: Negative.   Gastrointestinal: Negative.   Genitourinary: Negative.   Musculoskeletal: Negative.   Skin: Negative.   Neurological:        See HPI  Endo/Heme/Allergies: Negative.   Psychiatric/Behavioral:         See HPI   Individual Medical History/ Review of Systems: Changes? :Yes    see HPI and records on chart  Past medications for mental health diagnoses include: Cymbalta, Depakote, Effexor, Celexa Wellbutrin, Abilify, Zoloft ? Lexapro?, Prozac, Buspar, Klonopin, Rexulti was  effective but too expensive.  Allergies: Ace inhibitors and Sulfa antibiotics  Current Medications:  Current Outpatient Medications:    albuterol (VENTOLIN HFA) 108 (90 Base) MCG/ACT inhaler, Inhale 2 puffs into the lungs every 6 (six) hours as needed (Asthma)., Disp: , Rfl:    aspirin EC 81 MG tablet, Take 81 mg by mouth daily. Swallow whole., Disp: , Rfl:    atorvastatin (LIPITOR) 40 MG tablet, Take 1 tablet (40 mg total) by mouth daily. (Patient taking differently: Take 40 mg by mouth at bedtime.), Disp: 30 tablet, Rfl: 2   BAYER CONTOUR TEST test strip, , Disp: , Rfl:    bumetanide (BUMEX) 1 MG tablet, Take 1 mg by mouth daily., Disp: , Rfl:    busPIRone (BUSPAR) 30 MG tablet, TAKE 1 TABLET BY MOUTH TWICE DAILY, Disp: 60 tablet, Rfl: 10   carvedilol (COREG) 6.25 MG tablet, Take 6.25 mg by mouth 2 (two) times daily., Disp: , Rfl:    etodolac (LODINE) 400 MG tablet, TAKE 1 TABLET BY MOUTH TWICE A DAY WITH FOOD (Patient taking differently: Take 200 mg by mouth 2 (two) times daily.), Disp: 60 tablet, Rfl: 2   FARXIGA 10 MG TABS tablet, Take 10 mg by mouth daily., Disp: , Rfl:    fenofibrate 54 MG tablet, TAKE 1 TABLET BY MOUTH EVERY DAY, Disp: 30 tablet, Rfl: 2   FLUoxetine (PROZAC) 40 MG capsule, TAKE TWO (2) CAPSULES BY MOUTH ONCE DAILY, Disp: 60 capsule, Rfl: 11   furosemide (LASIX) 40 MG tablet, Take 40 mg by mouth daily., Disp: , Rfl:    insulin glargine, 2 Unit Dial, (TOUJEO MAX SOLOSTAR) 300 UNIT/ML Solostar  Pen, Inject 80 Units into the skin 2 (two) times daily., Disp: , Rfl:    Insulin Pen Needle (SURE COMFORT PEN NEEDLES) 31G X 5 MM MISC, Use as directed, Disp: 30 each, Rfl: 1   lamoTRIgine (LAMICTAL) 150 MG tablet, TAKE 1 TABLET BY MOUTH TWICE DAILY, Disp: 60 tablet, Rfl: 10   Lancet Devices (SIMPLE DIAGNOSTICS LANCING DEV) MISC, , Disp: , Rfl:    levocetirizine (XYZAL) 5 MG tablet, Take 5 mg by mouth every evening., Disp: , Rfl:    levothyroxine (SYNTHROID) 137 MCG tablet, Take 137  mcg by mouth daily., Disp: , Rfl:    montelukast (SINGULAIR) 10 MG tablet, TAKE 1 TABLET BY MOUTH EVERY DAY (Patient taking differently: Take 10 mg by mouth daily.), Disp: 90 tablet, Rfl: 1   pantoprazole (PROTONIX) 40 MG tablet, TAKE 1 TABLET BY MOUTH TWICE A DAY (Patient taking differently: Take 40 mg by mouth 2 (two) times daily.), Disp: 60 tablet, Rfl: 2   risperiDONE (RISPERDAL) 1 MG tablet, TAKE 1 TABLET BY MOUTH AT BEDTIME, Disp: 30 tablet, Rfl: 10   trihexyphenidyl (ARTANE) 2 MG tablet, Take 1 tablet (2 mg total) by mouth 2 (two) times daily with a meal., Disp: 180 tablet, Rfl: 1   valsartan (DIOVAN) 80 MG tablet, Take 1 tablet (80 mg total) by mouth daily., Disp: 90 tablet, Rfl: 0   Vitamin D, Ergocalciferol, (DRISDOL) 50000 UNITS CAPS capsule, Take 50,000 Units by mouth every Tuesday. At bedtime, Disp: , Rfl:    clonazePAM (KLONOPIN) 0.5 MG tablet, Take 1 tablet (0.5 mg total) by mouth 3 (three) times daily as needed. for anxiety, Disp: 30 tablet, Rfl: 1   SYNJARDY XR 25-1000 MG TB24, TAKE 1 TABLET BY MOUTH EVERY DAY IN THE MORNING (Patient not taking: Reported on 10/12/2022), Disp: 30 tablet, Rfl: 2 Medication Side Effects: none  Family Medical/ Social History: Changes?  no  MENTAL HEALTH EXAM:  There were no vitals taken for this visit.There is no height or weight on file to calculate BMI.  General Appearance: Casual, Neat, Well Groomed and Obese  Eye Contact:  Good  Speech:  Clear and Coherent and Normal Rate  Volume:  Normal  Mood:  Anxious  Affect:  Congruent  Thought Process:  Goal Directed and Descriptions of Associations: Circumstantial  Orientation:  Full (Time, Place, and Person)  Thought Content: Logical   Suicidal Thoughts:  No  Homicidal Thoughts:  No  Memory:  WNL  Judgement:  Good  Insight:  Good  Psychomotor Activity:  Normal  Concentration:  Concentration: Good and Attention Span: Good  Recall:  Good  Fund of Knowledge: Good  Language: Good  Assets:   Desire for Improvement  ADL's:  Intact  Cognition: WNL  Prognosis:  Good   Labs are followed by her PCP  Note from Neuro 10/06/2022, Dr. Frances Furbish, reviewed.   DIAGNOSES:    ICD-10-CM   1. Recurrent major depressive disorder, in full remission (HCC)  F33.42 CBC with Differential/Platelet    Comprehensive metabolic panel    2. Tremor of both outstretched hands  R25.1     3. Mixed obsessional thoughts and acts  F42.2     4. Generalized anxiety disorder  F41.1     5. Falls frequently  R29.6     6. Encounter for long-term (current) use of medications  Z79.899 CBC with Differential/Platelet    Comprehensive metabolic panel      Receiving Psychotherapy: Yes   with Rebecca Roth, [facility].   RECOMMENDATIONS:  PDMP reviewed.  Klonopin filled 12/26/2020. I provided 40 minutes of face to face time during this encounter, including time spent before and after the visit in records review, medical decision making, counseling pertinent to today's visit, and charting.   Long disc about sx, whether psych meds cause falling. Recommend weaning off Risperdal, can cause tremor and unsure how much it's needed now. Then can wean off Artane.  Pt wants to try this.   Also check labs for low Na or other electrolyte issues.   Continue BuSpar 30 mg, 1 p.o. twice daily. Continue Klonopin 0.5 mg, 1 p.o. 3 times daily as needed.   Continue Prozac 40 mg, 2 p.o. daily. Continue Lamictal 150 mg, 1 p.o. twice daily.    Wean off Risperdal (Risperdone) 1 mg by taking 1/2 pill every night for 4 nights and then stop.  In 2 weeks, wean off the Artane (trihexyphenidyl) 2 mg by taking 1/2 pill twice a day for 1 week, then 1/2 pill once a day for 1 week, then stop.)   Continue vitamin D, add multivitamin and B complex.  Fish oil also. Continue CPAP use. Continue therapy with Rebecca Roth, [facility]. Labs ordered as above.  Return in 4 wks.   Melony Overly, PA-C

## 2022-10-13 LAB — CBC WITH DIFFERENTIAL/PLATELET
Basophils Absolute: 0 10*3/uL (ref 0.0–0.2)
Basos: 1 %
EOS (ABSOLUTE): 0.2 10*3/uL (ref 0.0–0.4)
Eos: 2 %
Hematocrit: 38.3 % (ref 34.0–46.6)
Hemoglobin: 12.1 g/dL (ref 11.1–15.9)
Immature Grans (Abs): 0 10*3/uL (ref 0.0–0.1)
Immature Granulocytes: 0 %
Lymphocytes Absolute: 1.8 10*3/uL (ref 0.7–3.1)
Lymphs: 24 %
MCH: 28.2 pg (ref 26.6–33.0)
MCHC: 31.6 g/dL (ref 31.5–35.7)
MCV: 89 fL (ref 79–97)
Monocytes Absolute: 0.6 10*3/uL (ref 0.1–0.9)
Monocytes: 8 %
Neutrophils Absolute: 4.8 10*3/uL (ref 1.4–7.0)
Neutrophils: 65 %
Platelets: 326 10*3/uL (ref 150–450)
RBC: 4.29 x10E6/uL (ref 3.77–5.28)
RDW: 13.2 % (ref 11.7–15.4)
WBC: 7.4 10*3/uL (ref 3.4–10.8)

## 2022-10-13 LAB — COMPREHENSIVE METABOLIC PANEL
ALT: 23 IU/L (ref 0–32)
AST: 23 IU/L (ref 0–40)
Albumin/Globulin Ratio: 1.7 (ref 1.2–2.2)
Albumin: 4.7 g/dL (ref 3.9–4.9)
Alkaline Phosphatase: 70 IU/L (ref 44–121)
BUN/Creatinine Ratio: 17 (ref 12–28)
BUN: 23 mg/dL (ref 8–27)
Bilirubin Total: 0.3 mg/dL (ref 0.0–1.2)
CO2: 26 mmol/L (ref 20–29)
Calcium: 9.3 mg/dL (ref 8.7–10.3)
Chloride: 98 mmol/L (ref 96–106)
Creatinine, Ser: 1.32 mg/dL — ABNORMAL HIGH (ref 0.57–1.00)
Globulin, Total: 2.7 g/dL (ref 1.5–4.5)
Glucose: 194 mg/dL — ABNORMAL HIGH (ref 70–99)
Potassium: 3.9 mmol/L (ref 3.5–5.2)
Sodium: 141 mmol/L (ref 134–144)
Total Protein: 7.4 g/dL (ref 6.0–8.5)
eGFR: 46 mL/min/{1.73_m2} — ABNORMAL LOW (ref >59)

## 2022-10-15 ENCOUNTER — Other Ambulatory Visit: Payer: Self-pay

## 2022-10-15 ENCOUNTER — Telehealth: Payer: Self-pay

## 2022-10-15 NOTE — Telephone Encounter (Signed)
Please pend 1 mg #45, 1/2 po tid prn. Thanks

## 2022-10-15 NOTE — Progress Notes (Signed)
Patient informed. 

## 2022-10-15 NOTE — Progress Notes (Signed)
Please let her know that her sodium level is normal, blood sugar 194, liver function test normal, creatinine is slightly elevated at 1.3 but is the same as it was 10 months ago.  Continue same treatment.

## 2022-10-16 ENCOUNTER — Other Ambulatory Visit: Payer: Self-pay

## 2022-10-16 MED ORDER — CLONAZEPAM 1 MG PO TABS: 0.5000 mg | ORAL_TABLET | Freq: Three times a day (TID) | ORAL | 1 refills | Status: DC | PRN

## 2022-10-16 NOTE — Telephone Encounter (Signed)
Pended.

## 2022-10-23 ENCOUNTER — Ambulatory Visit: Payer: Commercial Managed Care - HMO | Admitting: Physician Assistant

## 2022-10-23 ENCOUNTER — Ambulatory Visit: Payer: Commercial Managed Care - HMO | Admitting: Mental Health

## 2022-11-13 ENCOUNTER — Ambulatory Visit (INDEPENDENT_AMBULATORY_CARE_PROVIDER_SITE_OTHER): Payer: Commercial Managed Care - HMO | Admitting: Physician Assistant

## 2022-11-13 ENCOUNTER — Ambulatory Visit (INDEPENDENT_AMBULATORY_CARE_PROVIDER_SITE_OTHER): Payer: Commercial Managed Care - HMO | Admitting: Mental Health

## 2022-11-13 ENCOUNTER — Encounter: Payer: Self-pay | Admitting: Physician Assistant

## 2022-11-13 DIAGNOSIS — R251 Tremor, unspecified: Secondary | ICD-10-CM | POA: Diagnosis not present

## 2022-11-13 DIAGNOSIS — F331 Major depressive disorder, recurrent, moderate: Secondary | ICD-10-CM

## 2022-11-13 DIAGNOSIS — F422 Mixed obsessional thoughts and acts: Secondary | ICD-10-CM | POA: Diagnosis not present

## 2022-11-13 DIAGNOSIS — F3342 Major depressive disorder, recurrent, in full remission: Secondary | ICD-10-CM

## 2022-11-13 MED ORDER — LAMOTRIGINE 150 MG PO TABS: 150.0000 mg | ORAL_TABLET | Freq: Two times a day (BID) | ORAL | 11 refills | Status: DC

## 2022-11-13 MED ORDER — LAMOTRIGINE 200 MG PO TABS: 200.0000 mg | ORAL_TABLET | Freq: Two times a day (BID) | ORAL | 0 refills | Status: DC

## 2022-11-13 MED ORDER — FLUOXETINE HCL 40 MG PO CAPS: ORAL_CAPSULE | ORAL | 11 refills | Status: DC

## 2022-11-13 MED ORDER — LAMOTRIGINE 200 MG PO TABS: 200.0000 mg | ORAL_TABLET | Freq: Two times a day (BID) | ORAL | 11 refills | Status: DC

## 2022-11-13 NOTE — Progress Notes (Unsigned)
[facility]  Patient ID: Rebecca Roth,  MRN: [id number]  PCP: [facility]  Date of Evaluation: 11/13/2022 Time spent:30 minutes  Chief Complaint:  Chief Complaint   Anxiety; Depression; Follow-up    HISTORY/CURRENT STATUS: HPI For routine med check.  Doing much better, no falls after stopping Risperdal and Artane. Tremor is better as well. Still there at times but is tolerable.   Patient is able to enjoy things but feels like her mood is 'down' more often than it had been. Feels blue. Low energy and motivation.  Sleeps well.  ADLs and personal hygiene are normal.   Denies any changes in concentration, making decisions, or remembering things.  Appetite has not changed.  Weight is stable. Denies suicidal or homicidal thoughts.  Anxiety is controlled w/ Buspar and Klonopin. Doesn't take the Klonopin unless absolutely necessary, when she feels panicky or can't relax to go to sleep.   Patient denies increased energy with decreased need for sleep, increased talkativeness, racing thoughts, impulsivity or risky behaviors, increased spending, increased libido, grandiosity, increased irritability or anger, paranoia, or hallucinations.  Denies dizziness, syncope, seizures, numbness, tingling, tics, unsteady gait, slurred speech, confusion. Denies muscle or joint pain, stiffness, or dystonia. Denies unexplained weight loss, frequent infections, or sores that heal slowly.  No polyphagia, polydipsia, or polyuria. Denies visual changes or paresthesias.   Individual Medical History/ Review of Systems: Changes? :No     Past medications for mental health diagnoses include: Cymbalta, Depakote, Effexor, Celexa Wellbutrin, Abilify, Zoloft ? Lexapro?, Prozac, Buspar, Klonopin, Rexulti was effective but too expensive. Risperdal caused falls, Artane caused Falls  Allergies: Artane [trihexyphenidyl], Risperdal [risperidone], Ace inhibitors, and Sulfa  antibiotics  Current Medications:  Current Outpatient Medications:    albuterol (VENTOLIN HFA) 108 (90 Base) MCG/ACT inhaler, Inhale 2 puffs into the lungs every 6 (six) hours as needed (Asthma)., Disp: , Rfl:    aspirin EC 81 MG tablet, Take 81 mg by mouth daily. Swallow whole., Disp: , Rfl:    BAYER CONTOUR TEST test strip, , Disp: , Rfl:    bumetanide (BUMEX) 1 MG tablet, Take 1 mg by mouth daily., Disp: , Rfl:    busPIRone (BUSPAR) 30 MG tablet, TAKE 1 TABLET BY MOUTH TWICE DAILY, Disp: 60 tablet, Rfl: 10   carvedilol (COREG) 6.25 MG tablet, Take 6.25 mg by mouth 2 (two) times daily., Disp: , Rfl:    clonazePAM (KLONOPIN) 1 MG tablet, Take 0.5 tablets (0.5 mg total) by mouth 3 (three) times daily as needed for anxiety., Disp: 45 tablet, Rfl: 1   etodolac (LODINE) 400 MG tablet, TAKE 1 TABLET BY MOUTH TWICE A DAY WITH FOOD (Patient taking differently: Take 200 mg by mouth 2 (two) times daily.), Disp: 60 tablet, Rfl: 2   fenofibrate 54 MG tablet, TAKE 1 TABLET BY MOUTH EVERY DAY, Disp: 30 tablet, Rfl: 2   furosemide (LASIX) 40 MG tablet, Take 40 mg by mouth daily., Disp: , Rfl:    insulin glargine, 2 Unit Dial, (TOUJEO MAX SOLOSTAR) 300 UNIT/ML Solostar Pen, Inject 80 Units into the skin 2 (two) times daily., Disp: , Rfl:    Insulin Pen Needle (SURE COMFORT PEN NEEDLES) 31G X 5 MM MISC, Use as directed, Disp: 30 each, Rfl: 1   lamoTRIgine (LAMICTAL) 200 MG tablet, Take 1 tablet (200 mg total) by mouth 2 (two) times daily., Disp: 60 tablet, Rfl: 11   lamoTRIgine (LAMICTAL) 200 MG tablet, Take 1 tablet (200 mg total) by mouth 2 (two) times  daily., Disp: 60 tablet, Rfl: 0   Lancet Devices (SIMPLE DIAGNOSTICS LANCING DEV) MISC, , Disp: , Rfl:    levocetirizine (XYZAL) 5 MG tablet, Take 5 mg by mouth every evening., Disp: , Rfl:    levothyroxine (SYNTHROID) 137 MCG tablet, Take 137 mcg by mouth daily., Disp: , Rfl:    montelukast (SINGULAIR) 10 MG tablet, TAKE 1 TABLET BY MOUTH EVERY DAY (Patient  taking differently: Take 10 mg by mouth daily.), Disp: 90 tablet, Rfl: 1   pantoprazole (PROTONIX) 40 MG tablet, TAKE 1 TABLET BY MOUTH TWICE A DAY (Patient taking differently: Take 40 mg by mouth 2 (two) times daily.), Disp: 60 tablet, Rfl: 2   valsartan (DIOVAN) 80 MG tablet, Take 1 tablet (80 mg total) by mouth daily., Disp: 90 tablet, Rfl: 0   Vitamin D, Ergocalciferol, (DRISDOL) 50000 UNITS CAPS capsule, Take 50,000 Units by mouth every Tuesday. At bedtime, Disp: , Rfl:    atorvastatin (LIPITOR) 40 MG tablet, Take 1 tablet (40 mg total) by mouth daily. (Patient taking differently: Take 40 mg by mouth at bedtime.), Disp: 30 tablet, Rfl: 2   FARXIGA 10 MG TABS tablet, Take 10 mg by mouth daily. (Patient not taking: Reported on 11/13/2022), Disp: , Rfl:    FLUoxetine (PROZAC) 40 MG capsule, TAKE TWO (2) CAPSULES BY MOUTH ONCE DAILY, Disp: 60 capsule, Rfl: 11   SYNJARDY XR 25-1000 MG TB24, TAKE 1 TABLET BY MOUTH EVERY DAY IN THE MORNING (Patient not taking: Reported on 10/12/2022), Disp: 30 tablet, Rfl: 2 Medication Side Effects: none  Family Medical/ Social History: Changes?  no  MENTAL HEALTH EXAM:  There were no vitals taken for this visit.There is no height or weight on file to calculate BMI.  General Appearance: Casual, Neat, Well Groomed and Obese  Eye Contact:  Good  Speech:  Clear and Coherent and Normal Rate  Volume:  Normal  Mood:  Euthymic  Affect:  Congruent  Thought Process:  Goal Directed and Descriptions of Associations: Circumstantial  Orientation:  Full (Time, Place, and Person)  Thought Content: Logical   Suicidal Thoughts:  No  Homicidal Thoughts:  No  Memory:  WNL  Judgement:  Good  Insight:  Good  Psychomotor Activity:   mild fine motor tremor of hands bilat  Concentration:  Concentration: Good and Attention Span: Good  Recall:  Good  Fund of Knowledge: Good  Language: Good  Assets:  Desire for Improvement  ADL's:  Intact  Cognition: WNL  Prognosis:  Good    Labs 10/12/2022 CBC w/ diff nl CMP Glu 194, Cr 1.3, GFR 46  DIAGNOSES:    ICD-10-CM   1. Major depressive disorder, recurrent episode, moderate (HCC)  F33.1     2. Mixed obsessional thoughts and acts  F42.2     3. Tremor of both outstretched hands  R25.1       Receiving Psychotherapy: Yes   with Elio Forget, [facility].  RECOMMENDATIONS:  PDMP reviewed.  Klonopin filled 10/16/2022. I provided 30 minutes of face to face time during this encounter, including time spent before and after the visit in records review, medical decision making, counseling pertinent to today's visit, and charting.   Since going off Risperdal, she has had no further falls. Is having mild depressive sx. Recommend increasing Lamictal. Discussed benefits of that and she would like to try it. No other changes will be made.   Continue BuSpar 30 mg, 1 p.o. twice daily. Continue Klonopin 1 mg, 1/2 p.o. 3 times daily as  needed.   Continue Prozac 40 mg, 2 p.o. daily. Increase Lamictal to 200 mg po bid.  Continue vitamin D, add multivitamin and B complex.  Fish oil also. Continue CPAP use. Continue therapy with Elio Forget, [facility]. Return in 6 wks.   Rebecca Overly, PA-C

## 2022-11-13 NOTE — Progress Notes (Signed)
[facility] Counselor/Therapist Progress Note     Patient ID: Rebecca Roth, MRN: [id number]   Date: 11/13/22   Timespent: 55 minutes   Treatment Type: Individual   Mental Status Exam:        Appearance:    Casual     Behavior:   Appropriate  Motor:   Normal  Speech/Language:    Normal Rate  Affect:   Full Range  Mood:   Euthymic  Thought process:   Coherent and Relevant  Thought content:     Logical  Perceptual disturbances:     Normal  Orientation:   Full (Time, Place, and Person)  Attention:   Good  Concentration:   good  Memory:    Intact  Fund of knowledge:    Good  Insight:     Good  Judgment:    Good  Impulse Control:   good     Reported Symptoms:  anxiety,  deficits with attention and concentration, history of panic attacks   Risk Assessment: Danger to Self:  No Self-injurious Behavior: No Danger to Others: No Duty to Warn:no Physical Aggression / Violence:No  Access to Firearms a concern: No  Gang Involvement:No  Patient / guardian was educated about steps to take if suicide or homicide risk level increases between visits. While future psychiatric events cannot be accurately predicted, the patient does not currently require acute inpatient psychiatric care and does not currently meet [facility] involuntary commitment criteria.   Subjective: Patient shared progress since last visit.  Recent events and progress were explored.  Patient shared how she had a challenging experience about 1 month ago.  She stated that she had a medication change which resulted in her having significant short-term memory impairment.  She went on to give examples, provided details.  She stated that she has discontinued that medication as recommended and the symptoms have continued to wane.  Facilitated her identifying other stressors where she stated that her 2 children continue to have significant strain in the relationship that has lasted for 2 years up to this point.   Explored with patient feelings related, facilitating her identifying ways to cope where she identified ways she sets interpersonal boundaries, being mindful of related thoughts that elicit more anxiety and stress; we explored ways to reframe in session.  She shared how she has felt more depressed over the last few weeks, plans to follow-up today at her med management appointment attributing it primarily to needing a medication change.  Provided support and encouragement with the patient with a focus on her resilience, continue to work from a strengths based, cognitive behavioral framework.   Interventions:CBT, Solution Focused and Strength-based   Diagnosis:     ICD-10-CM    1. Recurrent major depressive disorder, in full remission (HCC)  F33.42                 Plan:  1.  Patient to continue to engage in individual counseling 2-4 times a month or as needed. 2.  Patient to identify and apply CBT, coping skills learned in session to decrease depression and anxiety symptoms. 3.  Patient to improve effective communication and assertiveness with others. 4.  Patient to decrease obsessive compulsive behaviors with systematic desensitization as discussed in session. 5.  Patient to contact this office, go to the local ED or call 911 if a crisis or emergency develops between visits.   Waldron Session, [facility]

## 2022-12-07 ENCOUNTER — Telehealth: Payer: Self-pay | Admitting: Physician Assistant

## 2022-12-07 NOTE — Telephone Encounter (Signed)
Pt LVM @ 2:01p.  She said she "is not doing well".  She wanted to know if she could have Teresa's urgent appt slot on Thurs.  Beth told me to wait and check with Rosey Bath.  I called pt back to advise her that I would find out from [place] about the urgent appt, but asked her if she wanted to speak to the nurses because in her previous voice mail she was crying  toward the end of the message.  She said no.  So just need Rosey Bath to advise about the urgent appt spot opening on Thur 12/14.  Next appt 12/29

## 2022-12-08 NOTE — Telephone Encounter (Signed)
Yes, add in urgent spot 12/14 if not already full. If so, ok to add at my first available urgent spot

## 2022-12-08 NOTE — Telephone Encounter (Signed)
Please review

## 2022-12-09 NOTE — Telephone Encounter (Signed)
Pt has appt 12/14 in your urgent spot

## 2022-12-10 ENCOUNTER — Encounter: Payer: Self-pay | Admitting: Physician Assistant

## 2022-12-10 ENCOUNTER — Other Ambulatory Visit: Payer: Self-pay | Admitting: Physician Assistant

## 2022-12-10 ENCOUNTER — Ambulatory Visit (INDEPENDENT_AMBULATORY_CARE_PROVIDER_SITE_OTHER): Payer: Commercial Managed Care - HMO | Admitting: Physician Assistant

## 2022-12-10 DIAGNOSIS — F411 Generalized anxiety disorder: Secondary | ICD-10-CM | POA: Diagnosis not present

## 2022-12-10 DIAGNOSIS — F331 Major depressive disorder, recurrent, moderate: Secondary | ICD-10-CM | POA: Diagnosis not present

## 2022-12-10 DIAGNOSIS — R251 Tremor, unspecified: Secondary | ICD-10-CM | POA: Diagnosis not present

## 2022-12-10 DIAGNOSIS — F422 Mixed obsessional thoughts and acts: Secondary | ICD-10-CM

## 2022-12-10 MED ORDER — RISPERIDONE 0.5 MG PO TABS: ORAL_TABLET | ORAL | 0 refills | Status: DC

## 2022-12-10 NOTE — Progress Notes (Signed)
[facility]  Patient ID: Rebecca Roth,  MRN: [id number]  PCP: [facility]  Date of Evaluation: 12/10/2022 Time spent:30 minutes  Chief Complaint:  Chief Complaint   Anxiety; Depression    HISTORY/CURRENT STATUS: HPI Seen urgently, depressed  For weeks, she has been more depressed, doesn't get out of bed and get dressed till 1 or 2 PM. Has cried a lot this week, no known trigger. Started after we d/c Risperdal a few months ago. Very tired all the time. Sleeps well with CPAP. Doesn't do anything. ADLs and personal hygiene are nl. Appetite is fair. No SI/HI.   Risperdal d/c a few mo ago b/c of tremor and falling after starting Artane. No falls since then. States tremor is still an issue, not as bad, but still there. Not affecting ADLs. Anxiety has been worse since depression has worsened. Feels panicky, Klonopin helps.   Patient denies increased energy with decreased need for sleep, increased talkativeness, racing thoughts, impulsivity or risky behaviors, increased spending, increased libido, grandiosity, increased irritability or anger, paranoia, or hallucinations.  Denies dizziness, syncope, seizures, numbness, tingling, tics, unsteady gait, slurred speech, confusion. Denies muscle or joint pain, stiffness, or dystonia. Denies unexplained weight loss, frequent infections, or sores that heal slowly.  No polyphagia, polydipsia, or polyuria. Denies visual changes or paresthesias.   Individual Medical History/ Review of Systems: Changes? :No     Past medications for mental health diagnoses include: Cymbalta, Depakote, Effexor, Celexa Wellbutrin, Abilify, Zoloft ? Lexapro?, Prozac, Buspar, Klonopin, Rexulti was effective but too expensive. Risperdal caused falls, Artane caused Falls  Allergies: Artane [trihexyphenidyl], Risperdal [risperidone], Ace inhibitors, and Sulfa antibiotics  Current Medications:  Current Outpatient Medications:     albuterol (VENTOLIN HFA) 108 (90 Base) MCG/ACT inhaler, Inhale 2 puffs into the lungs every 6 (six) hours as needed (Asthma)., Disp: , Rfl:    aspirin EC 81 MG tablet, Take 81 mg by mouth daily. Swallow whole., Disp: , Rfl:    atorvastatin (LIPITOR) 40 MG tablet, Take 1 tablet (40 mg total) by mouth daily. (Patient taking differently: Take 40 mg by mouth at bedtime.), Disp: 30 tablet, Rfl: 2   BAYER CONTOUR TEST test strip, , Disp: , Rfl:    busPIRone (BUSPAR) 30 MG tablet, TAKE 1 TABLET BY MOUTH TWICE DAILY, Disp: 60 tablet, Rfl: 10   carvedilol (COREG) 6.25 MG tablet, Take 6.25 mg by mouth 2 (two) times daily., Disp: , Rfl:    clonazePAM (KLONOPIN) 1 MG tablet, Take 0.5 tablets (0.5 mg total) by mouth 3 (three) times daily as needed for anxiety., Disp: 45 tablet, Rfl: 1   etodolac (LODINE) 400 MG tablet, TAKE 1 TABLET BY MOUTH TWICE A DAY WITH FOOD (Patient taking differently: Take 200 mg by mouth 2 (two) times daily.), Disp: 60 tablet, Rfl: 2   FARXIGA 10 MG TABS tablet, Take 10 mg by mouth daily., Disp: , Rfl:    fenofibrate 54 MG tablet, TAKE 1 TABLET BY MOUTH EVERY DAY, Disp: 30 tablet, Rfl: 2   FLUoxetine (PROZAC) 40 MG capsule, TAKE TWO (2) CAPSULES BY MOUTH ONCE DAILY, Disp: 60 capsule, Rfl: 11   furosemide (LASIX) 40 MG tablet, Take 40 mg by mouth daily., Disp: , Rfl:    insulin glargine, 2 Unit Dial, (TOUJEO MAX SOLOSTAR) 300 UNIT/ML Solostar Pen, Inject 80 Units into the skin 2 (two) times daily., Disp: , Rfl:    Insulin Pen Needle (SURE COMFORT PEN NEEDLES) 31G X 5 MM MISC, Use as directed, Disp:  30 each, Rfl: 1   lamoTRIgine (LAMICTAL) 200 MG tablet, Take 1 tablet (200 mg total) by mouth 2 (two) times daily., Disp: 60 tablet, Rfl: 11   Lancet Devices (SIMPLE DIAGNOSTICS LANCING DEV) MISC, , Disp: , Rfl:    levocetirizine (XYZAL) 5 MG tablet, Take 5 mg by mouth every evening., Disp: , Rfl:    levothyroxine (SYNTHROID) 137 MCG tablet, Take 137 mcg by mouth daily., Disp: , Rfl:     montelukast (SINGULAIR) 10 MG tablet, TAKE 1 TABLET BY MOUTH EVERY DAY (Patient taking differently: Take 10 mg by mouth daily.), Disp: 90 tablet, Rfl: 1   pantoprazole (PROTONIX) 40 MG tablet, TAKE 1 TABLET BY MOUTH TWICE A DAY (Patient taking differently: Take 40 mg by mouth 2 (two) times daily.), Disp: 60 tablet, Rfl: 2   risperiDONE (RISPERDAL) 0.5 MG tablet, 1 po qhs for 3 nights then increase to 2 po qhs., Disp: 60 tablet, Rfl: 0   valsartan (DIOVAN) 80 MG tablet, Take 1 tablet (80 mg total) by mouth daily., Disp: 90 tablet, Rfl: 0   Vitamin D, Ergocalciferol, (DRISDOL) 50000 UNITS CAPS capsule, Take 50,000 Units by mouth every Tuesday. At bedtime, Disp: , Rfl:    bumetanide (BUMEX) 1 MG tablet, Take 1 mg by mouth daily., Disp: , Rfl:    SYNJARDY XR 25-1000 MG TB24, TAKE 1 TABLET BY MOUTH EVERY DAY IN THE MORNING (Patient not taking: Reported on 10/12/2022), Disp: 30 tablet, Rfl: 2 Medication Side Effects: none  Family Medical/ Social History: Changes?  no  MENTAL HEALTH EXAM:  There were no vitals taken for this visit.There is no height or weight on file to calculate BMI.  General Appearance: Casual, Neat, Well Groomed and Obese  Eye Contact:  Good  Speech:  Clear and Coherent and Normal Rate  Volume:  Normal  Mood:  Anxious and Depressed  Affect:  Congruent  Thought Process:  Goal Directed and Descriptions of Associations: Circumstantial  Orientation:  Full (Time, Place, and Person)  Thought Content: Logical   Suicidal Thoughts:  No  Homicidal Thoughts:  No  Memory:  WNL  Judgement:  Good  Insight:  Good  Psychomotor Activity:   mild fine motor tremor of hands bilat  Concentration:  Concentration: Good and Attention Span: Good  Recall:  Good  Fund of Knowledge: Good  Language: Good  Assets:  Desire for Improvement  ADL's:  Intact  Cognition: WNL  Prognosis:  Good   DIAGNOSES:    ICD-10-CM   1. Major depressive disorder, recurrent episode, moderate (HCC)  F33.1     2.  Generalized anxiety disorder  F41.1     3. Tremor of both outstretched hands  R25.1     4. Mixed obsessional thoughts and acts  F42.2       Receiving Psychotherapy: Yes   with Elio Forget, [facility].  RECOMMENDATIONS:  PDMP reviewed.  Klonopin filled 10/16/2022. I provided 30  minutes of face to face time during this encounter, including time spent before and after the visit in records review, medical decision making, counseling pertinent to today's visit, and charting.   Disc going back on Risperdal. It helped w/ depression and anxiety. She's not sure it made her tremor worse anyway, and would rather have tremor than feel the way she does now. She didn't start falling until Artane was added and think that's the cause of the falls. She'd like to retry Risperdal.  Continue BuSpar 30 mg, 1 p.o. twice daily. Continue Klonopin 1 mg, 1/2  p.o. 3 times daily as needed.   Continue Prozac 40 mg, 2 p.o. daily. Continue Lamictal  200 mg po bid.  Restart Risperdal 0.5 mg, 1 qhs for 3 nights, then increase to 2 po qhs. Continue vitamin D, add multivitamin and B complex.  Fish oil also. Continue CPAP use. Continue therapy with Elio Forget, [facility]. Return in 2 weeks for prev sched appt.  Melony Overly, PA-C

## 2022-12-10 NOTE — Telephone Encounter (Signed)
Has appt with Teresa today 

## 2022-12-25 ENCOUNTER — Ambulatory Visit (INDEPENDENT_AMBULATORY_CARE_PROVIDER_SITE_OTHER): Payer: Commercial Managed Care - HMO | Admitting: Physician Assistant

## 2022-12-25 ENCOUNTER — Encounter: Payer: Self-pay | Admitting: Physician Assistant

## 2022-12-25 DIAGNOSIS — G4733 Obstructive sleep apnea (adult) (pediatric): Secondary | ICD-10-CM | POA: Diagnosis not present

## 2022-12-25 DIAGNOSIS — F422 Mixed obsessional thoughts and acts: Secondary | ICD-10-CM | POA: Diagnosis not present

## 2022-12-25 DIAGNOSIS — F411 Generalized anxiety disorder: Secondary | ICD-10-CM | POA: Diagnosis not present

## 2022-12-25 DIAGNOSIS — F3341 Major depressive disorder, recurrent, in partial remission: Secondary | ICD-10-CM

## 2022-12-25 NOTE — Progress Notes (Signed)
[facility]  Patient ID: Rebecca Roth,  MRN: [id number]  PCP: [facility]  Date of Evaluation: 12/25/2022 Time spent:20 minutes  Chief Complaint:  Chief Complaint   Anxiety; Depression; Follow-up     HISTORY/CURRENT STATUS: HPI  For routine f/u.  Was seen 2 wks ago, we restarted Risperdal. No negative effects. Unable to see any positive effects yet. No falls except 1 when she had trouble getting up from a low sitting position when cleaning the kitty litter. Doesn't feel like the fall was due to imbalance or dizziness or anything like that.  She does feel much more relaxed, her hands were not shaking like they were and she is not as anxious.  Not having panic attacks.  Klonopin is helpful when needed.  She has been sick with bronchitis over Christmas.  But she is getting better.  She is able to enjoy things.  Her sleep has been disturbed due to the illness.  It is not usually though.  Appetite is normal and weight is stable.  ADLs and personal hygiene are normal.  She is not crying easily.  No suicidal or homicidal thoughts.  Patient denies increased energy with decreased need for sleep, increased talkativeness, racing thoughts, impulsivity or risky behaviors, increased spending, increased libido, grandiosity, increased irritability or anger, paranoia, or hallucinations.  Denies dizziness, syncope, seizures, numbness, tingling, tics, unsteady gait, slurred speech, confusion. Denies muscle or joint pain, stiffness, or dystonia. Denies unexplained weight loss, frequent infections, or sores that heal slowly.  No polyphagia, polydipsia, or polyuria. Denies visual changes or paresthesias.   Individual Medical History/ Review of Systems: Changes? :Yes   has bronchitis  Past medications for mental health diagnoses include: Cymbalta, Depakote, Effexor, Celexa Wellbutrin, Abilify, Zoloft ? Lexapro?, Prozac, Buspar, Klonopin, Rexulti was effective but too  expensive. Risperdal caused falls, Artane caused Falls  Allergies: Artane [trihexyphenidyl], Risperdal [risperidone], Ace inhibitors, and Sulfa antibiotics  Current Medications:  Current Outpatient Medications:    albuterol (VENTOLIN HFA) 108 (90 Base) MCG/ACT inhaler, Inhale 2 puffs into the lungs every 6 (six) hours as needed (Asthma)., Disp: , Rfl:    aspirin EC 81 MG tablet, Take 81 mg by mouth daily. Swallow whole., Disp: , Rfl:    atorvastatin (LIPITOR) 40 MG tablet, Take 1 tablet (40 mg total) by mouth daily. (Patient taking differently: Take 40 mg by mouth at bedtime.), Disp: 30 tablet, Rfl: 2   BAYER CONTOUR TEST test strip, , Disp: , Rfl:    bumetanide (BUMEX) 1 MG tablet, Take 1 mg by mouth daily., Disp: , Rfl:    busPIRone (BUSPAR) 30 MG tablet, TAKE 1 TABLET BY MOUTH TWICE DAILY, Disp: 60 tablet, Rfl: 10   carvedilol (COREG) 6.25 MG tablet, Take 6.25 mg by mouth 2 (two) times daily., Disp: , Rfl:    clonazePAM (KLONOPIN) 1 MG tablet, Take 0.5 tablets (0.5 mg total) by mouth 3 (three) times daily as needed for anxiety., Disp: 45 tablet, Rfl: 1   etodolac (LODINE) 400 MG tablet, TAKE 1 TABLET BY MOUTH TWICE A DAY WITH FOOD (Patient taking differently: Take 200 mg by mouth 2 (two) times daily.), Disp: 60 tablet, Rfl: 2   FARXIGA 10 MG TABS tablet, Take 10 mg by mouth daily., Disp: , Rfl:    fenofibrate 54 MG tablet, TAKE 1 TABLET BY MOUTH EVERY DAY, Disp: 30 tablet, Rfl: 2   FLUoxetine (PROZAC) 40 MG capsule, TAKE TWO (2) CAPSULES BY MOUTH ONCE DAILY, Disp: 60 capsule, Rfl: 11  furosemide (LASIX) 40 MG tablet, Take 40 mg by mouth daily., Disp: , Rfl:    insulin glargine, 2 Unit Dial, (TOUJEO MAX SOLOSTAR) 300 UNIT/ML Solostar Pen, Inject 80 Units into the skin 2 (two) times daily., Disp: , Rfl:    Insulin Pen Needle (SURE COMFORT PEN NEEDLES) 31G X 5 MM MISC, Use as directed, Disp: 30 each, Rfl: 1   lamoTRIgine (LAMICTAL) 200 MG tablet, Take 1 tablet (200 mg total) by mouth 2 (two)  times daily., Disp: 60 tablet, Rfl: 11   Lancet Devices (SIMPLE DIAGNOSTICS LANCING DEV) MISC, , Disp: , Rfl:    levocetirizine (XYZAL) 5 MG tablet, Take 5 mg by mouth every evening., Disp: , Rfl:    levothyroxine (SYNTHROID) 137 MCG tablet, Take 137 mcg by mouth daily., Disp: , Rfl:    montelukast (SINGULAIR) 10 MG tablet, TAKE 1 TABLET BY MOUTH EVERY DAY (Patient taking differently: Take 10 mg by mouth daily.), Disp: 90 tablet, Rfl: 1   pantoprazole (PROTONIX) 40 MG tablet, TAKE 1 TABLET BY MOUTH TWICE A DAY (Patient taking differently: Take 40 mg by mouth 2 (two) times daily.), Disp: 60 tablet, Rfl: 2   risperiDONE (RISPERDAL) 0.5 MG tablet, 1 po qhs for 3 nights then increase to 2 po qhs. (Patient taking differently: Take 1 mg by mouth at bedtime.), Disp: 60 tablet, Rfl: 0   valsartan (DIOVAN) 80 MG tablet, Take 1 tablet (80 mg total) by mouth daily., Disp: 90 tablet, Rfl: 0   Vitamin D, Ergocalciferol, (DRISDOL) 50000 UNITS CAPS capsule, Take 50,000 Units by mouth every Tuesday. At bedtime, Disp: , Rfl:    SYNJARDY XR 25-1000 MG TB24, TAKE 1 TABLET BY MOUTH EVERY DAY IN THE MORNING (Patient not taking: Reported on 10/12/2022), Disp: 30 tablet, Rfl: 2 Medication Side Effects: none  Family Medical/ Social History: Changes?  no  MENTAL HEALTH EXAM:  There were no vitals taken for this visit.There is no height or weight on file to calculate BMI.  General Appearance: Casual, Neat, Well Groomed and Obese  Eye Contact:  Good  Speech:  Clear and Coherent and Normal Rate  Volume:  Normal  Mood:  Anxious and Depressed  Affect:  Congruent  Thought Process:  Goal Directed and Descriptions of Associations: Circumstantial  Orientation:  Full (Time, Place, and Person)  Thought Content: Logical   Suicidal Thoughts:  No  Homicidal Thoughts:  No  Memory:  WNL  Judgement:  Good  Insight:  Good  Psychomotor Activity:  Normal and mild fine motor tremor of hand  Concentration:  Concentration: Good and  Attention Span: Good  Recall:  Good  Fund of Knowledge: Good  Language: Good  Assets:  Desire for Improvement  ADL's:  Intact  Cognition: WNL  Prognosis:  Good   DIAGNOSES:    ICD-10-CM   1. Recurrent major depressive disorder, in partial remission (HCC)  F33.41     2. Mixed obsessional thoughts and acts  F42.2     3. Generalized anxiety disorder  F41.1     4. Obstructive sleep apnea  G47.33      Receiving Psychotherapy: Yes   with Elio Forget, [facility].  RECOMMENDATIONS:  PDMP reviewed.  Klonopin filled 10/16/2022. I provided 20 minutes of face to face time during this encounter, including time spent before and after the visit in records review, medical decision making, counseling pertinent to today's visit, and charting.   She seems to be doing better although it is too early to tell how well she  will respond to the Risperdal.  She will call in about 2 weeks if she is not feeling any improvement whatsoever and I will increase the dose.  Continue BuSpar 30 mg, 1 p.o. twice daily. Continue Klonopin 1 mg, 1/2 p.o. 3 times daily as needed.   Continue Prozac 40 mg, 2 p.o. daily. Continue Lamictal  200 mg po bid.  Continue Risperdal 0.5 mg, 2 po qhs. Continue vitamin D, add multivitamin and B complex.  Fish oil also. Continue CPAP use. Continue therapy with Elio Forget, [facility]. Return in 4 weeks.  Melony Overly, PA-C

## 2023-01-13 ENCOUNTER — Other Ambulatory Visit: Payer: Self-pay | Admitting: Physician Assistant

## 2023-01-22 ENCOUNTER — Ambulatory Visit (INDEPENDENT_AMBULATORY_CARE_PROVIDER_SITE_OTHER): Payer: Commercial Managed Care - HMO | Admitting: Physician Assistant

## 2023-01-22 ENCOUNTER — Encounter: Payer: Self-pay | Admitting: Physician Assistant

## 2023-01-22 DIAGNOSIS — F422 Mixed obsessional thoughts and acts: Secondary | ICD-10-CM | POA: Diagnosis not present

## 2023-01-22 DIAGNOSIS — F3341 Major depressive disorder, recurrent, in partial remission: Secondary | ICD-10-CM

## 2023-01-22 DIAGNOSIS — G4733 Obstructive sleep apnea (adult) (pediatric): Secondary | ICD-10-CM | POA: Diagnosis not present

## 2023-01-22 DIAGNOSIS — F411 Generalized anxiety disorder: Secondary | ICD-10-CM | POA: Diagnosis not present

## 2023-01-22 MED ORDER — RISPERIDONE 0.5 MG PO TABS: 0.5000 mg | ORAL_TABLET | Freq: Two times a day (BID) | ORAL | 11 refills | Status: DC

## 2023-01-22 NOTE — Progress Notes (Signed)
[facility]  Patient ID: Rebecca Roth,  MRN: [id number]  PCP: [facility]  Date of Evaluation: 01/22/2023 Time spent:20 minutes  Chief Complaint:  Chief Complaint   Anxiety; Depression; Follow-up    HISTORY/CURRENT STATUS: HPI  For routine f/u.  "I'm doing great! Even better than the last time I told you I was doing great."  She has felt like cleaning the house more.  She has gone through most of the kitchen, organizing the cabinets.  States it feels good to want to do things that need to be done.  Patient denies increased energy with decreased need for sleep, increased talkativeness, racing thoughts, impulsivity or risky behaviors, increased spending, increased libido, grandiosity, increased irritability or anger, paranoia, or hallucinations.  Patient is able to enjoy things.  Energy and motivation are good.  No extreme sadness, tearfulness, or feelings of hopelessness.  Sleeps well most of the time.  She does use her CPAP.  ADLs and personal hygiene are normal.   Denies any changes in concentration, making decisions, or remembering things.  Appetite has not changed.  Weight is stable.  Not having any anxiety to speak of.  She will occasionally take a Klonopin but does not need it very often.  She does not obsess about things like she has in the past.  Denies suicidal or homicidal thoughts.  Still has mild tremor in her hands but it is not bothering her at all.  No falls since her last visit.  Denies dizziness, syncope, seizures, numbness, tingling, tics, unsteady gait, slurred speech, confusion. Denies muscle or joint pain, stiffness, or dystonia. Denies unexplained weight loss, frequent infections, or sores that heal slowly.  No polyphagia, polydipsia, or polyuria. Denies visual changes or paresthesias.   Individual Medical History/ Review of Systems: Changes? :No    Past medications for mental health diagnoses include: Cymbalta, Depakote,  Effexor, Celexa Wellbutrin, Abilify, Zoloft ? Lexapro?, Prozac, Buspar, Klonopin, Rexulti was effective but too expensive. Risperdal caused falls, Artane caused Falls  Allergies: Artane [trihexyphenidyl], Risperdal [risperidone], Ace inhibitors, and Sulfa antibiotics  Current Medications:  Current Outpatient Medications:    albuterol (VENTOLIN HFA) 108 (90 Base) MCG/ACT inhaler, Inhale 2 puffs into the lungs every 6 (six) hours as needed (Asthma)., Disp: , Rfl:    aspirin EC 81 MG tablet, Take 81 mg by mouth daily. Swallow whole., Disp: , Rfl:    atorvastatin (LIPITOR) 40 MG tablet, Take 1 tablet (40 mg total) by mouth daily. (Patient taking differently: Take 40 mg by mouth at bedtime.), Disp: 30 tablet, Rfl: 2   BAYER CONTOUR TEST test strip, , Disp: , Rfl:    busPIRone (BUSPAR) 30 MG tablet, TAKE 1 TABLET BY MOUTH TWICE DAILY, Disp: 60 tablet, Rfl: 10   carvedilol (COREG) 6.25 MG tablet, Take 6.25 mg by mouth 2 (two) times daily., Disp: , Rfl:    clonazePAM (KLONOPIN) 1 MG tablet, Take 0.5 tablets (0.5 mg total) by mouth 3 (three) times daily as needed for anxiety., Disp: 45 tablet, Rfl: 1   etodolac (LODINE) 400 MG tablet, TAKE 1 TABLET BY MOUTH TWICE A DAY WITH FOOD (Patient taking differently: Take 200 mg by mouth 2 (two) times daily.), Disp: 60 tablet, Rfl: 2   fenofibrate 54 MG tablet, TAKE 1 TABLET BY MOUTH EVERY DAY, Disp: 30 tablet, Rfl: 2   FLUoxetine (PROZAC) 40 MG capsule, TAKE TWO (2) CAPSULES BY MOUTH ONCE DAILY, Disp: 60 capsule, Rfl: 11   furosemide (LASIX) 40 MG tablet, Take 40  mg by mouth daily., Disp: , Rfl:    insulin glargine, 2 Unit Dial, (TOUJEO MAX SOLOSTAR) 300 UNIT/ML Solostar Pen, Inject 80 Units into the skin 2 (two) times daily., Disp: , Rfl:    Insulin Pen Needle (SURE COMFORT PEN NEEDLES) 31G X 5 MM MISC, Use as directed, Disp: 30 each, Rfl: 1   lamoTRIgine (LAMICTAL) 200 MG tablet, Take 1 tablet (200 mg total) by mouth 2 (two) times daily., Disp: 60 tablet, Rfl:  11   Lancet Devices (SIMPLE DIAGNOSTICS LANCING DEV) MISC, , Disp: , Rfl:    levocetirizine (XYZAL) 5 MG tablet, Take 5 mg by mouth every evening., Disp: , Rfl:    levothyroxine (SYNTHROID) 137 MCG tablet, Take 137 mcg by mouth daily., Disp: , Rfl:    montelukast (SINGULAIR) 10 MG tablet, TAKE 1 TABLET BY MOUTH EVERY DAY (Patient taking differently: Take 10 mg by mouth daily.), Disp: 90 tablet, Rfl: 1   pantoprazole (PROTONIX) 40 MG tablet, TAKE 1 TABLET BY MOUTH TWICE A DAY (Patient taking differently: Take 40 mg by mouth 2 (two) times daily.), Disp: 60 tablet, Rfl: 2   valsartan (DIOVAN) 80 MG tablet, Take 1 tablet (80 mg total) by mouth daily., Disp: 90 tablet, Rfl: 0   Vitamin D, Ergocalciferol, (DRISDOL) 50000 UNITS CAPS capsule, Take 50,000 Units by mouth every Tuesday. At bedtime, Disp: , Rfl:    bumetanide (BUMEX) 1 MG tablet, Take 1 mg by mouth daily. (Patient not taking: Reported on 01/22/2023), Disp: , Rfl:    FARXIGA 10 MG TABS tablet, Take 10 mg by mouth daily. (Patient not taking: Reported on 01/22/2023), Disp: , Rfl:    risperiDONE (RISPERDAL) 0.5 MG tablet, Take 1 tablet (0.5 mg total) by mouth 2 (two) times daily., Disp: 60 tablet, Rfl: 11   SYNJARDY XR 25-1000 MG TB24, TAKE 1 TABLET BY MOUTH EVERY DAY IN THE MORNING (Patient not taking: Reported on 10/12/2022), Disp: 30 tablet, Rfl: 2 Medication Side Effects: none  Family Medical/ Social History: Changes?  no  MENTAL HEALTH EXAM:  There were no vitals taken for this visit.There is no height or weight on file to calculate BMI.  General Appearance: Casual, Neat, Well Groomed and Obese  Eye Contact:  Good  Speech:  Clear and Coherent and Normal Rate  Volume:  Normal  Mood:  Euthymic  Affect:  Congruent  Thought Process:  Goal Directed and Descriptions of Associations: Circumstantial  Orientation:  Full (Time, Place, and Person)  Thought Content: Logical   Suicidal Thoughts:  No  Homicidal Thoughts:  No  Memory:  WNL   Judgement:  Good  Insight:  Good  Psychomotor Activity:  Normal  Concentration:  Concentration: Good and Attention Span: Good  Recall:  Good  Fund of Knowledge: Good  Language: Good  Assets:  Desire for Improvement  ADL's:  Intact  Cognition: WNL  Prognosis:  Good   DIAGNOSES:    ICD-10-CM   1. Recurrent major depressive disorder, in partial remission ([place])  F33.41     2. Mixed obsessional thoughts and acts  F42.2     3. Generalized anxiety disorder  F41.1     4. Obstructive sleep apnea  G47.33      Receiving Psychotherapy: Yes   with Lanetta Inch, [facility].  RECOMMENDATIONS:  PDMP reviewed.  Klonopin filled 10/16/2022. I provided 20 minutes of face to face time during this encounter, including time spent before and after the visit in records review, medical decision making, counseling pertinent to today's visit,  and charting.   She is doing great so no change in treatment needed.  Continue BuSpar 30 mg, 1 p.o. twice daily. Continue Klonopin 1 mg, 1/2 p.o. 3 times daily as needed.   Continue Prozac 40 mg, 2 p.o. daily. Continue Lamictal  200 mg po bid.  Continue Risperdal 0.5 mg, 2 po qhs. Continue vitamin D, add multivitamin and B complex.  Fish oil also. Continue CPAP use. Continue therapy with Lanetta Inch, [facility]. Return in 2 months.  Donnal Moat, PA-C

## 2023-02-12 ENCOUNTER — Other Ambulatory Visit: Payer: Self-pay | Admitting: Physician Assistant

## 2023-03-09 ENCOUNTER — Other Ambulatory Visit: Payer: Self-pay | Admitting: Physician Assistant

## 2023-03-19 ENCOUNTER — Encounter: Payer: Self-pay | Admitting: Physician Assistant

## 2023-03-19 ENCOUNTER — Ambulatory Visit (INDEPENDENT_AMBULATORY_CARE_PROVIDER_SITE_OTHER): Payer: Commercial Managed Care - HMO | Admitting: Physician Assistant

## 2023-03-19 DIAGNOSIS — F3342 Major depressive disorder, recurrent, in full remission: Secondary | ICD-10-CM | POA: Diagnosis not present

## 2023-03-19 DIAGNOSIS — G4733 Obstructive sleep apnea (adult) (pediatric): Secondary | ICD-10-CM

## 2023-03-19 DIAGNOSIS — F411 Generalized anxiety disorder: Secondary | ICD-10-CM | POA: Diagnosis not present

## 2023-03-19 DIAGNOSIS — F422 Mixed obsessional thoughts and acts: Secondary | ICD-10-CM

## 2023-03-19 MED ORDER — CLONAZEPAM 1 MG PO TABS: 0.5000 mg | ORAL_TABLET | Freq: Three times a day (TID) | ORAL | 1 refills | Status: DC | PRN

## 2023-03-19 MED ORDER — BREXPIPRAZOLE 2 MG PO TABS: 2.0000 mg | ORAL_TABLET | Freq: Every day | ORAL | 1 refills | Status: DC

## 2023-03-19 MED ORDER — BUSPIRONE HCL 30 MG PO TABS: 30.0000 mg | ORAL_TABLET | Freq: Two times a day (BID) | ORAL | 5 refills | Status: DC

## 2023-03-19 NOTE — Progress Notes (Unsigned)
[facility]  Patient ID: Rebecca Roth,  MRN: [id number]  PCP: [facility]  Date of Evaluation: 03/19/2023 Time spent:20 minutes  Chief Complaint:  Chief Complaint   Anxiety; Depression; Follow-up    HISTORY/CURRENT STATUS: HPI  For routine f/u.  Never gets full. Has gained about 30# in 3 months.      Still has mild tremor in her hands but it is not bothering her at all.  No falls since her last visit.  Denies dizziness, syncope, seizures, numbness, tingling, tics, unsteady gait, slurred speech, confusion. Denies muscle or joint pain, stiffness, or dystonia. Denies unexplained weight loss, frequent infections, or sores that heal slowly.  No polyphagia, polydipsia, or polyuria. Denies visual changes or paresthesias.   Individual Medical History/ Review of Systems: Changes? :No    Past medications for mental health diagnoses include: Cymbalta, Depakote, Effexor, Celexa Wellbutrin, Abilify, Zoloft ? Lexapro?, Prozac, Buspar, Klonopin, Rexulti was effective but too expensive. Risperdal caused falls, Artane caused Falls, seroquel for sleep  Allergies: Artane [trihexyphenidyl], Risperdal [risperidone], Ace inhibitors, and Sulfa antibiotics  Current Medications:  Current Outpatient Medications:    albuterol (VENTOLIN HFA) 108 (90 Base) MCG/ACT inhaler, Inhale 2 puffs into the lungs every 6 (six) hours as needed (Asthma)., Disp: , Rfl:    aspirin EC 81 MG tablet, Take 81 mg by mouth daily. Swallow whole., Disp: , Rfl:    atorvastatin (LIPITOR) 40 MG tablet, Take 1 tablet (40 mg total) by mouth daily. (Patient taking differently: Take 40 mg by mouth at bedtime.), Disp: 30 tablet, Rfl: 2   BAYER CONTOUR TEST test strip, , Disp: , Rfl:    brexpiprazole (REXULTI) 2 MG TABS tablet, Take 1 tablet (2 mg total) by mouth daily., Disp: 30 tablet, Rfl: 1   etodolac (LODINE) 400 MG tablet, TAKE 1 TABLET BY MOUTH TWICE A DAY WITH FOOD (Patient taking  differently: Take 200 mg by mouth 2 (two) times daily.), Disp: 60 tablet, Rfl: 2   FARXIGA 10 MG TABS tablet, Take 10 mg by mouth daily., Disp: , Rfl:    fenofibrate 54 MG tablet, TAKE 1 TABLET BY MOUTH EVERY DAY, Disp: 30 tablet, Rfl: 2   FLUoxetine (PROZAC) 40 MG capsule, TAKE TWO (2) CAPSULES BY MOUTH ONCE DAILY, Disp: 60 capsule, Rfl: 11   furosemide (LASIX) 40 MG tablet, Take 40 mg by mouth daily., Disp: , Rfl:    insulin glargine, 2 Unit Dial, (TOUJEO MAX SOLOSTAR) 300 UNIT/ML Solostar Pen, Inject 80 Units into the skin 2 (two) times daily., Disp: , Rfl:    Insulin Pen Needle (SURE COMFORT PEN NEEDLES) 31G X 5 MM MISC, Use as directed, Disp: 30 each, Rfl: 1   lamoTRIgine (LAMICTAL) 200 MG tablet, Take 1 tablet (200 mg total) by mouth 2 (two) times daily., Disp: 60 tablet, Rfl: 11   Lancet Devices (SIMPLE DIAGNOSTICS LANCING DEV) MISC, , Disp: , Rfl:    levocetirizine (XYZAL) 5 MG tablet, Take 5 mg by mouth every evening., Disp: , Rfl:    levothyroxine (SYNTHROID) 137 MCG tablet, Take 137 mcg by mouth daily., Disp: , Rfl:    montelukast (SINGULAIR) 10 MG tablet, TAKE 1 TABLET BY MOUTH EVERY DAY (Patient taking differently: Take 10 mg by mouth daily.), Disp: 90 tablet, Rfl: 1   pantoprazole (PROTONIX) 40 MG tablet, TAKE 1 TABLET BY MOUTH TWICE A DAY (Patient taking differently: Take 40 mg by mouth 2 (two) times daily.), Disp: 60 tablet, Rfl: 2   valsartan (DIOVAN) 80 MG  tablet, Take 1 tablet (80 mg total) by mouth daily., Disp: 90 tablet, Rfl: 0   Vitamin D, Ergocalciferol, (DRISDOL) 50000 UNITS CAPS capsule, Take 50,000 Units by mouth every Tuesday. At bedtime, Disp: , Rfl:    bumetanide (BUMEX) 1 MG tablet, Take 1 mg by mouth daily. (Patient not taking: Reported on 01/22/2023), Disp: , Rfl:    busPIRone (BUSPAR) 30 MG tablet, Take 1 tablet (30 mg total) by mouth 2 (two) times daily., Disp: 60 tablet, Rfl: 5   carvedilol (COREG) 6.25 MG tablet, Take 6.25 mg by mouth 2 (two) times daily. (Patient  not taking: Reported on 03/19/2023), Disp: , Rfl:    clonazePAM (KLONOPIN) 1 MG tablet, Take 0.5 tablets (0.5 mg total) by mouth 3 (three) times daily as needed for anxiety., Disp: 45 tablet, Rfl: 1   SYNJARDY XR 25-1000 MG TB24, TAKE 1 TABLET BY MOUTH EVERY DAY IN THE MORNING (Patient not taking: Reported on 10/12/2022), Disp: 30 tablet, Rfl: 2 Medication Side Effects: none  Family Medical/ Social History: Changes?  no  MENTAL HEALTH EXAM:  There were no vitals taken for this visit.There is no height or weight on file to calculate BMI.  General Appearance: Casual, Neat, Well Groomed and Obese  Eye Contact:  Good  Speech:  Clear and Coherent and Normal Rate  Volume:  Normal  Mood:  Euthymic  Affect:  Congruent  Thought Process:  Goal Directed and Descriptions of Associations: Circumstantial  Orientation:  Full (Time, Place, and Person)  Thought Content: Logical   Suicidal Thoughts:  No  Homicidal Thoughts:  No  Memory:  WNL  Judgement:  Good  Insight:  Good  Psychomotor Activity:  Normal  Concentration:  Concentration: Good and Attention Span: Good  Recall:  Good  Fund of Knowledge: Good  Language: Good  Assets:  Desire for Improvement  ADL's:  Intact  Cognition: WNL  Prognosis:  Good   DIAGNOSES:  No diagnosis found.  Receiving Psychotherapy: Yes   with Lanetta Inch, [facility].  RECOMMENDATIONS:  PDMP reviewed.  Klonopin filled 10/16/2022. I provided 20 minutes of face to face time during this encounter, including time spent before and after the visit in records review, medical decision making, counseling pertinent to today's visit, and charting.    Plan B [place]  Continue BuSpar 30 mg, 1 p.o. twice daily. Continue Klonopin 1 mg, 1/2 p.o. 3 times daily as needed.   Continue Prozac 40 mg, 2 p.o. daily. Continue Lamictal  200 mg po bid.  Continue Risperdal 0.5 mg, 2 po qhs. Continue vitamin D, add multivitamin and B complex.  Fish oil also. Continue CPAP use. Continue  therapy with Lanetta Inch, [facility]. Return in 6 weeks.  On the Risperdal 0  Donnal Moat, PA-C

## 2023-03-19 NOTE — Patient Instructions (Signed)
0.5 mg start taking 1 pill only at night for 4 nights and then stop.  At the same time we will start the samples of Rexulti that I gave you at this visit.

## 2023-04-20 ENCOUNTER — Telehealth: Payer: Self-pay

## 2023-04-20 NOTE — Telephone Encounter (Signed)
Prior authorization submitted and approved for Rexulti 2 mg effective 04/20/2023-04/19/2024 with [employer], PA# [id number]

## 2023-04-23 ENCOUNTER — Encounter: Payer: Self-pay | Admitting: Physician Assistant

## 2023-04-23 ENCOUNTER — Ambulatory Visit: Payer: Commercial Managed Care - HMO | Admitting: Physician Assistant

## 2023-04-23 DIAGNOSIS — F422 Mixed obsessional thoughts and acts: Secondary | ICD-10-CM | POA: Diagnosis not present

## 2023-04-23 DIAGNOSIS — F411 Generalized anxiety disorder: Secondary | ICD-10-CM | POA: Diagnosis not present

## 2023-04-23 DIAGNOSIS — G4733 Obstructive sleep apnea (adult) (pediatric): Secondary | ICD-10-CM

## 2023-04-23 DIAGNOSIS — F331 Major depressive disorder, recurrent, moderate: Secondary | ICD-10-CM

## 2023-04-23 MED ORDER — BREXPIPRAZOLE 2 MG PO TABS: 2.0000 mg | ORAL_TABLET | Freq: Every day | ORAL | 1 refills | Status: DC

## 2023-04-23 NOTE — Progress Notes (Signed)
[facility]  Patient ID: Rebecca Roth,  MRN: [id number]  PCP: [facility]  Date of Evaluation: 04/23/2023 Time spent:20 minutes  Chief Complaint:  Chief Complaint   Anxiety; Depression; Follow-up    HISTORY/CURRENT STATUS: HPI  For routine f/u.  Was told by her insurance the Rexulti was denied, but we got notice that it was approved. She's still taking the Risperdal for now.  States she is not doing well.  Is weepy. Anhedonia, lacks energy and motivation.  Personal hygiene is normal.  ADLs normal.  Appetite is normal and weight is stable.  Sleeps fairly well.  Uses her CPAP.  Not having panic attacks but she does get generally anxious at times, overwhelmed.  Does not obsess about things like she has in the past.  Klonopin helps when needed but she does not take it often.  No suicidal or homicidal thoughts.  Patient denies increased energy with decreased need for sleep, increased talkativeness, racing thoughts, impulsivity or risky behaviors, increased spending, increased libido, grandiosity, increased irritability or anger, paranoia, or hallucinations.  Denies dizziness, syncope, seizures, numbness, tingling, tics, tremor, unsteady gait, slurred speech, confusion. Denies muscle or joint pain, stiffness, or dystonia. Denies unexplained weight loss, frequent infections, or sores that heal slowly.  No polyphagia, polydipsia, or polyuria. Denies visual changes or paresthesias.   Individual Medical History/ Review of Systems: Changes? :No    Past medications for mental health diagnoses include: Cymbalta, Depakote, Effexor, Celexa Wellbutrin, Abilify, Zoloft ? Lexapro?, Prozac, Buspar, Klonopin, Rexulti was effective but too expensive. Risperdal caused falls, Artane caused Falls, seroquel for sleep  Allergies: Artane [trihexyphenidyl], Risperdal [risperidone], Ace inhibitors, and Sulfa antibiotics  Current Medications:  Current Outpatient  Medications:    albuterol (VENTOLIN HFA) 108 (90 Base) MCG/ACT inhaler, Inhale 2 puffs into the lungs every 6 (six) hours as needed (Asthma)., Disp: , Rfl:    aspirin EC 81 MG tablet, Take 81 mg by mouth daily. Swallow whole., Disp: , Rfl:    atorvastatin (LIPITOR) 40 MG tablet, Take 1 tablet (40 mg total) by mouth daily. (Patient taking differently: Take 40 mg by mouth at bedtime.), Disp: 30 tablet, Rfl: 2   BAYER CONTOUR TEST test strip, , Disp: , Rfl:    busPIRone (BUSPAR) 30 MG tablet, Take 1 tablet (30 mg total) by mouth 2 (two) times daily., Disp: 60 tablet, Rfl: 5   clonazePAM (KLONOPIN) 1 MG tablet, Take 0.5 tablets (0.5 mg total) by mouth 3 (three) times daily as needed for anxiety., Disp: 45 tablet, Rfl: 1   FARXIGA 10 MG TABS tablet, Take 10 mg by mouth daily., Disp: , Rfl:    fenofibrate 54 MG tablet, TAKE 1 TABLET BY MOUTH EVERY DAY, Disp: 30 tablet, Rfl: 2   FLUoxetine (PROZAC) 40 MG capsule, TAKE TWO (2) CAPSULES BY MOUTH ONCE DAILY, Disp: 60 capsule, Rfl: 11   furosemide (LASIX) 40 MG tablet, Take 40 mg by mouth daily., Disp: , Rfl:    insulin glargine, 2 Unit Dial, (TOUJEO MAX SOLOSTAR) 300 UNIT/ML Solostar Pen, Inject 80 Units into the skin 2 (two) times daily., Disp: , Rfl:    Insulin Pen Needle (SURE COMFORT PEN NEEDLES) 31G X 5 MM MISC, Use as directed, Disp: 30 each, Rfl: 1   lamoTRIgine (LAMICTAL) 200 MG tablet, Take 1 tablet (200 mg total) by mouth 2 (two) times daily., Disp: 60 tablet, Rfl: 11   Lancet Devices (SIMPLE DIAGNOSTICS LANCING DEV) MISC, , Disp: , Rfl:    levocetirizine (XYZAL)  5 MG tablet, Take 5 mg by mouth every evening., Disp: , Rfl:    levothyroxine (SYNTHROID) 137 MCG tablet, Take 137 mcg by mouth daily., Disp: , Rfl:    montelukast (SINGULAIR) 10 MG tablet, TAKE 1 TABLET BY MOUTH EVERY DAY (Patient taking differently: Take 10 mg by mouth daily.), Disp: 90 tablet, Rfl: 1   pantoprazole (PROTONIX) 40 MG tablet, TAKE 1 TABLET BY MOUTH TWICE A DAY (Patient  taking differently: Take 40 mg by mouth 2 (two) times daily.), Disp: 60 tablet, Rfl: 2   valsartan (DIOVAN) 80 MG tablet, Take 1 tablet (80 mg total) by mouth daily., Disp: 90 tablet, Rfl: 0   Vitamin D, Ergocalciferol, (DRISDOL) 50000 UNITS CAPS capsule, Take 50,000 Units by mouth every Tuesday. At bedtime, Disp: , Rfl:    brexpiprazole (REXULTI) 2 MG TABS tablet, Take 1 tablet (2 mg total) by mouth daily., Disp: 90 tablet, Rfl: 1   bumetanide (BUMEX) 1 MG tablet, Take 1 mg by mouth daily. (Patient not taking: Reported on 01/22/2023), Disp: , Rfl:    carvedilol (COREG) 6.25 MG tablet, Take 6.25 mg by mouth 2 (two) times daily. (Patient not taking: Reported on 03/19/2023), Disp: , Rfl:    etodolac (LODINE) 400 MG tablet, TAKE 1 TABLET BY MOUTH TWICE A DAY WITH FOOD (Patient taking differently: Take 200 mg by mouth 2 (two) times daily.), Disp: 60 tablet, Rfl: 2   SYNJARDY XR 25-1000 MG TB24, TAKE 1 TABLET BY MOUTH EVERY DAY IN THE MORNING (Patient not taking: Reported on 10/12/2022), Disp: 30 tablet, Rfl: 2 Medication Side Effects: weight gain from Risperdal.  Family Medical/ Social History: Changes?  no  MENTAL HEALTH EXAM:  There were no vitals taken for this visit.There is no height or weight on file to calculate BMI.  General Appearance: Casual, Neat, Well Groomed and Obese  Eye Contact:  Good  Speech:  Clear and Coherent and Normal Rate  Volume:  Normal  Mood:  Euthymic  Affect:  Congruent  Thought Process:  Goal Directed and Descriptions of Associations: Circumstantial  Orientation:  Full (Time, Place, and Person)  Thought Content: Logical   Suicidal Thoughts:  No  Homicidal Thoughts:  No  Memory:  WNL  Judgement:  Good  Insight:  Good  Psychomotor Activity:  Normal  Concentration:  Concentration: Good and Attention Span: Good  Recall:  Good  Fund of Knowledge: Good  Language: Good  Assets:  Communication Skills Desire for Improvement Financial  Resources/Insurance Housing Transportation  ADL's:  Intact  Cognition: WNL  Prognosis:  Good   DIAGNOSES:    ICD-10-CM   1. Major depressive disorder, recurrent episode, moderate (HCC)  F33.1     2. Generalized anxiety disorder  F41.1     3. Mixed obsessional thoughts and acts  F42.2     4. Obstructive sleep apnea  G47.33       Receiving Psychotherapy: Yes   with Elio Forget, [facility].  RECOMMENDATIONS:  PDMP reviewed.  Klonopin filled 03/19/2023. I provided 20 minutes of face to face time during this encounter, including time spent before and after the visit in records review, medical decision making, counseling pertinent to today's visit, and charting.   She will contact pharmacy to get the Rx for Rexulti filled, it has been approved.   Restart Rexulti.  Given 1 mg samples for 1 week then 2 mg samples for 2 weeks and then get prescription.  Continue BuSpar 30 mg, 1 p.o. twice daily. Continue Klonopin 1 mg, 1/2  p.o. 3 times daily as needed.  She does not take it often at all. Continue Prozac 40 mg, 2 p.o. daily. Continue Lamictal  200 mg po bid.  Continue vitamin D, add multivitamin and B complex.  Fish oil also. Continue CPAP use. Continue therapy with Elio Forget, [facility]. Return in 4 weeks.    Melony Overly, PA-C

## 2023-04-30 ENCOUNTER — Ambulatory Visit: Payer: Commercial Managed Care - HMO | Admitting: Physician Assistant

## 2023-05-03 ENCOUNTER — Other Ambulatory Visit: Payer: Self-pay | Admitting: Physician Assistant

## 2023-05-03 ENCOUNTER — Telehealth: Payer: Self-pay

## 2023-05-03 MED ORDER — BREXPIPRAZOLE 1 MG PO TABS: 1.0000 mg | ORAL_TABLET | Freq: Every day | ORAL | 0 refills | Status: DC

## 2023-05-03 NOTE — Telephone Encounter (Signed)
LVM to [facility]. Checked CMM and nothing there. Called pharmacy and they said they are out of stock and can't tell until stock received tomorrow if it will need a PA.

## 2023-05-03 NOTE — Telephone Encounter (Signed)
The dose is probably too high.  Decrease back to Rexulti 1 mg. I sent Rx.  Will need new PA I'm sure. If she wants to come and pick up samples she is welcome to do that.

## 2023-05-04 NOTE — Telephone Encounter (Signed)
Patient notified of recommendations. She said she had enough 1 mg tabs.

## 2023-05-06 MED ORDER — BREXPIPRAZOLE 1 MG PO TABS: 1.0000 mg | ORAL_TABLET | Freq: Every day | ORAL | 0 refills | Status: DC

## 2023-05-06 NOTE — Telephone Encounter (Signed)
Pt called and wants  her rexulti  sent to [facility] mail order

## 2023-05-06 NOTE — Telephone Encounter (Signed)
Rx sent to requested pharmacy

## 2023-05-07 NOTE — Telephone Encounter (Signed)
noted 

## 2023-05-11 ENCOUNTER — Telehealth: Payer: Self-pay | Admitting: Physician Assistant

## 2023-05-11 NOTE — Telephone Encounter (Signed)
Pharmacy sent PA request for Rexulti 1.0mg 

## 2023-05-26 ENCOUNTER — Encounter: Payer: Self-pay | Admitting: Physician Assistant

## 2023-05-26 ENCOUNTER — Ambulatory Visit: Payer: Commercial Managed Care - HMO | Admitting: Physician Assistant

## 2023-05-26 DIAGNOSIS — F411 Generalized anxiety disorder: Secondary | ICD-10-CM

## 2023-05-26 DIAGNOSIS — F422 Mixed obsessional thoughts and acts: Secondary | ICD-10-CM

## 2023-05-26 DIAGNOSIS — G4733 Obstructive sleep apnea (adult) (pediatric): Secondary | ICD-10-CM | POA: Diagnosis not present

## 2023-05-26 DIAGNOSIS — F3341 Major depressive disorder, recurrent, in partial remission: Secondary | ICD-10-CM

## 2023-05-26 NOTE — Progress Notes (Signed)
[facility]  Patient ID: Rebecca Roth,  MRN: [id number]  PCP: [facility]  Date of Evaluation: 05/26/2023 Time spent:20 minutes  Chief Complaint:  Chief Complaint   Anxiety; Depression    HISTORY/CURRENT STATUS: HPI  For routine f/u.  Rebecca Roth is doing well.  We restarted Rexulti a month ago and she has responded well to it.  Her mood is good.  She is able to enjoy things.  Energy and motivation are good for her.  She is retired.  Appetite is normal and weight is stable.  ADLs and personal hygiene are normal.  No suicidal or homicidal thoughts.  She still has anxiety from time to time and takes the Klonopin on occasion.  It is not very often though and it is effective when needed.  She is sleeping well most of the time, uses CPAP.  She and her family are grieving the loss of their 16-month old cat named Mosetta Putt.  Patient denies increased energy with decreased need for sleep, increased talkativeness, racing thoughts, impulsivity or risky behaviors, increased spending, increased libido, grandiosity, increased irritability or anger, paranoia, or hallucinations.  Denies dizziness, syncope, seizures, numbness, tingling, tics, tremor, unsteady gait, slurred speech, confusion. Denies muscle or joint pain, stiffness, or dystonia. Denies unexplained weight loss, frequent infections, or sores that heal slowly.  No polyphagia, polydipsia, or polyuria. Denies visual changes or paresthesias.   Individual Medical History/ Review of Systems: Changes? :No    Past medications for mental health diagnoses include: Cymbalta, Depakote, Effexor, Celexa Wellbutrin, Abilify, Zoloft ? Lexapro?, Prozac, Buspar, Klonopin, Rexulti was effective but too expensive. Risperdal caused falls, Artane caused Falls, seroquel for sleep  Allergies: Artane [trihexyphenidyl], Risperdal [risperidone], Ace inhibitors, and Sulfa antibiotics  Current Medications:  Current Outpatient  Medications:    albuterol (VENTOLIN HFA) 108 (90 Base) MCG/ACT inhaler, Inhale 2 puffs into the lungs every 6 (six) hours as needed (Asthma)., Disp: , Rfl:    aspirin EC 81 MG tablet, Take 81 mg by mouth daily. Swallow whole., Disp: , Rfl:    atorvastatin (LIPITOR) 40 MG tablet, Take 1 tablet (40 mg total) by mouth daily. (Patient taking differently: Take 40 mg by mouth at bedtime.), Disp: 30 tablet, Rfl: 2   BAYER CONTOUR TEST test strip, , Disp: , Rfl:    brexpiprazole (REXULTI) 1 MG TABS tablet, Take 1 tablet (1 mg total) by mouth daily., Disp: 90 tablet, Rfl: 0   busPIRone (BUSPAR) 30 MG tablet, Take 1 tablet (30 mg total) by mouth 2 (two) times daily., Disp: 60 tablet, Rfl: 5   clonazePAM (KLONOPIN) 1 MG tablet, Take 0.5 tablets (0.5 mg total) by mouth 3 (three) times daily as needed for anxiety., Disp: 45 tablet, Rfl: 1   etodolac (LODINE) 400 MG tablet, TAKE 1 TABLET BY MOUTH TWICE A DAY WITH FOOD (Patient taking differently: Take 200 mg by mouth 2 (two) times daily.), Disp: 60 tablet, Rfl: 2   FARXIGA 10 MG TABS tablet, Take 10 mg by mouth daily., Disp: , Rfl:    fenofibrate 54 MG tablet, TAKE 1 TABLET BY MOUTH EVERY DAY, Disp: 30 tablet, Rfl: 2   FLUoxetine (PROZAC) 40 MG capsule, TAKE TWO (2) CAPSULES BY MOUTH ONCE DAILY, Disp: 60 capsule, Rfl: 11   furosemide (LASIX) 40 MG tablet, Take 40 mg by mouth daily., Disp: , Rfl:    insulin glargine, 2 Unit Dial, (TOUJEO MAX SOLOSTAR) 300 UNIT/ML Solostar Pen, Inject 80 Units into the skin 2 (two) times daily., Disp: ,  Rfl:    Insulin Pen Needle (SURE COMFORT PEN NEEDLES) 31G X 5 MM MISC, Use as directed, Disp: 30 each, Rfl: 1   lamoTRIgine (LAMICTAL) 200 MG tablet, Take 1 tablet (200 mg total) by mouth 2 (two) times daily., Disp: 60 tablet, Rfl: 11   Lancet Devices (SIMPLE DIAGNOSTICS LANCING DEV) MISC, , Disp: , Rfl:    levocetirizine (XYZAL) 5 MG tablet, Take 5 mg by mouth every evening., Disp: , Rfl:    levothyroxine (SYNTHROID) 137 MCG  tablet, Take 137 mcg by mouth daily., Disp: , Rfl:    pantoprazole (PROTONIX) 40 MG tablet, TAKE 1 TABLET BY MOUTH TWICE A DAY (Patient taking differently: Take 40 mg by mouth 2 (two) times daily.), Disp: 60 tablet, Rfl: 2   valsartan (DIOVAN) 80 MG tablet, Take 1 tablet (80 mg total) by mouth daily., Disp: 90 tablet, Rfl: 0   Vitamin D, Ergocalciferol, (DRISDOL) 50000 UNITS CAPS capsule, Take 50,000 Units by mouth every Tuesday. At bedtime, Disp: , Rfl:    bumetanide (BUMEX) 1 MG tablet, Take 1 mg by mouth daily. (Patient not taking: Reported on 01/22/2023), Disp: , Rfl:    carvedilol (COREG) 6.25 MG tablet, Take 6.25 mg by mouth 2 (two) times daily. (Patient not taking: Reported on 03/19/2023), Disp: , Rfl:    montelukast (SINGULAIR) 10 MG tablet, TAKE 1 TABLET BY MOUTH EVERY DAY (Patient taking differently: Take 10 mg by mouth daily.), Disp: 90 tablet, Rfl: 1   SYNJARDY XR 25-1000 MG TB24, TAKE 1 TABLET BY MOUTH EVERY DAY IN THE MORNING (Patient not taking: Reported on 10/12/2022), Disp: 30 tablet, Rfl: 2 Medication Side Effects: weight gain from Risperdal.  Family Medical/ Social History: Changes?  no  MENTAL HEALTH EXAM:  There were no vitals taken for this visit.There is no height or weight on file to calculate BMI.  General Appearance: Casual, Neat, Well Groomed and Obese  Eye Contact:  Good  Speech:  Clear and Coherent and Normal Rate  Volume:  Normal  Mood:  Euthymic  Affect:  Congruent  Thought Process:  Goal Directed and Descriptions of Associations: Circumstantial  Orientation:  Full (Time, Place, and Person)  Thought Content: Logical   Suicidal Thoughts:  No  Homicidal Thoughts:  No  Memory:  WNL  Judgement:  Good  Insight:  Good  Psychomotor Activity:  Normal  Concentration:  Concentration: Good and Attention Span: Good  Recall:  Good  Fund of Knowledge: Good  Language: Good  Assets:  Communication Skills Desire for Improvement Financial  Resources/Insurance Housing Resilience Transportation  ADL's:  Intact  Cognition: WNL  Prognosis:  Good   DIAGNOSES:    ICD-10-CM   1. Recurrent major depressive disorder, in partial remission (HCC)  F33.41     2. Mixed obsessional thoughts and acts  F42.2     3. Generalized anxiety disorder  F41.1     4. Obstructive sleep apnea  G47.33      Receiving Psychotherapy: Yes   with Elio Forget, [facility].  RECOMMENDATIONS:  PDMP reviewed.  Klonopin filled 03/19/2023. I provided 20 minutes of face to face time during this encounter, including time spent before and after the visit in records review, medical decision making, counseling pertinent to today's visit, and charting.   She is doing well being back on the Rexulti so no changes will be made.  Continue Rexulti 1 mg, 1 p.o. daily. Continue BuSpar 30 mg, 1 p.o. twice daily. Continue Klonopin 1 mg, 1/2 p.o. 3 times daily as  needed.  She does not take it often at all. Continue Prozac 40 mg, 2 p.o. daily. Continue Lamictal  200 mg po bid.  Continue vitamin D, add multivitamin and B complex.  Fish oil also. Continue CPAP use. Continue therapy with Elio Forget, [facility]. Return in 6 weeks.    Melony Overly, PA-C

## 2023-07-07 ENCOUNTER — Ambulatory Visit: Payer: Commercial Managed Care - HMO | Admitting: Physician Assistant

## 2023-08-13 ENCOUNTER — Ambulatory Visit (INDEPENDENT_AMBULATORY_CARE_PROVIDER_SITE_OTHER): Payer: Commercial Managed Care - HMO | Admitting: Physician Assistant

## 2023-08-13 ENCOUNTER — Encounter: Payer: Self-pay | Admitting: Physician Assistant

## 2023-08-13 DIAGNOSIS — F422 Mixed obsessional thoughts and acts: Secondary | ICD-10-CM | POA: Diagnosis not present

## 2023-08-13 DIAGNOSIS — F411 Generalized anxiety disorder: Secondary | ICD-10-CM

## 2023-08-13 DIAGNOSIS — F3341 Major depressive disorder, recurrent, in partial remission: Secondary | ICD-10-CM | POA: Diagnosis not present

## 2023-08-13 DIAGNOSIS — G4733 Obstructive sleep apnea (adult) (pediatric): Secondary | ICD-10-CM

## 2023-08-13 DIAGNOSIS — R251 Tremor, unspecified: Secondary | ICD-10-CM | POA: Diagnosis not present

## 2023-08-13 MED ORDER — BREXPIPRAZOLE 1 MG PO TABS: 1.0000 mg | ORAL_TABLET | Freq: Every day | ORAL | 3 refills | Status: DC

## 2023-08-13 MED ORDER — CLONAZEPAM 0.5 MG PO TABS: 0.5000 mg | ORAL_TABLET | Freq: Three times a day (TID) | ORAL | 0 refills | Status: DC | PRN

## 2023-08-13 NOTE — Progress Notes (Unsigned)
[facility]  Patient ID: Rebecca Roth,  MRN: [id number]  PCP: [facility]  Date of Evaluation: 08/13/2023 Time spent:25 minutes  Chief Complaint:  Chief Complaint   Anxiety; Depression; Insomnia; Follow-up    HISTORY/CURRENT STATUS: HPI  For routine f/u.  Having tremor in both hands. Sometimes has to use spoon to eat with, things fall off fork. She can deal with it though, feels that Rexulti and other meds are working and she doesn't want to change or decrease doses. Uses Klonopin for tremor, helpful.   Patient is able to enjoy things.  Energy and motivation are good.  No extreme sadness, tearfulness, or feelings of hopelessness.  Sleeps well most of the time. ADLs and personal hygiene are normal.   Denies any changes in concentration, making decisions, or remembering things.  Appetite has not changed.  Weight is stable.  Anxiety isn't bad, but she's using the Klonopin more for the tremor than anxiety.  Denies suicidal or homicidal thoughts.  Patient denies increased energy with decreased need for sleep, increased talkativeness, racing thoughts, impulsivity or risky behaviors, increased spending, increased libido, grandiosity, increased irritability or anger, paranoia, or hallucinations.  Denies dizziness, syncope, seizures, numbness, tingling, tics, unsteady gait, slurred speech, confusion. No recent falls.  Denies muscle or joint pain, stiffness, or dystonia. Denies unexplained weight loss, frequent infections, or sores that heal slowly.  No polyphagia, polydipsia, or polyuria. Denies visual changes or paresthesias.   Individual Medical History/ Review of Systems: Changes? :No    Past medications for mental health diagnoses include: Cymbalta, Depakote, Effexor, Celexa Wellbutrin, Abilify, Zoloft ? Lexapro?, Prozac, Buspar, Klonopin, Rexulti was effective but too expensive. Risperdal caused falls, Artane caused Falls, seroquel for  sleep  Allergies: Artane [trihexyphenidyl], Risperdal [risperidone], Ace inhibitors, and Sulfa antibiotics  Current Medications:  Current Outpatient Medications:    albuterol (VENTOLIN HFA) 108 (90 Base) MCG/ACT inhaler, Inhale 2 puffs into the lungs every 6 (six) hours as needed (Asthma)., Disp: , Rfl:    aspirin EC 81 MG tablet, Take 81 mg by mouth daily. Swallow whole., Disp: , Rfl:    atorvastatin (LIPITOR) 40 MG tablet, Take 1 tablet (40 mg total) by mouth daily. (Patient taking differently: Take 40 mg by mouth at bedtime.), Disp: 30 tablet, Rfl: 2   busPIRone (BUSPAR) 30 MG tablet, Take 1 tablet (30 mg total) by mouth 2 (two) times daily., Disp: 60 tablet, Rfl: 5   carvedilol (COREG) 6.25 MG tablet, Take 6.25 mg by mouth 2 (two) times daily., Disp: , Rfl:    clonazePAM (KLONOPIN) 0.5 MG tablet, Take 1 tablet (0.5 mg total) by mouth 3 (three) times daily as needed for anxiety., Disp: 90 tablet, Rfl: 0   etodolac (LODINE) 400 MG tablet, TAKE 1 TABLET BY MOUTH TWICE A DAY WITH FOOD (Patient taking differently: Take 200 mg by mouth 2 (two) times daily.), Disp: 60 tablet, Rfl: 2   FARXIGA 10 MG TABS tablet, Take 10 mg by mouth daily., Disp: , Rfl:    fenofibrate 54 MG tablet, TAKE 1 TABLET BY MOUTH EVERY DAY, Disp: 30 tablet, Rfl: 2   FLUoxetine (PROZAC) 40 MG capsule, TAKE TWO (2) CAPSULES BY MOUTH ONCE DAILY, Disp: 60 capsule, Rfl: 11   furosemide (LASIX) 40 MG tablet, Take 40 mg by mouth daily., Disp: , Rfl:    insulin glargine, 2 Unit Dial, (TOUJEO MAX SOLOSTAR) 300 UNIT/ML Solostar Pen, Inject 80 Units into the skin 2 (two) times daily., Disp: , Rfl:  Insulin Pen Needle (SURE COMFORT PEN NEEDLES) 31G X 5 MM MISC, Use as directed, Disp: 30 each, Rfl: 1   lamoTRIgine (LAMICTAL) 200 MG tablet, Take 1 tablet (200 mg total) by mouth 2 (two) times daily., Disp: 60 tablet, Rfl: 11   Lancet Devices (SIMPLE DIAGNOSTICS LANCING DEV) MISC, , Disp: , Rfl:    levocetirizine (XYZAL) 5 MG tablet, Take  5 mg by mouth every evening., Disp: , Rfl:    levothyroxine (SYNTHROID) 137 MCG tablet, Take 137 mcg by mouth daily., Disp: , Rfl:    montelukast (SINGULAIR) 10 MG tablet, TAKE 1 TABLET BY MOUTH EVERY DAY (Patient taking differently: Take 10 mg by mouth daily.), Disp: 90 tablet, Rfl: 1   pantoprazole (PROTONIX) 40 MG tablet, TAKE 1 TABLET BY MOUTH TWICE A DAY (Patient taking differently: Take 40 mg by mouth 2 (two) times daily.), Disp: 60 tablet, Rfl: 2   spironolactone (ALDACTONE) 25 MG tablet, Take 25 mg by mouth daily., Disp: , Rfl:    valsartan (DIOVAN) 80 MG tablet, Take 1 tablet (80 mg total) by mouth daily., Disp: 90 tablet, Rfl: 0   Vitamin D, Ergocalciferol, (DRISDOL) 50000 UNITS CAPS capsule, Take 50,000 Units by mouth every Tuesday. At bedtime, Disp: , Rfl:    BAYER CONTOUR TEST test strip, , Disp: , Rfl:    brexpiprazole (REXULTI) 1 MG TABS tablet, Take 1 tablet (1 mg total) by mouth daily., Disp: 90 tablet, Rfl: 3   bumetanide (BUMEX) 1 MG tablet, Take 1 mg by mouth daily. (Patient not taking: Reported on 01/22/2023), Disp: , Rfl:    SYNJARDY XR 25-1000 MG TB24, TAKE 1 TABLET BY MOUTH EVERY DAY IN THE MORNING (Patient not taking: Reported on 10/12/2022), Disp: 30 tablet, Rfl: 2 Medication Side Effects: weight gain from Risperdal.  Family Medical/ Social History: Changes?  Her cat died, has been sad.  MENTAL HEALTH EXAM:  There were no vitals taken for this visit.There is no height or weight on file to calculate BMI.  General Appearance: Casual, Neat, Well Groomed and Obese  Eye Contact:  Good  Speech:  Clear and Coherent and Normal Rate  Volume:  Normal  Mood:  Euthymic  Affect:  Congruent  Thought Process:  Goal Directed and Descriptions of Associations: Circumstantial  Orientation:  Full (Time, Place, and Person)  Thought Content: Logical   Suicidal Thoughts:  No  Homicidal Thoughts:  No  Memory:  WNL  Judgement:  Good  Insight:  Good  Psychomotor Activity:   tremor  bilat outstretched hands  Concentration:  Concentration: Good and Attention Span: Good  Recall:  Good  Fund of Knowledge: Good  Language: Good  Assets:  Communication Skills Desire for Improvement Financial Resources/Insurance Housing Resilience Transportation  ADL's:  Intact  Cognition: WNL  Prognosis:  Good   DIAGNOSES:    ICD-10-CM   1. Generalized anxiety disorder  F41.1     2. Recurrent major depressive disorder, in partial remission (HCC)  F33.41     3. Tremor of both outstretched hands  R25.1     4. Mixed obsessional thoughts and acts  F42.2     5. Obstructive sleep apnea  G47.33       Receiving Psychotherapy: Yes   with Elio Forget, [facility].  RECOMMENDATIONS:  PDMP reviewed.  Klonopin filled 07/26/2023. I provided 25 minutes of face to face time during this encounter, including time spent before and after the visit in records review, medical decision making, counseling pertinent to today's visit, and charting.  Discussed the tremor. Klonopin is helping so cont for now. If it worsens or BZ doesn't help, may need to try Cogentin. Artane caused falls in the past.   Continue Rexulti 1 mg, 1 p.o. daily. Continue BuSpar 30 mg, 1 p.o. twice daily. Cont Klonopin 0.5 mg, 1 po tid prn. (Dose will be lowered so she won't have to break tabs in 1/2, which she has been doing, but she can take it more frequently prn.) Continue Prozac 40 mg, 2 p.o. daily. Continue Lamictal  200 mg po bid.  Continue vitamin D, add multivitamin and B complex. Fish oil also. Continue CPAP use. Continue therapy with Elio Forget, [facility]. Return in 4 weeks.    Melony Overly, PA-C

## 2023-09-10 ENCOUNTER — Ambulatory Visit: Payer: Commercial Managed Care - HMO | Admitting: Mental Health

## 2023-09-17 ENCOUNTER — Encounter: Payer: Self-pay | Admitting: Physician Assistant

## 2023-09-17 ENCOUNTER — Ambulatory Visit: Payer: Commercial Managed Care - HMO | Admitting: Mental Health

## 2023-09-17 ENCOUNTER — Ambulatory Visit: Payer: Commercial Managed Care - HMO | Admitting: Physician Assistant

## 2023-09-17 DIAGNOSIS — F411 Generalized anxiety disorder: Secondary | ICD-10-CM | POA: Diagnosis not present

## 2023-09-17 DIAGNOSIS — F422 Mixed obsessional thoughts and acts: Secondary | ICD-10-CM | POA: Diagnosis not present

## 2023-09-17 DIAGNOSIS — R251 Tremor, unspecified: Secondary | ICD-10-CM

## 2023-09-17 DIAGNOSIS — F3341 Major depressive disorder, recurrent, in partial remission: Secondary | ICD-10-CM

## 2023-09-17 DIAGNOSIS — G4733 Obstructive sleep apnea (adult) (pediatric): Secondary | ICD-10-CM

## 2023-09-17 MED ORDER — BUSPIRONE HCL 30 MG PO TABS: 30.0000 mg | ORAL_TABLET | Freq: Two times a day (BID) | ORAL | 5 refills | Status: DC

## 2023-09-17 NOTE — Progress Notes (Unsigned)
[facility]  Patient ID: Rebecca Roth,  MRN: [id number]  PCP: Pcp, No  Date of Evaluation: 09/17/2023 Time spent:25 minutes  Chief Complaint:  Chief Complaint   Depression; Anxiety; Follow-up    HISTORY/CURRENT STATUS: HPI  For routine f/u.  Tremor is not as bad.     Patient denies increased energy with decreased need for sleep, increased talkativeness, racing thoughts, impulsivity or risky behaviors, increased spending, increased libido, grandiosity, increased irritability or anger, paranoia, or hallucinations.  Denies dizziness, syncope, seizures, numbness, tingling, tics, unsteady gait, slurred speech, confusion. No recent falls.  Denies muscle or joint pain, stiffness, or dystonia. Denies unexplained weight loss, frequent infections, or sores that heal slowly.  No polyphagia, polydipsia, or polyuria. Denies visual changes or paresthesias.   Individual Medical History/ Review of Systems: Changes? :No    Past medications for mental health diagnoses include: Cymbalta, Depakote, Effexor, Celexa Wellbutrin, Abilify, Zoloft ? Lexapro?, Prozac, Buspar, Klonopin, Rexulti was effective but too expensive. Risperdal caused falls, Artane caused Falls, seroquel for sleep  Allergies: Artane [trihexyphenidyl], Risperdal [risperidone], Ace inhibitors, and Sulfa antibiotics  Current Medications:  Current Outpatient Medications:    albuterol (VENTOLIN HFA) 108 (90 Base) MCG/ACT inhaler, Inhale 2 puffs into the lungs every 6 (six) hours as needed (Asthma)., Disp: , Rfl:    aspirin EC 81 MG tablet, Take 81 mg by mouth daily. Swallow whole., Disp: , Rfl:    atorvastatin (LIPITOR) 40 MG tablet, Take 1 tablet (40 mg total) by mouth daily. (Patient taking differently: Take 40 mg by mouth at bedtime.), Disp: 30 tablet, Rfl: 2   brexpiprazole (REXULTI) 1 MG TABS tablet, Take 1 tablet (1 mg total) by mouth daily., Disp: 90 tablet, Rfl: 3   carvedilol (COREG) 6.25 MG tablet,  Take 6.25 mg by mouth 2 (two) times daily., Disp: , Rfl:    clonazePAM (KLONOPIN) 0.5 MG tablet, Take 1 tablet (0.5 mg total) by mouth 3 (three) times daily as needed for anxiety., Disp: 90 tablet, Rfl: 0   FARXIGA 10 MG TABS tablet, Take 10 mg by mouth daily., Disp: , Rfl:    fenofibrate 54 MG tablet, TAKE 1 TABLET BY MOUTH EVERY DAY, Disp: 30 tablet, Rfl: 2   FLUoxetine (PROZAC) 40 MG capsule, TAKE TWO (2) CAPSULES BY MOUTH ONCE DAILY, Disp: 60 capsule, Rfl: 11   furosemide (LASIX) 40 MG tablet, Take 40 mg by mouth daily., Disp: , Rfl:    insulin glargine, 2 Unit Dial, (TOUJEO MAX SOLOSTAR) 300 UNIT/ML Solostar Pen, Inject 80 Units into the skin 2 (two) times daily., Disp: , Rfl:    Insulin Pen Needle (SURE COMFORT PEN NEEDLES) 31G X 5 MM MISC, Use as directed, Disp: 30 each, Rfl: 1   lamoTRIgine (LAMICTAL) 200 MG tablet, Take 1 tablet (200 mg total) by mouth 2 (two) times daily., Disp: 60 tablet, Rfl: 11   levocetirizine (XYZAL) 5 MG tablet, Take 5 mg by mouth every evening., Disp: , Rfl:    levothyroxine (SYNTHROID) 137 MCG tablet, Take 137 mcg by mouth daily., Disp: , Rfl:    montelukast (SINGULAIR) 10 MG tablet, TAKE 1 TABLET BY MOUTH EVERY DAY, Disp: 90 tablet, Rfl: 1   pantoprazole (PROTONIX) 40 MG tablet, TAKE 1 TABLET BY MOUTH TWICE A DAY (Patient taking differently: Take 40 mg by mouth 2 (two) times daily.), Disp: 60 tablet, Rfl: 2   spironolactone (ALDACTONE) 25 MG tablet, Take 25 mg by mouth daily., Disp: , Rfl:    valsartan (DIOVAN) 80 MG  tablet, Take 1 tablet (80 mg total) by mouth daily., Disp: 90 tablet, Rfl: 0   Vitamin D, Ergocalciferol, (DRISDOL) 50000 UNITS CAPS capsule, Take 50,000 Units by mouth every Tuesday. At bedtime, Disp: , Rfl:    BAYER CONTOUR TEST test strip, , Disp: , Rfl:    bumetanide (BUMEX) 1 MG tablet, Take 1 mg by mouth daily. (Patient not taking: Reported on 01/22/2023), Disp: , Rfl:    busPIRone (BUSPAR) 30 MG tablet, Take 1 tablet (30 mg total) by mouth 2  (two) times daily., Disp: 60 tablet, Rfl: 5   etodolac (LODINE) 400 MG tablet, TAKE 1 TABLET BY MOUTH TWICE A DAY WITH FOOD (Patient taking differently: Take 200 mg by mouth 2 (two) times daily.), Disp: 60 tablet, Rfl: 2   Lancet Devices (SIMPLE DIAGNOSTICS LANCING DEV) MISC, , Disp: , Rfl:    SYNJARDY XR 25-1000 MG TB24, TAKE 1 TABLET BY MOUTH EVERY DAY IN THE MORNING (Patient not taking: Reported on 10/12/2022), Disp: 30 tablet, Rfl: 2 Medication Side Effects: weight gain from Risperdal.  Family Medical/ Social History: Changes?    none  MENTAL HEALTH EXAM:  There were no vitals taken for this visit.There is no height or weight on file to calculate BMI.  General Appearance: Casual, Neat, Well Groomed and Obese  Eye Contact:  Good  Speech:  Clear and Coherent and Normal Rate  Volume:  Normal  Mood:  Euthymic  Affect:  Congruent  Thought Process:  Goal Directed and Descriptions of Associations: Circumstantial  Orientation:  Full (Time, Place, and Person)  Thought Content: Logical   Suicidal Thoughts:  No  Homicidal Thoughts:  No  Memory:  WNL  Judgement:  Good  Insight:  Good  Psychomotor Activity:   tremor bilat outstretched hands  Concentration:  Concentration: Good and Attention Span: Good  Recall:  Good  Fund of Knowledge: Good  Language: Good  Assets:  Communication Skills Desire for Improvement Financial Resources/Insurance Housing Resilience Transportation  ADL's:  Intact  Cognition: WNL  Prognosis:  Good   DIAGNOSES:    ICD-10-CM   1. Generalized anxiety disorder  F41.1     2. Recurrent major depressive disorder, in partial remission (HCC)  F33.41     3. Tremor of both outstretched hands  R25.1     4. Mixed obsessional thoughts and acts  F42.2     5. Obstructive sleep apnea  G47.33       Receiving Psychotherapy: Yes   with Elio Forget, [facility].  RECOMMENDATIONS:  PDMP reviewed.  Klonopin filled 08/13/2023.   Dec klon to bid x 1 wk then 1 every day   Consider xanax 0.25 mg  Continue Rexulti 1 mg, 1 p.o. daily. Continue BuSpar 30 mg, 1 p.o. twice daily. Cont Klonopin 0.5 mg, 1 po tid prn. (Dose will be lowered so she won't have to break tabs in 1/2, which she has been doing, but she can take it more frequently prn.) Continue Prozac 40 mg, 2 p.o. daily. Continue Lamictal  200 mg po bid.  Continue vitamin D, add multivitamin and B complex. Fish oil also. Continue CPAP use. Continue therapy with Elio Forget, [facility]. Return in 4-6 weeks.    Melony Overly, PA-C

## 2023-09-17 NOTE — Progress Notes (Unsigned)
[facility] Counselor/Therapist Progress Note     Patient ID: Rebecca Roth, MRN: [id number]   Date: 09/17/23   Timespent: 51 minutes   Treatment Type: Individual   Mental Status Exam:        Appearance:    Casual     Behavior:   Appropriate  Motor:   Normal  Speech/Language:    Normal Rate  Affect:   Full Range  Mood:   Euthymic  Thought process:   Coherent and Relevant  Thought content:     Logical  Perceptual disturbances:     Normal  Orientation:   Full (Time, Place, and Person)  Attention:   Good  Concentration:   good  Memory:    Intact  Fund of knowledge:    Good  Insight:     Good  Judgment:    Good  Impulse Control:   good     Reported Symptoms:  anxiety,  deficits with attention and concentration, history of panic attacks   Risk Assessment: Danger to Self:  No Self-injurious Behavior: No Danger to Others: No Duty to Warn:no Physical Aggression / Violence:No  Access to Firearms a concern: No  Gang Involvement:No  Patient / guardian was educated about steps to take if suicide or homicide risk level increases between visits. While future psychiatric events cannot be accurately predicted, the patient does not currently require acute inpatient psychiatric care and does not currently meet [facility] involuntary commitment criteria.   Subjective: Patient shared progress since last visit.  Patient shared emotional challenges related to family issues.  She stated that she had an issue with her son-in-law.  She stated they continue to reside in a residence where both she and her husband live with their daughter and son-in-law.  She shared details related to his not picking up items after returning to the home after a vacation.  She stated these times remained in her and her husband's segment of the home for several weeks.  She eventually move the items herself, after repeated requests.  She shared how this is somewhat typical based on their history, his  not following through with requests.  Through guided discovery, patient was able to notice and validate her ability to be assertive and utilize effective communication, given the strain that can be in the relationship with her son-in-law.  She identified the need to be supportive of her daughter while also maintaining some sense of boundaries in the relationship.  Ways she plans to continue to communicate effectively while also acknowledging her own feelings and utilizing her support system which primarily consists of her husband.   Interventions:CBT, Solution Focused and Strength-based   Diagnosis:     ICD-10-CM    1. Recurrent major depressive disorder, in full remission (HCC)  F33.42                 Plan:  1.  Patient to continue to engage in individual counseling 2-4 times a month or as needed. 2.  Patient to identify and apply CBT, coping skills learned in session to decrease depression and anxiety symptoms. 3.  Patient to improve effective communication and assertiveness with others. 4.  Patient to decrease obsessive compulsive behaviors with systematic desensitization as discussed in session. 5.  Patient to contact this office, go to the local ED or call 911 if a crisis or emergency develops between visits.   Waldron Session, [facility]

## 2023-09-27 ENCOUNTER — Telehealth: Payer: Self-pay | Admitting: Physician Assistant

## 2023-09-27 NOTE — Telephone Encounter (Signed)
PA approved 4/24 - effective thru 4/25. Tried [employer] and got routed round and round with no useful information. Will try again.

## 2023-09-27 NOTE — Telephone Encounter (Signed)
Rebecca Roth called and said that her insurance will no longer cover her Rexulti. She is not sure what happened.  It has been covered in the past.  May need a new PA.  If it can't get covered, she wants to know if there is another option.  Appt 10/25

## 2023-09-28 NOTE — Telephone Encounter (Signed)
Pt has previously been approved for Rexulti 3 mg and 2 mg, back in 2019 approved for 1 mg.   Pt's current dose is now back to 1 mg Rexulti according to notes, will submit a NEW PA for authorization with [employer]

## 2023-09-28 NOTE — Telephone Encounter (Signed)
Noted  

## 2023-09-28 NOTE — Telephone Encounter (Signed)
Patient notified that 1 mg needs a PA and that is being worked on now.

## 2023-10-03 ENCOUNTER — Telehealth: Payer: Self-pay

## 2023-10-03 NOTE — Telephone Encounter (Signed)
PA has been approved, see other phone message

## 2023-10-03 NOTE — Telephone Encounter (Signed)
Prior Authorization initiated with [employer] for Rexulti 1 mg #90/90 day, approval received effective through 10/02/2024, PA# [id number]

## 2023-10-04 NOTE — Telephone Encounter (Signed)
See other phone message, this was approved

## 2023-10-12 ENCOUNTER — Other Ambulatory Visit: Payer: Self-pay | Admitting: Physician Assistant

## 2023-10-22 ENCOUNTER — Ambulatory Visit: Payer: Commercial Managed Care - HMO | Admitting: Physician Assistant

## 2023-10-27 ENCOUNTER — Encounter: Payer: Self-pay | Admitting: Physician Assistant

## 2023-10-27 ENCOUNTER — Ambulatory Visit (INDEPENDENT_AMBULATORY_CARE_PROVIDER_SITE_OTHER): Payer: Commercial Managed Care - HMO | Admitting: Physician Assistant

## 2023-10-27 DIAGNOSIS — F422 Mixed obsessional thoughts and acts: Secondary | ICD-10-CM | POA: Diagnosis not present

## 2023-10-27 DIAGNOSIS — F411 Generalized anxiety disorder: Secondary | ICD-10-CM | POA: Diagnosis not present

## 2023-10-27 DIAGNOSIS — F3341 Major depressive disorder, recurrent, in partial remission: Secondary | ICD-10-CM

## 2023-10-27 DIAGNOSIS — G4733 Obstructive sleep apnea (adult) (pediatric): Secondary | ICD-10-CM

## 2023-10-27 MED ORDER — BREXPIPRAZOLE 1 MG PO TABS: 1.0000 mg | ORAL_TABLET | Freq: Every morning | ORAL | 3 refills | Status: DC

## 2023-10-27 MED ORDER — FLUOXETINE HCL 40 MG PO CAPS: ORAL_CAPSULE | ORAL | 3 refills | Status: DC

## 2023-10-27 NOTE — Progress Notes (Signed)
[facility]  Patient ID: Rebecca Roth,  MRN: [id number]  PCP: Pcp, No  Date of Evaluation: 10/27/2023 Time spent:20 minutes  Chief Complaint:  Chief Complaint   Anxiety; Depression; Follow-up    HISTORY/CURRENT STATUS: HPI  For routine f/u.  Doing well overall.  We decreased the dose of Klonopin at the last visit and she has tolerated it very well.  She is still taking 1 pill daily.  Not having panic attacks but she does get overwhelmed easily.  She has been under stress with family issues but they have improved.  She feels like her medications are working well.  She still has trouble with the OCD and counting things but that is treated "well enough" with her current medications and she does not want to change anything.  Patient is able to enjoy things.  Energy and motivation are good.  No extreme sadness, tearfulness, or feelings of hopelessness.  Sleeps well most of the time. ADLs and personal hygiene are normal.  Her memory is still not great but not really any different than it has been.  Appetite has not changed.  Weight is stable.  Denies suicidal or homicidal thoughts.  Patient denies increased energy with decreased need for sleep, increased talkativeness, racing thoughts, impulsivity or risky behaviors, increased spending, increased libido, grandiosity, increased irritability or anger, paranoia, or hallucinations.  Denies dizziness, syncope, seizures, numbness, tingling, tics, unsteady gait, slurred speech, confusion. No recent falls.  Denies muscle or joint pain, stiffness, or dystonia. Denies unexplained weight loss, frequent infections, or sores that heal slowly.  No polyphagia, polydipsia, or polyuria. Denies visual changes or paresthesias.   Individual Medical History/ Review of Systems: Changes? :No    Past medications for mental health diagnoses include: Cymbalta, Depakote, Effexor, Celexa Wellbutrin, Abilify, Zoloft ? Lexapro?, Prozac, Buspar,  Klonopin, Rexulti was effective but too expensive. Risperdal caused falls, Artane caused Falls, seroquel for sleep  Allergies: Artane [trihexyphenidyl], Risperdal [risperidone], Ace inhibitors, and Sulfa antibiotics  Current Medications:  Current Outpatient Medications:    albuterol (VENTOLIN HFA) 108 (90 Base) MCG/ACT inhaler, Inhale 2 puffs into the lungs every 6 (six) hours as needed (Asthma)., Disp: , Rfl:    aspirin EC 81 MG tablet, Take 81 mg by mouth daily. Swallow whole., Disp: , Rfl:    atorvastatin (LIPITOR) 40 MG tablet, Take 1 tablet (40 mg total) by mouth daily. (Patient taking differently: Take 40 mg by mouth at bedtime.), Disp: 30 tablet, Rfl: 2   brexpiprazole (REXULTI) 1 MG TABS tablet, Take 1 tablet (1 mg total) by mouth in the morning., Disp: 90 tablet, Rfl: 3   busPIRone (BUSPAR) 30 MG tablet, Take 1 tablet (30 mg total) by mouth 2 (two) times daily., Disp: 60 tablet, Rfl: 5   carvedilol (COREG) 6.25 MG tablet, Take 6.25 mg by mouth 2 (two) times daily., Disp: , Rfl:    clonazePAM (KLONOPIN) 0.5 MG tablet, Take 1 tablet (0.5 mg total) by mouth 3 (three) times daily as needed for anxiety., Disp: 90 tablet, Rfl: 0   etodolac (LODINE) 400 MG tablet, TAKE 1 TABLET BY MOUTH TWICE A DAY WITH FOOD (Patient taking differently: Take 200 mg by mouth 2 (two) times daily.), Disp: 60 tablet, Rfl: 2   FARXIGA 10 MG TABS tablet, Take 10 mg by mouth daily., Disp: , Rfl:    fenofibrate 54 MG tablet, TAKE 1 TABLET BY MOUTH EVERY DAY, Disp: 30 tablet, Rfl: 2   furosemide (LASIX) 40 MG tablet, Take 40 mg by  mouth daily., Disp: , Rfl:    insulin glargine, 2 Unit Dial, (TOUJEO MAX SOLOSTAR) 300 UNIT/ML Solostar Pen, Inject 80 Units into the skin 2 (two) times daily., Disp: , Rfl:    Insulin Pen Needle (SURE COMFORT PEN NEEDLES) 31G X 5 MM MISC, Use as directed, Disp: 30 each, Rfl: 1   lamoTRIgine (LAMICTAL) 200 MG tablet, Take 1 tablet (200 mg total) by mouth 2 (two) times daily., Disp: 180 tablet,  Rfl: 0   levocetirizine (XYZAL) 5 MG tablet, Take 5 mg by mouth every evening., Disp: , Rfl:    levothyroxine (SYNTHROID) 137 MCG tablet, Take 137 mcg by mouth daily., Disp: , Rfl:    montelukast (SINGULAIR) 10 MG tablet, TAKE 1 TABLET BY MOUTH EVERY DAY, Disp: 90 tablet, Rfl: 1   pantoprazole (PROTONIX) 40 MG tablet, TAKE 1 TABLET BY MOUTH TWICE A DAY (Patient taking differently: Take 40 mg by mouth 2 (two) times daily.), Disp: 60 tablet, Rfl: 2   spironolactone (ALDACTONE) 25 MG tablet, Take 25 mg by mouth daily., Disp: , Rfl:    valsartan (DIOVAN) 80 MG tablet, Take 1 tablet (80 mg total) by mouth daily., Disp: 90 tablet, Rfl: 0   Vitamin D, Ergocalciferol, (DRISDOL) 50000 UNITS CAPS capsule, Take 50,000 Units by mouth every Tuesday. At bedtime, Disp: , Rfl:    BAYER CONTOUR TEST test strip, , Disp: , Rfl:    bumetanide (BUMEX) 1 MG tablet, Take 1 mg by mouth daily. (Patient not taking: Reported on 01/22/2023), Disp: , Rfl:    FLUoxetine (PROZAC) 40 MG capsule, TAKE TWO (2) CAPSULES BY MOUTH ONCE DAILY, Disp: 180 capsule, Rfl: 3   Lancet Devices (SIMPLE DIAGNOSTICS LANCING DEV) MISC, , Disp: , Rfl:    SYNJARDY XR 25-1000 MG TB24, TAKE 1 TABLET BY MOUTH EVERY DAY IN THE MORNING (Patient not taking: Reported on 10/12/2022), Disp: 30 tablet, Rfl: 2 Medication Side Effects: weight gain and tremor  Family Medical/ Social History: Changes?    none  MENTAL HEALTH EXAM:  There were no vitals taken for this visit.There is no height or weight on file to calculate BMI.  General Appearance: Casual, Neat, Well Groomed and Obese  Eye Contact:  Good  Speech:  Clear and Coherent and Normal Rate  Volume:  Normal  Mood:  Euthymic  Affect:  Congruent  Thought Process:  Goal Directed and Descriptions of Associations: Circumstantial  Orientation:  Full (Time, Place, and Person)  Thought Content: Logical   Suicidal Thoughts:  No  Homicidal Thoughts:  No  Memory:  WNL  Judgement:  Good  Insight:  Good   Psychomotor Activity:  Normal  Concentration:  Concentration: Good and Attention Span: Good  Recall:  Good  Fund of Knowledge: Good  Language: Good  Assets:  Communication Skills Desire for Improvement Financial Resources/Insurance Housing Resilience Transportation  ADL's:  Intact  Cognition: WNL  Prognosis:  Good   DIAGNOSES:    ICD-10-CM   1. Generalized anxiety disorder  F41.1     2. Mixed obsessional thoughts and acts  F42.2     3. Recurrent major depressive disorder, in partial remission (HCC)  F33.41     4. Obstructive sleep apnea  G47.33      Receiving Psychotherapy: Yes   with Elio Forget, [facility].  RECOMMENDATIONS:  PDMP reviewed.  Klonopin filled 08/13/2023. I provided 20 minutes of face to face time during this encounter, including time spent before and after the visit in records review, medical decision making, counseling pertinent  to today's visit, and charting.   Doing well so no changes in treatment.   Continue Rexulti 1 mg, 1 p.o. daily. Continue BuSpar 30 mg, 1 p.o. twice daily. Continue klonopin 0.5 mg, 1 every day as needed. Continue Prozac 40 mg, 2 p.o. daily. Continue Lamictal  200 mg po bid.  Continue vitamin D, add multivitamin and B complex. Fish oil also. Continue CPAP use. Continue therapy with Elio Forget, [facility]. Return in 6 weeks.    Melony Overly, PA-C

## 2023-11-05 ENCOUNTER — Ambulatory Visit (INDEPENDENT_AMBULATORY_CARE_PROVIDER_SITE_OTHER): Payer: 59 | Admitting: Physician Assistant

## 2023-11-05 ENCOUNTER — Encounter: Payer: Self-pay | Admitting: Physician Assistant

## 2023-11-05 DIAGNOSIS — F422 Mixed obsessional thoughts and acts: Secondary | ICD-10-CM | POA: Diagnosis not present

## 2023-11-05 DIAGNOSIS — F411 Generalized anxiety disorder: Secondary | ICD-10-CM

## 2023-11-05 DIAGNOSIS — G4733 Obstructive sleep apnea (adult) (pediatric): Secondary | ICD-10-CM

## 2023-11-05 DIAGNOSIS — F331 Major depressive disorder, recurrent, moderate: Secondary | ICD-10-CM | POA: Diagnosis not present

## 2023-11-05 DIAGNOSIS — R251 Tremor, unspecified: Secondary | ICD-10-CM

## 2023-11-05 MED ORDER — FLUVOXAMINE MALEATE 100 MG PO TABS
ORAL_TABLET | ORAL | 1 refills | Status: DC
Start: 2023-11-05 — End: 2024-01-24

## 2023-11-05 NOTE — Patient Instructions (Addendum)
On the Luvox 100 mg. (fluvaxamine) take 1/2 pill at bedtime for 1 week, then increase to 1 at bedtime.  Stop the Prozac

## 2023-11-05 NOTE — Progress Notes (Unsigned)
[facility]  Patient ID: Rebecca Roth,  MRN: [id number]  PCP: Pcp, No  Date of Evaluation: 11/05/2023 Time spent:20 minutes  Chief Complaint:  Chief Complaint   Anxiety; Depression; Follow-up    HISTORY/CURRENT STATUS: HPI  Not doing well  Was seen last week, when she reported doing well. Now states she's been more anxious and depressed for 'awhile.' Has been very stressed with  OCD sx    Denies dizziness, syncope, seizures, numbness, tingling, tics, unsteady gait, slurred speech, confusion. No recent falls.  Denies muscle or joint pain, stiffness, or dystonia. Denies unexplained weight loss, frequent infections, or sores that heal slowly.  No polyphagia, polydipsia, or polyuria. Denies visual changes or paresthesias.   Individual Medical History/ Review of Systems: Changes? :No    Past medications for mental health diagnoses include: Cymbalta, Depakote, Effexor, Celexa Wellbutrin, Abilify, Zoloft ? Lexapro?, Prozac, Buspar, Klonopin, Rexulti was effective but too expensive. Risperdal caused falls, Artane caused Falls, seroquel for sleep  Allergies: Artane [trihexyphenidyl], Risperdal [risperidone], Ace inhibitors, and Sulfa antibiotics  Current Medications:  Current Outpatient Medications:    albuterol (VENTOLIN HFA) 108 (90 Base) MCG/ACT inhaler, Inhale 2 puffs into the lungs every 6 (six) hours as needed (Asthma)., Disp: , Rfl:    aspirin EC 81 MG tablet, Take 81 mg by mouth daily. Swallow whole., Disp: , Rfl:    atorvastatin (LIPITOR) 40 MG tablet, Take 1 tablet (40 mg total) by mouth daily. (Patient taking differently: Take 40 mg by mouth at bedtime.), Disp: 30 tablet, Rfl: 2   brexpiprazole (REXULTI) 1 MG TABS tablet, Take 1 tablet (1 mg total) by mouth in the morning., Disp: 90 tablet, Rfl: 3   busPIRone (BUSPAR) 30 MG tablet, Take 1 tablet (30 mg total) by mouth 2 (two) times daily., Disp: 60 tablet, Rfl: 5   carvedilol (COREG) 6.25 MG tablet,  Take 6.25 mg by mouth 2 (two) times daily., Disp: , Rfl:    clonazePAM (KLONOPIN) 0.5 MG tablet, Take 1 tablet (0.5 mg total) by mouth 3 (three) times daily as needed for anxiety., Disp: 90 tablet, Rfl: 0   etodolac (LODINE) 400 MG tablet, TAKE 1 TABLET BY MOUTH TWICE A DAY WITH FOOD (Patient taking differently: Take 200 mg by mouth 2 (two) times daily.), Disp: 60 tablet, Rfl: 2   FARXIGA 10 MG TABS tablet, Take 10 mg by mouth daily., Disp: , Rfl:    fenofibrate 54 MG tablet, TAKE 1 TABLET BY MOUTH EVERY DAY, Disp: 30 tablet, Rfl: 2   FLUoxetine (PROZAC) 40 MG capsule, TAKE TWO (2) CAPSULES BY MOUTH ONCE DAILY, Disp: 180 capsule, Rfl: 3   fluvoxaMINE (LUVOX) 100 MG tablet, 1/2 pill at bedtime for 1 week, then increase to 1 pill, Disp: 30 tablet, Rfl: 1   furosemide (LASIX) 40 MG tablet, Take 40 mg by mouth daily., Disp: , Rfl:    insulin glargine, 2 Unit Dial, (TOUJEO MAX SOLOSTAR) 300 UNIT/ML Solostar Pen, Inject 80 Units into the skin 2 (two) times daily., Disp: , Rfl:    Insulin Pen Needle (SURE COMFORT PEN NEEDLES) 31G X 5 MM MISC, Use as directed, Disp: 30 each, Rfl: 1   lamoTRIgine (LAMICTAL) 200 MG tablet, Take 1 tablet (200 mg total) by mouth 2 (two) times daily., Disp: 180 tablet, Rfl: 0   Lancet Devices (SIMPLE DIAGNOSTICS LANCING DEV) MISC, , Disp: , Rfl:    levocetirizine (XYZAL) 5 MG tablet, Take 5 mg by mouth every evening., Disp: , Rfl:  levothyroxine (SYNTHROID) 137 MCG tablet, Take 137 mcg by mouth daily., Disp: , Rfl:    montelukast (SINGULAIR) 10 MG tablet, TAKE 1 TABLET BY MOUTH EVERY DAY, Disp: 90 tablet, Rfl: 1   pantoprazole (PROTONIX) 40 MG tablet, TAKE 1 TABLET BY MOUTH TWICE A DAY (Patient taking differently: Take 40 mg by mouth 2 (two) times daily.), Disp: 60 tablet, Rfl: 2   spironolactone (ALDACTONE) 25 MG tablet, Take 25 mg by mouth daily., Disp: , Rfl:    valsartan (DIOVAN) 80 MG tablet, Take 1 tablet (80 mg total) by mouth daily., Disp: 90 tablet, Rfl: 0   Vitamin  D, Ergocalciferol, (DRISDOL) 50000 UNITS CAPS capsule, Take 50,000 Units by mouth every Tuesday. At bedtime, Disp: , Rfl:    BAYER CONTOUR TEST test strip, , Disp: , Rfl:    bumetanide (BUMEX) 1 MG tablet, Take 1 mg by mouth daily. (Patient not taking: Reported on 01/22/2023), Disp: , Rfl:    SYNJARDY XR 25-1000 MG TB24, TAKE 1 TABLET BY MOUTH EVERY DAY IN THE MORNING (Patient not taking: Reported on 10/12/2022), Disp: 30 tablet, Rfl: 2 Medication Side Effects: weight gain and tremor  Family Medical/ Social History: Changes?    none  MENTAL HEALTH EXAM:  There were no vitals taken for this visit.There is no height or weight on file to calculate BMI.  General Appearance: Casual, Neat, Well Groomed and Obese  Eye Contact:  Good  Speech:  Clear and Coherent and Normal Rate  Volume:  Normal  Mood:  Euthymic  Affect:  Congruent  Thought Process:  Goal Directed and Descriptions of Associations: Circumstantial  Orientation:  Full (Time, Place, and Person)  Thought Content: Logical   Suicidal Thoughts:  No  Homicidal Thoughts:  No  Memory:  WNL  Judgement:  Good  Insight:  Good  Psychomotor Activity:  Normal  Concentration:  Concentration: Good and Attention Span: Good  Recall:  Good  Fund of Knowledge: Good  Language: Good  Assets:  Communication Skills Desire for Improvement Financial Resources/Insurance Housing Resilience Transportation  ADL's:  Intact  Cognition: WNL  Prognosis:  Good   DIAGNOSES:  No diagnosis found.  Receiving Psychotherapy: Yes   with Elio Forget, [facility].  RECOMMENDATIONS:  PDMP reviewed.  Klonopin filled 08/13/2023.     Continue Rexulti 1 mg, 1 p.o. daily. Continue BuSpar 30 mg, 1 p.o. twice daily. Continue klonopin 0.5 mg, 1 every day as needed. Continue Prozac 40 mg, 2 p.o. daily. Continue Lamictal  200 mg po bid.  Continue vitamin D, add multivitamin and B complex. Fish oil also. Continue CPAP use. Continue therapy with Elio Forget, [facility]  [facility]. Return in 6 weeks.    Melony Overly, PA-C

## 2023-11-08 ENCOUNTER — Telehealth: Payer: Self-pay | Admitting: Neurology

## 2023-11-08 NOTE — Telephone Encounter (Signed)
Pt called wanting to know if MD can evaluate her again for Parkinson's. Pt states that she can barley write, drinks her coffee with a straw and has to eat with only a spoon. Please advise.

## 2023-11-08 NOTE — Telephone Encounter (Signed)
At this juncture, I recommend she talk to her PCP about a referral to another movement d/o specialist. I really don't think I have anything else to offer at this time. Please explain to patient.

## 2023-11-08 NOTE — Telephone Encounter (Signed)
At last visit with Dr Frances Furbish October/November 2023, Dr Frances Furbish said the following: "It was nice to meet you both today.  Your tremors may at least be in part secondary to medication such as Risperdal and Prozac.I would not recommend any new medication for tremor control at this time as you are already on multiple medications.  Tremor medication such as Mysoline can affect balance and make you sleepy and increase her fall risk.  You have already fallen several times.  Please use your walker at all times for gait safety and fall prevention.  Please talk to your psychiatrist about the possibility of reducing some of the medications.  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 She is was just taken off Prozac yesterday. Regarding psychiatric medications, the patient is on Buspirone, Rexulti & Clonazepam.

## 2023-11-09 NOTE — Telephone Encounter (Signed)
I called pt and relayed to pt that Dr. Frances Furbish, after reviewing chart again, recommended seeing pcp for another referral to a movement specialist as she did not have anything more to offer her at this juncture.  She appreciated call back.

## 2023-11-22 ENCOUNTER — Ambulatory Visit (INDEPENDENT_AMBULATORY_CARE_PROVIDER_SITE_OTHER): Payer: 59 | Admitting: Physician Assistant

## 2023-12-03 ENCOUNTER — Other Ambulatory Visit: Payer: Self-pay | Admitting: Physician Assistant

## 2023-12-03 ENCOUNTER — Other Ambulatory Visit: Payer: Self-pay

## 2023-12-03 MED ORDER — ALPRAZOLAM 0.5 MG PO TABS: 0.5000 mg | ORAL_TABLET | Freq: Three times a day (TID) | ORAL | 0 refills | Status: DC | PRN

## 2023-12-03 NOTE — Telephone Encounter (Signed)
Several visits back she and I had discussed changing Klonopin to Xanax because the Xanax works quicker although does not last as long.  I will refuse this prescription and send Xanax to this [facility]

## 2023-12-10 ENCOUNTER — Encounter: Payer: Self-pay | Admitting: Physician Assistant

## 2023-12-10 ENCOUNTER — Ambulatory Visit: Payer: Commercial Managed Care - HMO | Admitting: Physician Assistant

## 2023-12-10 DIAGNOSIS — F4323 Adjustment disorder with mixed anxiety and depressed mood: Secondary | ICD-10-CM

## 2023-12-10 DIAGNOSIS — R251 Tremor, unspecified: Secondary | ICD-10-CM | POA: Diagnosis not present

## 2023-12-10 DIAGNOSIS — Z6379 Other stressful life events affecting family and household: Secondary | ICD-10-CM | POA: Diagnosis not present

## 2023-12-10 MED ORDER — CLONAZEPAM 1 MG PO TABS: 0.5000 mg | ORAL_TABLET | Freq: Three times a day (TID) | ORAL | 0 refills | Status: AC | PRN

## 2023-12-10 NOTE — Progress Notes (Signed)
[facility]  Patient ID: Novena Paterson,  MRN: [id number]  PCP: Pcp, No  Date of Evaluation: 12/09/2023 Time spent:30 minutes  Chief Complaint:   HISTORY/CURRENT STATUS: HPI  Not doing well, son Chip is with her  Husband was dx with Stage IV cancer, was having pain and bloating, they thought it was gall bladder. He went to ER several weeks ago and that started the surgery, bx, staging and a treatment plan will be made soon. She's understandably anxious and sad.   A huge issue right now is that her dtr thinks she's taking too much medication. She has tremor off and on but it's much better than in the past. However, she's been off Rexulti for a few weeks now b/c expense and the tremor has improved. She's able to eat more without shaking and doesn't have to drink coffee with a straw. It's hard to say if her mood has been affected b/c of the sadness and anxiety that she feels.   Energy and motivation are good.  Sleeps well most of the time. ADLs and personal hygiene are normal.   Denies any changes in concentration, making decisions, or remembering things.  Appetite has not changed.  Weight is stable.  Anxious but not quite as bad as when her husband was first dx. Feels panicky a lot of the time, on top of being overwhelmed.  Denies suicidal or homicidal thoughts.  Patient denies increased energy with decreased need for sleep, increased talkativeness, racing thoughts, impulsivity or risky behaviors, increased spending, increased libido, grandiosity, increased irritability or anger, paranoia, or hallucinations.  Review of Systems  Constitutional: Negative.   HENT: Negative.    Eyes: Negative.   Respiratory: Negative.    Cardiovascular: Negative.   Gastrointestinal: Negative.   Genitourinary: Negative.   Musculoskeletal: Negative.   Skin: Negative.   Neurological:        See HPI  Endo/Heme/Allergies: Negative.   Psychiatric/Behavioral:         See HPI    Individual Medical History/ Review of Systems: Changes? :No    Past medications for mental health diagnoses include: Cymbalta, Depakote, Effexor, Celexa Wellbutrin, Abilify, Zoloft ? Lexapro?, Prozac, Buspar, Klonopin, Rexulti was effective but too expensive. Risperdal caused falls, Artane caused Falls, seroquel for sleep  Allergies: Artane [trihexyphenidyl], Risperdal [risperidone], Ace inhibitors, and Sulfa antibiotics  Current Medications:  Current Outpatient Medications:    albuterol (VENTOLIN HFA) 108 (90 Base) MCG/ACT inhaler, Inhale 2 puffs into the lungs every 6 (six) hours as needed (Asthma)., Disp: , Rfl:    aspirin EC 81 MG tablet, Take 81 mg by mouth daily. Swallow whole., Disp: , Rfl:    atorvastatin (LIPITOR) 40 MG tablet, Take 1 tablet (40 mg total) by mouth daily. (Patient taking differently: Take 40 mg by mouth at bedtime.), Disp: 30 tablet, Rfl: 2   busPIRone (BUSPAR) 30 MG tablet, Take 1 tablet (30 mg total) by mouth 2 (two) times daily., Disp: 60 tablet, Rfl: 5   carvedilol (COREG) 6.25 MG tablet, Take 6.25 mg by mouth 2 (two) times daily., Disp: , Rfl:    etodolac (LODINE) 400 MG tablet, TAKE 1 TABLET BY MOUTH TWICE A DAY WITH FOOD (Patient taking differently: Take 200 mg by mouth 2 (two) times daily.), Disp: 60 tablet, Rfl: 2   FARXIGA 10 MG TABS tablet, Take 10 mg by mouth daily., Disp: , Rfl:    fenofibrate 54 MG tablet, TAKE 1 TABLET BY MOUTH EVERY DAY, Disp: 30 tablet, Rfl: 2  fluvoxaMINE (LUVOX) 100 MG tablet, 1/2 pill at bedtime for 1 week, then increase to 1 pill (Patient taking differently: Take 100 mg by mouth at bedtime.), Disp: 30 tablet, Rfl: 1   furosemide (LASIX) 40 MG tablet, Take 40 mg by mouth daily., Disp: , Rfl:    insulin glargine, 2 Unit Dial, (TOUJEO MAX SOLOSTAR) 300 UNIT/ML Solostar Pen, Inject 80 Units into the skin 2 (two) times daily., Disp: , Rfl:    Insulin Pen Needle (SURE COMFORT PEN NEEDLES) 31G X 5 MM MISC, Use as directed, Disp: 30  each, Rfl: 1   lamoTRIgine (LAMICTAL) 200 MG tablet, Take 1 tablet (200 mg total) by mouth 2 (two) times daily., Disp: 180 tablet, Rfl: 0   Lancet Devices (SIMPLE DIAGNOSTICS LANCING DEV) MISC, , Disp: , Rfl:    levocetirizine (XYZAL) 5 MG tablet, Take 5 mg by mouth every evening., Disp: , Rfl:    levothyroxine (SYNTHROID) 137 MCG tablet, Take 137 mcg by mouth daily., Disp: , Rfl:    montelukast (SINGULAIR) 10 MG tablet, TAKE 1 TABLET BY MOUTH EVERY DAY, Disp: 90 tablet, Rfl: 1   pantoprazole (PROTONIX) 40 MG tablet, TAKE 1 TABLET BY MOUTH TWICE A DAY (Patient taking differently: Take 40 mg by mouth 2 (two) times daily.), Disp: 60 tablet, Rfl: 2   spironolactone (ALDACTONE) 25 MG tablet, Take 25 mg by mouth daily., Disp: , Rfl:    valsartan (DIOVAN) 80 MG tablet, Take 1 tablet (80 mg total) by mouth daily., Disp: 90 tablet, Rfl: 0   Vitamin D, Ergocalciferol, (DRISDOL) 50000 UNITS CAPS capsule, Take 50,000 Units by mouth every Tuesday. At bedtime, Disp: , Rfl:    BAYER CONTOUR TEST test strip, , Disp: , Rfl:    bumetanide (BUMEX) 1 MG tablet, Take 1 mg by mouth daily. (Patient not taking: Reported on 12/10/2023), Disp: , Rfl:    clonazePAM (KLONOPIN) 1 MG tablet, Take 0.5 tablets (0.5 mg total) by mouth 3 (three) times daily as needed for anxiety., Disp: 90 tablet, Rfl: 0   SYNJARDY XR 25-1000 MG TB24, TAKE 1 TABLET BY MOUTH EVERY DAY IN THE MORNING (Patient not taking: No sig reported), Disp: 30 tablet, Rfl: 2 Medication Side Effects: weight gain and tremor  Family Medical/ Social History: Changes?    See HPI  MENTAL HEALTH EXAM:  There were no vitals taken for this visit.There is no height or weight on file to calculate BMI.  General Appearance: Casual, Neat, Well Groomed and Obese  Eye Contact:  Good  Speech:  Clear and Coherent and Normal Rate  Volume:  Normal  Mood:  Anxious and Depressed  Affect:  Depressed, Tearful, and Anxious  Thought Process:  Goal Directed and Descriptions of  Associations: Circumstantial  Orientation:  Full (Time, Place, and Person)  Thought Content: Logical   Suicidal Thoughts:  No  Homicidal Thoughts:  No  Memory:  WNL  Judgement:  Good  Insight:  Good  Psychomotor Activity:   Hands shake when at rest, worse when she's more upset talking about her husband  Concentration:  Concentration: Good and Attention Span: Good  Recall:  Good  Fund of Knowledge: Good  Language: Good  Assets:  Communication Skills Desire for Improvement Financial Resources/Insurance Housing Resilience Transportation  ADL's:  Intact  Cognition: WNL  Prognosis:  Good   DIAGNOSES:    ICD-10-CM   1. Situational mixed anxiety and depressive disorder  F43.23     2. Tremor  R25.1     3. Stress  due to illness of family member  Z63.79       Receiving Psychotherapy: Yes   with Elio Forget, [facility].  RECOMMENDATIONS:  PDMP reviewed.  Klonopin filled 08/13/2023. I provided 30 minutes of face to face time during this encounter, including time spent before and after the visit in records review, medical decision making, counseling pertinent to today's visit, and charting.   I'm sorry to hear about her husband.   We had a long discussion about her meds. This is NOT the time to manipulate meds or doses. With the upcoming treatment plan for her husband, and all that entails, making changes is not smart. Since she's off the Rexulti, and the tremor is better, it's fine to stay off that now. I do rec increasing the Klonopin dose. States her dtr thinks she's taking too much and is addicted. I went over the PDMP with Arline Asp and her son, Chip. A Rx lasts her months so I have no concerns about that right now. She and Chip understand.   Continue BuSpar 30 mg, 1 p.o. twice daily. Increase Klonopin to 1 mg, 1/2 tid prn. (Or 1/2 during the day and 1 at bedtime.) Continue Luvox 100 mg, 1 po at bedtime.  Continue Lamictal  200 mg po bid.  Continue vitamin D, add multivitamin and B  complex. Fish oil also. Continue CPAP use. Continue therapy with Elio Forget, [facility]. Return in 3-4 weeks.    Melony Overly, PA-C

## 2024-01-05 ENCOUNTER — Other Ambulatory Visit: Payer: Self-pay | Admitting: Physician Assistant

## 2024-01-24 ENCOUNTER — Telehealth: Payer: Self-pay | Admitting: Physician Assistant

## 2024-01-24 ENCOUNTER — Other Ambulatory Visit: Payer: Self-pay

## 2024-01-24 MED ORDER — FLUVOXAMINE MALEATE 100 MG PO TABS: 100.0000 mg | ORAL_TABLET | Freq: Every day | ORAL | 0 refills | Status: DC

## 2024-01-24 NOTE — Telephone Encounter (Signed)
Sent 100 mg fluvoxamine to [facility].

## 2024-01-24 NOTE — Telephone Encounter (Signed)
Next visit is 02/07/24. Rebecca Roth is requesting a refill on her Fluvoxamine called to:  [facility], [address], [place], [place] [address].  Phone number is Phone: [phone]  Her husband passed away over the weekend.

## 2024-01-26 ENCOUNTER — Ambulatory Visit: Payer: Commercial Managed Care - HMO | Admitting: Physician Assistant

## 2024-01-28 DIAGNOSIS — G4733 Obstructive sleep apnea (adult) (pediatric): Secondary | ICD-10-CM | POA: Diagnosis not present

## 2024-01-28 DIAGNOSIS — I10 Essential (primary) hypertension: Secondary | ICD-10-CM | POA: Diagnosis not present

## 2024-02-01 DIAGNOSIS — Z6841 Body Mass Index (BMI) 40.0 and over, adult: Secondary | ICD-10-CM | POA: Diagnosis not present

## 2024-02-01 DIAGNOSIS — M542 Cervicalgia: Secondary | ICD-10-CM | POA: Diagnosis not present

## 2024-02-07 ENCOUNTER — Encounter: Payer: Self-pay | Admitting: Physician Assistant

## 2024-02-07 ENCOUNTER — Ambulatory Visit (INDEPENDENT_AMBULATORY_CARE_PROVIDER_SITE_OTHER): Payer: 59 | Admitting: Physician Assistant

## 2024-02-07 DIAGNOSIS — F4321 Adjustment disorder with depressed mood: Secondary | ICD-10-CM

## 2024-02-07 DIAGNOSIS — F422 Mixed obsessional thoughts and acts: Secondary | ICD-10-CM | POA: Diagnosis not present

## 2024-02-07 DIAGNOSIS — F4323 Adjustment disorder with mixed anxiety and depressed mood: Secondary | ICD-10-CM | POA: Diagnosis not present

## 2024-02-07 MED ORDER — FLUVOXAMINE MALEATE 100 MG PO TABS: 150.0000 mg | ORAL_TABLET | Freq: Every day | ORAL | 1 refills | Status: DC

## 2024-02-07 NOTE — Progress Notes (Signed)
 [facility]  Patient ID: Rebecca Roth,  MRN: [id number]  PCP: Pcp, No  Date of Evaluation: 02/07/2024 Time spent:20 minutes  Chief Complaint:  Chief Complaint   Anxiety; Depression; Follow-up    HISTORY/CURRENT STATUS: HPI  Situational depression and anxiety f/u  Her husband died on Feb 06, 2024, he had St IV cancer of bile duct. He was dx only 8 weeks before. "I'm ok. It's ok. We had 44 years together, mostly good times." Life is different but "I cry very little. But I've great peace." Her daughter is treating her well.  She is sleeping well.  ADLs and personal hygiene are normal.  Appetite is normal and weight is stable.  No suicidal or homicidal thoughts.  She has her moments of being anxious but it is not severe and she has not taken the Klonopin  at all.  A couple of months ago we changed Klonopin  to Xanax .  However the Klonopin  has been more effective when needed.  She was surprised that the pharmacist gave her both the Xanax  and Klonopin .  (This is also noted on the PDMP.)  States she will not be taking the Xanax .  The OCD is much worse, she wonders if increasing the Luvox  is an option.  Patient denies increased energy with decreased need for sleep, increased talkativeness, racing thoughts, impulsivity or risky behaviors, increased spending, increased libido, grandiosity, increased irritability or anger, paranoia, or hallucinations.  Denies dizziness, syncope, seizures, numbness, tingling, tremor, tics, unsteady gait, slurred speech, confusion. Denies muscle or joint pain, stiffness, or dystonia. Denies unexplained weight loss, frequent infections, or sores that heal slowly.  No polyphagia, polydipsia, or polyuria. Denies visual changes or paresthesias.   Individual Medical History/ Review of Systems: Changes? :No    Past medications for mental health diagnoses include: Cymbalta, Depakote, Effexor, Celexa Wellbutrin, Abilify, Zoloft ? Lexapro?, Prozac , Buspar ,  Klonopin , Rexulti  was effective but too expensive. Risperdal  caused falls, Artane  caused Falls, seroquel  for sleep  Allergies: Artane  [trihexyphenidyl ], Risperdal  [risperidone ], Ace inhibitors, and Sulfa antibiotics  Current Medications:  Current Outpatient Medications:    albuterol  (VENTOLIN  HFA) 108 (90 Base) MCG/ACT inhaler, Inhale 2 puffs into the lungs every 6 (six) hours as needed (Asthma)., Disp: , Rfl:    aspirin EC 81 MG tablet, Take 81 mg by mouth daily. Swallow whole., Disp: , Rfl:    atorvastatin  (LIPITOR) 40 MG tablet, Take 1 tablet (40 mg total) by mouth daily. (Patient taking differently: Take 40 mg by mouth at bedtime.), Disp: 30 tablet, Rfl: 2   busPIRone  (BUSPAR ) 30 MG tablet, Take 1 tablet (30 mg total) by mouth 2 (two) times daily., Disp: 60 tablet, Rfl: 5   carvedilol (COREG) 6.25 MG tablet, Take 6.25 mg by mouth 2 (two) times daily., Disp: , Rfl:    clonazePAM  (KLONOPIN ) 1 MG tablet, Take 0.5 tablets (0.5 mg total) by mouth 3 (three) times daily as needed for anxiety., Disp: 90 tablet, Rfl: 0   etodolac (LODINE) 400 MG tablet, TAKE 1 TABLET BY MOUTH TWICE A DAY WITH FOOD (Patient taking differently: Take 200 mg by mouth 2 (two) times daily.), Disp: 60 tablet, Rfl: 2   fenofibrate 54 MG tablet, TAKE 1 TABLET BY MOUTH EVERY DAY, Disp: 30 tablet, Rfl: 2   fluvoxaMINE  (LUVOX ) 100 MG tablet, Take 1.5 tablets (150 mg total) by mouth at bedtime., Disp: 45 tablet, Rfl: 1   furosemide (LASIX) 40 MG tablet, Take 40 mg by mouth daily., Disp: , Rfl:    insulin glargine, 2 Unit  Dial, (TOUJEO MAX SOLOSTAR) 300 UNIT/ML Solostar Pen, Inject 80 Units into the skin 2 (two) times daily., Disp: , Rfl:    Insulin Pen Needle (SURE COMFORT PEN NEEDLES) 31G X 5 MM MISC, Use as directed, Disp: 30 each, Rfl: 1   lamoTRIgine  (LAMICTAL ) 200 MG tablet, TAKE 1 TABLET BY MOUTH TWICE DAILY, Disp: 60 tablet, Rfl: 10   Lancet Devices (SIMPLE DIAGNOSTICS LANCING DEV) MISC, , Disp: , Rfl:    levocetirizine  (XYZAL) 5 MG tablet, Take 5 mg by mouth every evening., Disp: , Rfl:    levothyroxine (SYNTHROID) 137 MCG tablet, Take 137 mcg by mouth daily., Disp: , Rfl:    montelukast (SINGULAIR) 10 MG tablet, TAKE 1 TABLET BY MOUTH EVERY DAY, Disp: 90 tablet, Rfl: 1   pantoprazole (PROTONIX) 40 MG tablet, TAKE 1 TABLET BY MOUTH TWICE A DAY (Patient taking differently: Take 40 mg by mouth 2 (two) times daily.), Disp: 60 tablet, Rfl: 2   spironolactone (ALDACTONE) 25 MG tablet, Take 25 mg by mouth daily., Disp: , Rfl:    valsartan  (DIOVAN ) 80 MG tablet, Take 1 tablet (80 mg total) by mouth daily., Disp: 90 tablet, Rfl: 0   BAYER CONTOUR TEST test strip, , Disp: , Rfl:    bumetanide (BUMEX) 1 MG tablet, Take 1 mg by mouth daily. (Patient not taking: Reported on 01/22/2023), Disp: , Rfl:    FARXIGA 10 MG TABS tablet, Take 10 mg by mouth daily. (Patient not taking: Reported on 02/07/2024), Disp: , Rfl:    SYNJARDY XR 25-1000 MG TB24, TAKE 1 TABLET BY MOUTH EVERY DAY IN THE MORNING (Patient not taking: No sig reported), Disp: 30 tablet, Rfl: 2   Vitamin D, Ergocalciferol, (DRISDOL) 50000 UNITS CAPS capsule, Take 50,000 Units by mouth every Tuesday. At bedtime (Patient not taking: Reported on 02/07/2024), Disp: , Rfl:  Medication Side Effects: weight gain and tremor  Family Medical/ Social History: Changes?   Her husband died January 26, 2024  MENTAL HEALTH EXAM:  There were no vitals taken for this visit.There is no height or weight on file to calculate BMI.  General Appearance: Casual, Neat, Well Groomed and Obese  Eye Contact:  Good  Speech:  Clear and Coherent and Normal Rate  Volume:  Normal  Mood:  Euthymic  Affect:   Tearful when she talks about her husband but easily consoled  Thought Process:  Goal Directed and Descriptions of Associations: Circumstantial  Orientation:  Full (Time, Place, and Person)  Thought Content: Logical   Suicidal Thoughts:  No  Homicidal Thoughts:  No  Memory:  WNL  Judgement:   Good  Insight:  Good  Psychomotor Activity:  Normal  Concentration:  Concentration: Good and Attention Span: Good  Recall:  Good  Fund of Knowledge: Good  Language: Good  Assets:  Communication Skills Desire for Improvement Financial Resources/Insurance Housing Resilience Transportation  ADL's:  Intact  Cognition: WNL  Prognosis:  Good   DIAGNOSES:    ICD-10-CM   1. Situational mixed anxiety and depressive disorder  F43.23     2. Mixed obsessional thoughts and acts  F42.2     3. Grief  F43.21      Receiving Psychotherapy: Yes   with Marquette Sites, [facility].  RECOMMENDATIONS:  PDMP reviewed.  Klonopin  last filled 12/10/2023. I provided 20 minutes of face to face time during this encounter, including time spent before and after the visit in records review, medical decision making, counseling pertinent to today's visit, and charting.   My condolences  in the loss of her husband.   She is doing well except for worsening of the OCD.  Increasing the Luvox  should be helpful.  She would like to do so.  As far as being given Xanax  and Klonopin  within a week, there must have been a refill on the Klonopin  and Xanax  was sent in after her husband's diagnosis.  I trust her not to take both, (she has not taken 1 or the other since our last visit) and advised her to put the Xanax  in a safe.  She can bring to me at her next visit for us  to properly dispose of.  Or she can keep them locked up, she knows not to take both at the same time.   Continue BuSpar  30 mg, 1 p.o. twice daily. Continue Klonopin  1 mg, 1/2 tid prn. (Or 1/2 during the day and 1 at bedtime.) Increase Luvox  100 mg to 1.5 pills nightly. Continue Lamictal   200 mg po bid.  Continue vitamin D, add multivitamin and B complex. Fish oil also. Continue CPAP use. Continue therapy with Marquette Sites, [facility]. Return in 4-6 weeks.    Marvia Slocumb, PA-C

## 2024-02-15 DIAGNOSIS — I2510 Atherosclerotic heart disease of native coronary artery without angina pectoris: Secondary | ICD-10-CM | POA: Diagnosis not present

## 2024-02-15 DIAGNOSIS — Z6841 Body Mass Index (BMI) 40.0 and over, adult: Secondary | ICD-10-CM | POA: Diagnosis not present

## 2024-02-15 DIAGNOSIS — E034 Atrophy of thyroid (acquired): Secondary | ICD-10-CM | POA: Diagnosis not present

## 2024-02-15 DIAGNOSIS — E1122 Type 2 diabetes mellitus with diabetic chronic kidney disease: Secondary | ICD-10-CM | POA: Diagnosis not present

## 2024-02-15 DIAGNOSIS — N1831 Chronic kidney disease, stage 3a: Secondary | ICD-10-CM | POA: Diagnosis not present

## 2024-02-15 DIAGNOSIS — E782 Mixed hyperlipidemia: Secondary | ICD-10-CM | POA: Diagnosis not present

## 2024-02-15 DIAGNOSIS — K219 Gastro-esophageal reflux disease without esophagitis: Secondary | ICD-10-CM | POA: Diagnosis not present

## 2024-02-15 DIAGNOSIS — Z794 Long term (current) use of insulin: Secondary | ICD-10-CM | POA: Diagnosis not present

## 2024-02-15 DIAGNOSIS — E1136 Type 2 diabetes mellitus with diabetic cataract: Secondary | ICD-10-CM | POA: Diagnosis not present

## 2024-02-15 DIAGNOSIS — F3341 Major depressive disorder, recurrent, in partial remission: Secondary | ICD-10-CM | POA: Diagnosis not present

## 2024-02-15 DIAGNOSIS — I1310 Hypertensive heart and chronic kidney disease without heart failure, with stage 1 through stage 4 chronic kidney disease, or unspecified chronic kidney disease: Secondary | ICD-10-CM | POA: Diagnosis not present

## 2024-02-15 DIAGNOSIS — G4733 Obstructive sleep apnea (adult) (pediatric): Secondary | ICD-10-CM | POA: Diagnosis not present

## 2024-03-06 ENCOUNTER — Other Ambulatory Visit: Payer: Self-pay | Admitting: Physician Assistant

## 2024-03-13 DIAGNOSIS — Z6841 Body Mass Index (BMI) 40.0 and over, adult: Secondary | ICD-10-CM | POA: Diagnosis not present

## 2024-03-13 DIAGNOSIS — M545 Low back pain, unspecified: Secondary | ICD-10-CM | POA: Diagnosis not present

## 2024-03-27 ENCOUNTER — Other Ambulatory Visit: Payer: Self-pay | Admitting: Physician Assistant

## 2024-03-28 DIAGNOSIS — Z961 Presence of intraocular lens: Secondary | ICD-10-CM | POA: Diagnosis not present

## 2024-03-28 DIAGNOSIS — E119 Type 2 diabetes mellitus without complications: Secondary | ICD-10-CM | POA: Diagnosis not present

## 2024-03-28 DIAGNOSIS — H26491 Other secondary cataract, right eye: Secondary | ICD-10-CM | POA: Diagnosis not present

## 2024-03-29 ENCOUNTER — Ambulatory Visit (INDEPENDENT_AMBULATORY_CARE_PROVIDER_SITE_OTHER): Payer: 59 | Admitting: Physician Assistant

## 2024-03-29 ENCOUNTER — Ambulatory Visit (INDEPENDENT_AMBULATORY_CARE_PROVIDER_SITE_OTHER): Payer: 59 | Admitting: Mental Health

## 2024-03-29 ENCOUNTER — Encounter: Payer: Self-pay | Admitting: Physician Assistant

## 2024-03-29 DIAGNOSIS — F4321 Adjustment disorder with depressed mood: Secondary | ICD-10-CM | POA: Diagnosis not present

## 2024-03-29 DIAGNOSIS — F4323 Adjustment disorder with mixed anxiety and depressed mood: Secondary | ICD-10-CM

## 2024-03-29 DIAGNOSIS — F422 Mixed obsessional thoughts and acts: Secondary | ICD-10-CM | POA: Diagnosis not present

## 2024-03-29 MED ORDER — FLUVOXAMINE MALEATE 100 MG PO TABS: 150.0000 mg | ORAL_TABLET | Freq: Every day | ORAL | 3 refills | Status: DC

## 2024-03-29 NOTE — Progress Notes (Signed)
 [facility]  Patient ID: Rebecca Roth,  MRN: [id number]  PCP: Pcp, No  Date of Evaluation: 03/29/2024  time spent:20 minutes  Chief Complaint:  Chief Complaint   Anxiety; Depression; Follow-up    HISTORY/CURRENT STATUS: HPI  OCD has gotten worse.   She increased the Luvox as directed at the LOV. It made her groggy so she went back to 100 mg. The OCD is pretty bad though, more checking and counting.  She cries sometimes, thinking about the things she misses now that her husband has been gone 2 months. "It's the little things." She's able to get through the tears and think of all the good things, and to live in the moment. They had 44 years together and she's grateful for that.   Patient is able to enjoy things.  Energy and motivation are good.   No feelings of hopelessness.  Sleeps well most of the time. ADLs and personal hygiene are normal.   Denies any changes in concentration, making decisions, or remembering things.  Appetite has not changed.  Weight is stable.  Denies laxative use, calorie restricting, or binging and purging.   Denies cutting or any form of self-harm.  Denies suicidal or homicidal thoughts.  Denies dizziness, syncope, seizures, numbness, tingling, tremor, tics, unsteady gait, slurred speech, confusion. Denies muscle or joint pain, stiffness, or dystonia. Denies unexplained weight loss, frequent infections, or sores that heal slowly.  No polyphagia, polydipsia, or polyuria. Denies visual changes or paresthesias.   Individual Medical History/ Review of Systems: Changes? :No    Past medications for mental health diagnoses include: Cymbalta, Depakote, Effexor, Celexa Wellbutrin, Abilify, Zoloft ? Lexapro?, Prozac, Buspar, Klonopin, Rexulti was effective but too expensive. Risperdal caused falls, Artane caused Falls, seroquel for sleep  Allergies: Artane [trihexyphenidyl], Risperdal [risperidone], Ace inhibitors, and Sulfa antibiotics  Current  Medications:  Current Outpatient Medications:    albuterol (VENTOLIN HFA) 108 (90 Base) MCG/ACT inhaler, Inhale 2 puffs into the lungs every 6 (six) hours as needed (Asthma)., Disp: , Rfl:    aspirin EC 81 MG tablet, Take 81 mg by mouth daily. Swallow whole., Disp: , Rfl:    atorvastatin (LIPITOR) 40 MG tablet, Take 1 tablet (40 mg total) by mouth daily. (Patient taking differently: Take 40 mg by mouth at bedtime.), Disp: 30 tablet, Rfl: 2   BAYER CONTOUR TEST test strip, , Disp: , Rfl:    busPIRone (BUSPAR) 30 MG tablet, TAKE 1 TABLET BY MOUTH TWICE DAILY, Disp: 60 tablet, Rfl: 1   carvedilol (COREG) 6.25 MG tablet, Take 6.25 mg by mouth 2 (two) times daily., Disp: , Rfl:    clonazePAM (KLONOPIN) 1 MG tablet, Take 0.5 tablets (0.5 mg total) by mouth 3 (three) times daily as needed for anxiety., Disp: 90 tablet, Rfl: 0   etodolac (LODINE) 400 MG tablet, TAKE 1 TABLET BY MOUTH TWICE A DAY WITH FOOD, Disp: 60 tablet, Rfl: 2   fenofibrate 54 MG tablet, TAKE 1 TABLET BY MOUTH EVERY DAY, Disp: 30 tablet, Rfl: 2   furosemide (LASIX) 40 MG tablet, Take 40 mg by mouth daily., Disp: , Rfl:    insulin glargine, 2 Unit Dial, (TOUJEO MAX SOLOSTAR) 300 UNIT/ML Solostar Pen, Inject 80 Units into the skin 2 (two) times daily., Disp: , Rfl:    Insulin Pen Needle (SURE COMFORT PEN NEEDLES) 31G X 5 MM MISC, Use as directed, Disp: 30 each, Rfl: 1   lamoTRIgine (LAMICTAL) 200 MG tablet, TAKE 1 TABLET BY MOUTH TWICE DAILY, Disp:  60 tablet, Rfl: 10   Lancet Devices (SIMPLE DIAGNOSTICS LANCING DEV) MISC, , Disp: , Rfl:    levocetirizine (XYZAL) 5 MG tablet, Take 5 mg by mouth every evening., Disp: , Rfl:    levothyroxine (SYNTHROID) 137 MCG tablet, Take 137 mcg by mouth daily., Disp: , Rfl:    montelukast (SINGULAIR) 10 MG tablet, TAKE 1 TABLET BY MOUTH EVERY DAY, Disp: 90 tablet, Rfl: 1   pantoprazole (PROTONIX) 40 MG tablet, TAKE 1 TABLET BY MOUTH TWICE A DAY (Patient taking differently: Take 40 mg by mouth 2 (two)  times daily.), Disp: 60 tablet, Rfl: 2   spironolactone (ALDACTONE) 25 MG tablet, Take 25 mg by mouth daily., Disp: , Rfl:    valsartan (DIOVAN) 80 MG tablet, Take 1 tablet (80 mg total) by mouth daily., Disp: 90 tablet, Rfl: 0   Vitamin D, Ergocalciferol, (DRISDOL) 50000 UNITS CAPS capsule, Take 50,000 Units by mouth every Tuesday. At bedtime, Disp: , Rfl:    bumetanide (BUMEX) 1 MG tablet, Take 1 mg by mouth daily. (Patient not taking: Reported on 03/29/2024), Disp: , Rfl:    FARXIGA 10 MG TABS tablet, Take 10 mg by mouth daily. (Patient not taking: Reported on 03/29/2024), Disp: , Rfl:    fluvoxaMINE (LUVOX) 100 MG tablet, Take 1.5 tablets (150 mg total) by mouth at bedtime., Disp: 135 tablet, Rfl: 3   SYNJARDY XR 25-1000 MG TB24, TAKE 1 TABLET BY MOUTH EVERY DAY IN THE MORNING (Patient not taking: No sig reported), Disp: 30 tablet, Rfl: 2 Medication Side Effects: weight gain and tremor  Family Medical/ Social History: Changes?   no  MENTAL HEALTH EXAM:  There were no vitals taken for this visit.There is no height or weight on file to calculate BMI.  General Appearance: Casual, Neat, Well Groomed and Obese  Eye Contact:  Good  Speech:  Clear and Coherent and Normal Rate  Volume:  Normal  Mood:  Euthymic  Affect:  Congruent  Thought Process:  Goal Directed and Descriptions of Associations: Circumstantial  Orientation:  Full (Time, Place, and Person)  Thought Content: Logical   Suicidal Thoughts:  No  Homicidal Thoughts:  No  Memory:  WNL  Judgement:  Good  Insight:  Good  Psychomotor Activity:  Normal  Concentration:  Concentration: Good and Attention Span: Good  Recall:  Good  Fund of Knowledge: Good  Language: Good  Assets:  Communication Skills Desire for Improvement Financial Resources/Insurance Housing Resilience Transportation  ADL's:  Intact  Cognition: WNL  Prognosis:  Good   DIAGNOSES:    ICD-10-CM   1. Mixed obsessional thoughts and acts  F42.2     2.  Situational mixed anxiety and depressive disorder  F43.23     3. Grief  F43.21      Receiving Psychotherapy: Yes   with Elio Forget, [facility].  RECOMMENDATIONS:  PDMP reviewed.  Klonopin last filled 12/10/2023. I provided 20 minutes of face to face time during this encounter, including time spent before and after the visit in records review, medical decision making, counseling pertinent to today's visit, and charting.   The OCD is worse and even though the higher dose of Luvox caused her to be drowsy the next morning, which caused her to go back to the prior dose, she's willing to retry the higher dose.   Continue BuSpar 30 mg, 1 p.o. twice daily. Continue Klonopin 1 mg, 1/2 tid prn. (Or 1/2 during the day and 1 at bedtime.) Increase Luvox 100 mg ,1.5 pills nightly.  Continue Lamictal  200 mg po bid.  Continue vitamin D, add multivitamin and B complex. Fish oil also. Continue CPAP use. Continue therapy with Elio Forget, [facility]. Return in 6-8 weeks.  Melony Overly, PA-C

## 2024-03-29 NOTE — Progress Notes (Signed)
 [facility] Counselor/Therapist Progress Note     Patient ID: Rebecca Roth, MRN: [id number]   Date:    03/29/24   Timespent: 50 minutes   Treatment Type: Individual   Mental Status Exam:        Appearance:    Casual     Behavior:   Appropriate  Motor:   Normal  Speech/Language:    Normal Rate  Affect:   Full Range  Mood:   Euthymic  Thought process:   Coherent and Relevant  Thought content:     Logical  Perceptual disturbances:     Normal  Orientation:   Full (Time, Place, and Person)  Attention:   Good  Concentration:   good  Memory:    Intact  Fund of knowledge:    Good  Insight:     Good  Judgment:    Good  Impulse Control:   good     Reported Symptoms:  anxiety,  deficits with attention and concentration, history of panic attacks   Risk Assessment: Danger to Self:  No Self-injurious Behavior: No Danger to Others: No Duty to Warn:no Physical Aggression / Violence:No  Access to Firearms a concern: No  Gang Involvement:No  Patient / guardian was educated about steps to take if suicide or homicide risk level increases between visits. While future psychiatric events cannot be accurately predicted, the patient does not currently require acute inpatient psychiatric care and does not currently meet [facility] involuntary commitment criteria.   Subjective: Patient shared progress since last visit.  She shared how her husband passed away about 3 months ago, most of the session was centered around this loss.  She described in detail events leading up to his passing, his being ill going to the hospital and ultimately receiving hospice care.  She shared how meaningful it was to be able to spend time with him prior to his passing.  Support was provided as well as significant space for patient to share details and process feelings related.  She reported that she is coping well overall, feels she is been able to continue to work through the grief process.  She has  been able to make some changes at home, make adjustments by keeping items that are meaningful while letting go of others.  Ways to cope and care for herself, utilizing her support system which consists of friends, family and church family was encouraged.  Invited her to reschedule a sooner appointment if needed between visits.  Interventions:CBT, Solution Focused and Strength-based   Diagnosis:     ICD-10-CM    1. Recurrent major depressive disorder, in full remission (HCC)  F33.42             Plan:  1.  Patient to continue to engage in individual counseling 2-4 times a month or as needed. 2.  Patient to identify and apply CBT, coping skills learned in session to decrease depression and anxiety symptoms. 3.  Patient to improve effective communication and assertiveness with others. 4.  Patient to decrease obsessive compulsive behaviors with systematic desensitization as discussed in session. 5.  Patient to contact this office, go to the local ED or call 911 if a crisis or emergency develops between visits.   Waldron Session, [facility]

## 2024-05-09 ENCOUNTER — Other Ambulatory Visit: Payer: Self-pay | Admitting: Physician Assistant

## 2024-05-24 DIAGNOSIS — Z6841 Body Mass Index (BMI) 40.0 and over, adult: Secondary | ICD-10-CM | POA: Diagnosis not present

## 2024-05-24 DIAGNOSIS — Z9181 History of falling: Secondary | ICD-10-CM | POA: Diagnosis not present

## 2024-05-24 DIAGNOSIS — E1136 Type 2 diabetes mellitus with diabetic cataract: Secondary | ICD-10-CM | POA: Diagnosis not present

## 2024-05-24 DIAGNOSIS — E782 Mixed hyperlipidemia: Secondary | ICD-10-CM | POA: Diagnosis not present

## 2024-05-24 DIAGNOSIS — F3341 Major depressive disorder, recurrent, in partial remission: Secondary | ICD-10-CM | POA: Diagnosis not present

## 2024-05-24 DIAGNOSIS — N1831 Chronic kidney disease, stage 3a: Secondary | ICD-10-CM | POA: Diagnosis not present

## 2024-05-24 DIAGNOSIS — I1310 Hypertensive heart and chronic kidney disease without heart failure, with stage 1 through stage 4 chronic kidney disease, or unspecified chronic kidney disease: Secondary | ICD-10-CM | POA: Diagnosis not present

## 2024-05-24 DIAGNOSIS — E1122 Type 2 diabetes mellitus with diabetic chronic kidney disease: Secondary | ICD-10-CM | POA: Diagnosis not present

## 2024-05-24 DIAGNOSIS — I2510 Atherosclerotic heart disease of native coronary artery without angina pectoris: Secondary | ICD-10-CM | POA: Diagnosis not present

## 2024-05-24 DIAGNOSIS — K219 Gastro-esophageal reflux disease without esophagitis: Secondary | ICD-10-CM | POA: Diagnosis not present

## 2024-05-24 DIAGNOSIS — E034 Atrophy of thyroid (acquired): Secondary | ICD-10-CM | POA: Diagnosis not present

## 2024-05-24 DIAGNOSIS — Z794 Long term (current) use of insulin: Secondary | ICD-10-CM | POA: Diagnosis not present

## 2024-05-30 ENCOUNTER — Encounter: Payer: Self-pay | Admitting: Physician Assistant

## 2024-05-30 ENCOUNTER — Ambulatory Visit (INDEPENDENT_AMBULATORY_CARE_PROVIDER_SITE_OTHER): Admitting: Physician Assistant

## 2024-05-30 DIAGNOSIS — F422 Mixed obsessional thoughts and acts: Secondary | ICD-10-CM

## 2024-05-30 DIAGNOSIS — F3341 Major depressive disorder, recurrent, in partial remission: Secondary | ICD-10-CM | POA: Diagnosis not present

## 2024-05-30 DIAGNOSIS — F411 Generalized anxiety disorder: Secondary | ICD-10-CM

## 2024-05-30 DIAGNOSIS — F4321 Adjustment disorder with depressed mood: Secondary | ICD-10-CM

## 2024-05-30 NOTE — Progress Notes (Signed)
 [facility]  Patient ID: Rebecca Roth,  MRN: [id number]  PCP: Pcp, No  Date of Evaluation: 05/30/2024  time spent:20 minutes  Chief Complaint:  Chief Complaint   Anxiety; Depression; Follow-up    HISTORY/CURRENT STATUS: HPI  For routine med f/u  She decreased the Luvox  back to 100 mg.  Didn't fee like she really needed it. "I'm really doing well under the circumstances." Her dtr isn't being nice.  Long story. "I'm grieving but I think it's normal." Gets anxious occas, takes Klonopin  when she absolutely needs it. Her dtr accuses her of taking it too much. It frustrates Rebecca Roth b/c she's not taking it often at all. OCD is some better. She made some changes in her tv, so alerts on the screen won't show, which was causing an issue; counting the letters as they scrolled by or something like that.   Patient is able to enjoy things.  Energy and motivation are good most of the time.  No extreme sadness or feelings of hopelessness.  She cries at times b/c missing her husband who died in 2024/01/20.  Sleeps well most of the time. ADLs and personal hygiene are normal.   Denies any changes in concentration, making decisions, or remembering things.  Appetite has not changed.  Weight is stable.  Denies suicidal or homicidal thoughts.  Patient denies increased energy with decreased need for sleep, increased talkativeness, racing thoughts, impulsivity or risky behaviors, increased spending, increased libido, grandiosity, increased irritability or anger, paranoia, or hallucinations.  Denies dizziness, syncope, seizures, numbness, tingling, tremor, tics, unsteady gait, slurred speech, confusion. Denies muscle or joint pain, stiffness, or dystonia.  Individual Medical History/ Review of Systems: Changes? :No    Past medications for mental health diagnoses include: Cymbalta, Depakote, Effexor, Celexa Wellbutrin, Abilify, Zoloft ? Lexapro?, Prozac , Buspar , Klonopin , Rexulti  was effective but  too expensive. Risperdal  caused falls, Artane  caused Falls, seroquel  for sleep  Allergies: Artane  [trihexyphenidyl ], Risperdal  [risperidone ], Ace inhibitors, and Sulfa antibiotics  Current Medications:  Current Outpatient Medications:    aspirin EC 81 MG tablet, Take 81 mg by mouth daily. Swallow whole., Disp: , Rfl:    atorvastatin  (LIPITOR) 40 MG tablet, Take 1 tablet (40 mg total) by mouth daily. (Patient taking differently: Take 40 mg by mouth at bedtime.), Disp: 30 tablet, Rfl: 2   BAYER CONTOUR TEST test strip, , Disp: , Rfl:    busPIRone  (BUSPAR ) 30 MG tablet, TAKE 1 TABLET BY MOUTH TWICE DAILY, Disp: 60 tablet, Rfl: 1   carvedilol (COREG) 6.25 MG tablet, Take 6.25 mg by mouth 2 (two) times daily., Disp: , Rfl:    clonazePAM  (KLONOPIN ) 1 MG tablet, Take 0.5 tablets (0.5 mg total) by mouth 3 (three) times daily as needed for anxiety., Disp: 90 tablet, Rfl: 0   etodolac (LODINE) 400 MG tablet, TAKE 1 TABLET BY MOUTH TWICE A DAY WITH FOOD, Disp: 60 tablet, Rfl: 2   fenofibrate 54 MG tablet, TAKE 1 TABLET BY MOUTH EVERY DAY, Disp: 30 tablet, Rfl: 2   fluvoxaMINE  (LUVOX ) 100 MG tablet, TAKE 1.5 TABLETS BY MOUTH AT BEDTIME. (Patient taking differently: Take 100 mg by mouth at bedtime.), Disp: 45 tablet, Rfl: 1   furosemide (LASIX) 40 MG tablet, Take 40 mg by mouth daily., Disp: , Rfl:    Insulin Pen Needle (SURE COMFORT PEN NEEDLES) 31G X 5 MM MISC, Use as directed, Disp: 30 each, Rfl: 1   lamoTRIgine  (LAMICTAL ) 200 MG tablet, TAKE 1 TABLET BY MOUTH TWICE DAILY, Disp: 60  tablet, Rfl: 10   Lancet Devices (SIMPLE DIAGNOSTICS LANCING DEV) MISC, , Disp: , Rfl:    levocetirizine (XYZAL) 5 MG tablet, Take 5 mg by mouth every evening., Disp: , Rfl:    levothyroxine (SYNTHROID) 137 MCG tablet, Take 137 mcg by mouth daily., Disp: , Rfl:    montelukast (SINGULAIR) 10 MG tablet, TAKE 1 TABLET BY MOUTH EVERY DAY, Disp: 90 tablet, Rfl: 1   pantoprazole (PROTONIX) 40 MG tablet, TAKE 1 TABLET BY MOUTH TWICE  A DAY (Patient taking differently: Take 40 mg by mouth 2 (two) times daily.), Disp: 60 tablet, Rfl: 2   TRESIBA FLEXTOUCH 100 UNIT/ML FlexTouch Pen, SMARTSIG:70 Unit(s) SUB-Q Twice Daily, Disp: , Rfl:    valsartan  (DIOVAN ) 80 MG tablet, Take 1 tablet (80 mg total) by mouth daily., Disp: 90 tablet, Rfl: 0   Vitamin D, Ergocalciferol, (DRISDOL) 50000 UNITS CAPS capsule, Take 50,000 Units by mouth every Tuesday. At bedtime, Disp: , Rfl:    bumetanide (BUMEX) 1 MG tablet, Take 1 mg by mouth daily. (Patient not taking: Reported on 01/22/2023), Disp: , Rfl:    FARXIGA 10 MG TABS tablet, Take 10 mg by mouth daily. (Patient not taking: Reported on 02/07/2024), Disp: , Rfl:    insulin glargine, 2 Unit Dial, (TOUJEO MAX SOLOSTAR) 300 UNIT/ML Solostar Pen, Inject 80 Units into the skin 2 (two) times daily. (Patient not taking: Reported on 05/30/2024), Disp: , Rfl:    spironolactone (ALDACTONE) 25 MG tablet, Take 25 mg by mouth daily. (Patient not taking: Reported on 05/30/2024), Disp: , Rfl:    SYNJARDY XR 25-1000 MG TB24, TAKE 1 TABLET BY MOUTH EVERY DAY IN THE MORNING (Patient not taking: No sig reported), Disp: 30 tablet, Rfl: 2 Medication Side Effects: weight gain and tremor  Family Medical/ Social History: Changes?   no  MENTAL HEALTH EXAM:  There were no vitals taken for this visit.There is no height or weight on file to calculate BMI.  General Appearance: Casual, Neat, Well Groomed and Obese  Eye Contact:  Good  Speech:  Clear and Coherent and Normal Rate  Volume:  Normal  Mood:  Euthymic  Affect:  Congruent  Thought Process:  Goal Directed and Descriptions of Associations: Circumstantial  Orientation:  Full (Time, Place, and Person)  Thought Content: Logical   Suicidal Thoughts:  No  Homicidal Thoughts:  No  Memory:  WNL  Judgement:  Good  Insight:  Good  Psychomotor Activity:  Normal  Concentration:  Concentration: Good and Attention Span: Good  Recall:  Good  Fund of Knowledge: Good   Language: Good  Assets:  Communication Skills Desire for Improvement Financial Resources/Insurance Housing Resilience Transportation  ADL's:  Intact  Cognition: WNL  Prognosis:  Good   DIAGNOSES:    ICD-10-CM   1. Recurrent major depressive disorder, in partial remission (HCC)  F33.41     2. Mixed obsessional thoughts and acts  F42.2     3. Generalized anxiety disorder  F41.1     4. Grief  F43.21      Receiving Psychotherapy: Yes   with Marquette Sites, [facility].  RECOMMENDATIONS:  PDMP reviewed.  Klonopin  last filled 12/10/2023. I provided 20 minutes of face to face time during this encounter, including time spent before and after the visit in records review, medical decision making, counseling pertinent to today's visit, and charting.   She's doing well with the current meds, so no changes will be made.   She'll be starting grief counseling through Hosp[facility] soon.  Continue BuSpar  30 mg, 1 p.o. twice daily. Continue Klonopin  1 mg, 1/2 tid prn. (Or 1/2 during the day and 1 at bedtime.) Continue Luvox  100 mg ,1  pills nightly. Continue Lamictal   200 mg po bid.  Continue vitamin D, add multivitamin and B complex. Fish oil also. Continue CPAP use. Continue therapy with Marquette Sites, [facility]. Return in 3 months.   Marvia Slocumb, PA-C

## 2024-06-26 DIAGNOSIS — Z6841 Body Mass Index (BMI) 40.0 and over, adult: Secondary | ICD-10-CM | POA: Diagnosis not present

## 2024-06-26 DIAGNOSIS — R1013 Epigastric pain: Secondary | ICD-10-CM | POA: Diagnosis not present

## 2024-06-28 DIAGNOSIS — R1013 Epigastric pain: Secondary | ICD-10-CM | POA: Diagnosis not present

## 2024-08-15 DIAGNOSIS — Z6841 Body Mass Index (BMI) 40.0 and over, adult: Secondary | ICD-10-CM | POA: Diagnosis not present

## 2024-08-15 DIAGNOSIS — R1032 Left lower quadrant pain: Secondary | ICD-10-CM | POA: Diagnosis not present

## 2024-08-15 DIAGNOSIS — S8012XA Contusion of left lower leg, initial encounter: Secondary | ICD-10-CM | POA: Diagnosis not present

## 2024-08-16 ENCOUNTER — Encounter: Payer: Self-pay | Admitting: Gastroenterology

## 2024-08-16 DIAGNOSIS — R1032 Left lower quadrant pain: Secondary | ICD-10-CM | POA: Diagnosis not present

## 2024-08-18 DIAGNOSIS — L03116 Cellulitis of left lower limb: Secondary | ICD-10-CM | POA: Diagnosis not present

## 2024-08-18 DIAGNOSIS — S8012XD Contusion of left lower leg, subsequent encounter: Secondary | ICD-10-CM | POA: Diagnosis not present

## 2024-08-18 DIAGNOSIS — Z6841 Body Mass Index (BMI) 40.0 and over, adult: Secondary | ICD-10-CM | POA: Diagnosis not present

## 2024-08-23 DIAGNOSIS — I1310 Hypertensive heart and chronic kidney disease without heart failure, with stage 1 through stage 4 chronic kidney disease, or unspecified chronic kidney disease: Secondary | ICD-10-CM | POA: Diagnosis not present

## 2024-08-23 DIAGNOSIS — K219 Gastro-esophageal reflux disease without esophagitis: Secondary | ICD-10-CM | POA: Diagnosis not present

## 2024-08-23 DIAGNOSIS — D649 Anemia, unspecified: Secondary | ICD-10-CM | POA: Diagnosis not present

## 2024-08-23 DIAGNOSIS — F3341 Major depressive disorder, recurrent, in partial remission: Secondary | ICD-10-CM | POA: Diagnosis not present

## 2024-08-23 DIAGNOSIS — G4733 Obstructive sleep apnea (adult) (pediatric): Secondary | ICD-10-CM | POA: Diagnosis not present

## 2024-08-23 DIAGNOSIS — E782 Mixed hyperlipidemia: Secondary | ICD-10-CM | POA: Diagnosis not present

## 2024-08-23 DIAGNOSIS — Z794 Long term (current) use of insulin: Secondary | ICD-10-CM | POA: Diagnosis not present

## 2024-08-23 DIAGNOSIS — E1136 Type 2 diabetes mellitus with diabetic cataract: Secondary | ICD-10-CM | POA: Diagnosis not present

## 2024-08-23 DIAGNOSIS — N1831 Chronic kidney disease, stage 3a: Secondary | ICD-10-CM | POA: Diagnosis not present

## 2024-08-23 DIAGNOSIS — E1122 Type 2 diabetes mellitus with diabetic chronic kidney disease: Secondary | ICD-10-CM | POA: Diagnosis not present

## 2024-08-23 DIAGNOSIS — I2510 Atherosclerotic heart disease of native coronary artery without angina pectoris: Secondary | ICD-10-CM | POA: Diagnosis not present

## 2024-08-23 DIAGNOSIS — E034 Atrophy of thyroid (acquired): Secondary | ICD-10-CM | POA: Diagnosis not present

## 2024-08-23 DIAGNOSIS — S8012XD Contusion of left lower leg, subsequent encounter: Secondary | ICD-10-CM | POA: Diagnosis not present

## 2024-08-26 ENCOUNTER — Ambulatory Visit (HOSPITAL_BASED_OUTPATIENT_CLINIC_OR_DEPARTMENT_OTHER)
Admission: EM | Admit: 2024-08-26 | Discharge: 2024-08-26 | Disposition: A | Discharge status: Home / Self Care | Attending: Physician Assistant | Admitting: Physician Assistant

## 2024-08-26 ENCOUNTER — Encounter (HOSPITAL_BASED_OUTPATIENT_CLINIC_OR_DEPARTMENT_OTHER): Payer: Self-pay | Admitting: Emergency Medicine

## 2024-08-26 ENCOUNTER — Ambulatory Visit (INDEPENDENT_AMBULATORY_CARE_PROVIDER_SITE_OTHER)
Admit: 2024-08-26 | Discharge: 2024-08-26 | Disposition: A | Discharge status: Home / Self Care | Attending: Physician Assistant | Admitting: Physician Assistant

## 2024-08-26 DIAGNOSIS — M79662 Pain in left lower leg: Secondary | ICD-10-CM | POA: Diagnosis not present

## 2024-08-26 DIAGNOSIS — M7989 Other specified soft tissue disorders: Secondary | ICD-10-CM

## 2024-08-26 DIAGNOSIS — M79605 Pain in left leg: Secondary | ICD-10-CM | POA: Diagnosis not present

## 2024-08-26 DIAGNOSIS — M7732 Calcaneal spur, left foot: Secondary | ICD-10-CM | POA: Diagnosis not present

## 2024-08-26 DIAGNOSIS — M1712 Unilateral primary osteoarthritis, left knee: Secondary | ICD-10-CM | POA: Diagnosis not present

## 2024-08-26 MED ORDER — HYDROCODONE-ACETAMINOPHEN 5-325 MG PO TABS: 0.5000 | ORAL_TABLET | Freq: Three times a day (TID) | ORAL | 0 refills | Status: DC | PRN

## 2024-08-26 MED ORDER — DOXYCYCLINE HYCLATE 100 MG PO CAPS
100.0000 mg | ORAL_CAPSULE | Freq: Two times a day (BID) | ORAL | 0 refills | Status: DC
Start: 2024-08-26 — End: 2024-10-11

## 2024-08-26 NOTE — Discharge Instructions (Signed)
 Your x-ray did not show any broken bones.  I do recommend you go get the ultrasound tomorrow to make sure that there is not a blood clot.  Please go to the [facility] emergency room at 11 AM and tell them that you are there for vascular study.  We are going to have her for an ongoing infection with doxycycline  100 mg twice daily.  Stay out of the sun while on this medication.  Your leg elevated and avoid lots of walking.  I have called in a few doses of hydrocodone  to help with your pain.  This medication is addictive and sedating so try to limit use is much as possible.  Do not combine this with your clonazepam  as it increases the risk of overdose significantly.  If you have any chest pain, shortness of breath, increasing pain you need to be seen immediately.

## 2024-08-26 NOTE — ED Triage Notes (Signed)
 Pt reports she tripped over her cat and hit the metal frame of her bed happened 2 weeks ago, pain is getting worse in left leg she has seen PCP 3 times since it happened, she went back this past Wednesday, she was prescribed  Augmentin on 08/22. She recently was on Ciprofloxacin and Flagyl for possible H.pylori. Pt leg is pinkish/rd in color

## 2024-08-26 NOTE — ED Provider Notes (Signed)
 [facility]    CSN: [id number] Arrival date & time: 08/26/24  1048      History   Chief Complaint Chief Complaint  Patient presents with   Leg Pain    HPI Rebecca Roth is a 63 y.o. female.   Patient presents today with a 12-day history of painful left lower leg.  She reports that she tripped over her cat causing her to fall and she hit her bed frame which resulted in an immediate significant bruise.  She has been seen by her primary care 3 times since the injury and has been encouraged to use elevation and ice to manage symptoms.  At her last visit over a week ago she was noted to have increasing erythema and so was started on Augmentin.  She reports completing this medication but has not improved her erythema.  She reports that the pain is rated 6 at rest but increases to 10 with attempted ambulation, described as throbbing with periodic sharp pains, no alleviating factors identified.  Denies any fever, nausea, vomiting.  She has not had any imaging.  She is having difficulty with her daily activities as result of the pain.    Past Medical History:  Diagnosis Date   Anemia    Anxiety    Asthma    Carotid artery disease (HCC)    Carotid US  07/06/16 [facility]): 50-69% RICA stenosis, < 50% LICA stenosis    Cataract    right eye   Chronic kidney disease (CKD), stage III (moderate) (HCC)    Coronary artery disease    50% mid LCX, otherwise normal coronaries, medical therapy 12/04/15 ([place])   Depression    Diabetes mellitus without complication (HCC)    Frequent headaches    GERD (gastroesophageal reflux disease)    Hyperlipidemia    Hypertension    Hypothyroidism    IBS (irritable bowel syndrome)    Migraine    Swelling    Thyroid disease    Vitamin D deficiency    Wears glasses     Patient Active Problem List   Diagnosis Date Noted   Major depressive disorder, recurrent episode, moderate (HCC) 09/17/2018   Generalized anxiety disorder  09/17/2018   Mixed obsessional thoughts and acts 09/17/2018   Type 2 diabetes, controlled, with neuropathy (HCC) 07/11/2014   Hypothyroidism 07/11/2014   IBS (irritable bowel syndrome) 07/11/2014   Asthma 07/11/2014    Past Surgical History:  Procedure Laterality Date   APPENDECTOMY     CATARACT EXTRACTION W/PHACO Right 06/28/2020   Procedure: CATARACT EXTRACTION PHACO AND INTRAOCULAR LENS PLACEMENT (IOC);  Surgeon: Tobie Baptist, MD;  Location[facility] OR;  Service: Ophthalmology;  Laterality: Right;   CATARACT EXTRACTION W/PHACO Left 11/25/2021   Procedure: CATARACT EXTRACTION AND INTRAOCULAR LENS PLACEMENT (IOC);  Surgeon: Tobie Baptist, MD;  Location: [facility] OR;  Service: Ophthalmology;  Laterality: Left;   CHOLECYSTECTOMY     GALLBLADDER SURGERY     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      Home Medications    Prior to Admission medications   Medication Sig Start Date End Date Taking? Authorizing Provider  busPIRone  (BUSPAR ) 30 MG tablet TAKE 1 TABLET BY MOUTH TWICE DAILY 03/07/24  Yes Rhys Boyer T, PA-C  carvedilol (COREG) 6.25 MG tablet Take 6.25 mg by mouth 2 (two) times daily. 04/18/21  Yes [provider]  doxycycline  (VIBRAMYCIN ) 100 MG capsule Take 1 capsule (100 mg total) by mouth 2 (two) times daily. 08/26/24  Yes Mima Cranmore K, PA-C  etodolac (LODINE) 400 MG tablet TAKE 1 TABLET BY MOUTH TWICE A DAY WITH FOOD 06/04/20  Yes Cox, Kirsten, MD  fenofibrate 54 MG tablet TAKE 1 TABLET BY MOUTH EVERY DAY 06/17/20  Yes Cox, Kirsten, MD  fluvoxaMINE  (LUVOX ) 100 MG tablet TAKE 1.5 TABLETS BY MOUTH AT BEDTIME. Patient taking differently: Take 100 mg by mouth at bedtime. 05/09/24  Yes Rhys Boyer T,  PA-C  furosemide (LASIX) 40 MG tablet Take 40 mg by mouth daily.   Yes [provider]  HYDROcodone -acetaminophen  (NORCO/VICODIN) 5-325 MG tablet Take 0.5-1 tablets by mouth 3 (three) times daily as needed. 08/26/24  Yes Jazzlyn Huizenga K, PA-C  lamoTRIgine  (LAMICTAL ) 200 MG tablet TAKE 1 TABLET BY MOUTH TWICE DAILY 01/05/24  Yes Hurst, Teresa T, PA-C  levothyroxine (SYNTHROID) 137 MCG tablet Take 137 mcg by mouth daily. 04/03/20  Yes [provider]  montelukast (SINGULAIR) 10 MG tablet TAKE 1 TABLET BY MOUTH EVERY DAY 05/05/20  Yes Cox, Kirsten, MD  pantoprazole (PROTONIX) 40 MG tablet TAKE 1 TABLET BY MOUTH TWICE A DAY Patient taking differently: Take 40 mg by mouth 2 (two) times daily. 06/17/20  Yes Cox, Kirsten, MD  TRESIBA FLEXTOUCH 100 UNIT/ML FlexTouch Pen SMARTSIG:70 Unit(s) SUB-Q Twice Daily 05/25/24  Yes [provider]  valsartan  (DIOVAN ) 80 MG tablet Take 1 tablet (80 mg total) by mouth daily. 03/29/20  Yes Cox, Kirsten, MD  Vitamin D, Ergocalciferol, (DRISDOL) 50000 UNITS CAPS capsule Take 50,000 Units by mouth every Tuesday. At bedtime 07/07/14  Yes [provider]  aspirin EC 81 MG tablet Take 81 mg by mouth daily. Swallow whole.    [provider]  atorvastatin  (LIPITOR) 40 MG tablet Take 1 tablet (40 mg total) by mouth daily. Patient taking differently: Take 40 mg by mouth at bedtime. 05/06/20   Nicholaus Credit, PA-C  BAYER CONTOUR TEST test strip  06/20/14   [provider]  bumetanide (BUMEX) 1 MG tablet Take 1 mg by mouth daily. Patient not taking: Reported on 01/22/2023 09/28/22   [provider]  clonazePAM  (KLONOPIN ) 1 MG tablet Take 0.5 tablets (0.5 mg total) by mouth 3 (three) times daily as needed for anxiety. 12/10/23   Rhys Boyer T, PA-C  FARXIGA 10 MG TABS tablet Take 10 mg by mouth daily. Patient not taking: Reported on 02/07/2024 09/09/22   [provider]  insulin glargine, 2 Unit Dial, (TOUJEO MAX SOLOSTAR) 300  UNIT/ML Solostar Pen Inject 80 Units into the skin 2 (two) times daily. Patient not taking: Reported on 05/30/2024    [provider]  Insulin Pen Needle (SURE COMFORT PEN NEEDLES) 31G X 5 MM MISC Use as directed 02/14/20   Nicholaus Credit, PA-C  Lancet Devices (SIMPLE DIAGNOSTICS LANCING DEV) MISC  05/04/14   [provider]  levocetirizine (XYZAL) 5 MG tablet Take 5 mg by mouth every evening.    [provider]  spironolactone (ALDACTONE) 25 MG tablet Take 25 mg by mouth daily. 07/23/23   [provider]  SYNJARDY XR 25-1000 MG TB24 TAKE 1 TABLET BY MOUTH EVERY DAY IN THE MORNING Patient not taking: No sig reported 06/04/20   Sherre Clapper, MD    Family History  Family History  Problem Relation Age of Onset   Stroke Mother    Heart disease Mother    Anemia Mother    Diabetes Mother    Heart attack Mother    Stroke Father    Diabetes Father    Pancreatic cancer Father    Arthritis Father    Hyperlipidemia Father    Diabetes Sister    Diabetes Brother    Kidney cancer Brother    Hypertension Brother    Cancer Brother    Emphysema Maternal Grandfather     Social History Social History   Tobacco Use   Smoking status: Never   Smokeless tobacco: Never  Vaping Use   Vaping status: Never Used  Substance Use Topics   Alcohol use: No   Drug use: No     Allergies   Artane  [trihexyphenidyl ], Risperdal  [risperidone ], Ace inhibitors, and Sulfa antibiotics   Review of Systems Review of Systems  Constitutional:  Positive for activity change. Negative for appetite change, fatigue and fever.  Respiratory:  Negative for shortness of breath.   Cardiovascular:  Positive for leg swelling. Negative for chest pain and palpitations.  Gastrointestinal:  Negative for abdominal pain, diarrhea, nausea and vomiting.  Musculoskeletal:  Positive for arthralgias, gait problem and myalgias.  Skin:  Positive for color change and wound.  Neurological:  Negative for  weakness and numbness.     Physical Exam Triage Vital Signs ED Triage Vitals  Encounter Vitals Group     BP 08/26/24 1120 (!) 151/79     Girls Systolic BP Percentile --      Girls Diastolic BP Percentile --      Boys Systolic BP Percentile --      Boys Diastolic BP Percentile --      Pulse Rate 08/26/24 1120 65     Resp 08/26/24 1120 18     Temp 08/26/24 1120 98.3 F (36.8 C)     Temp Source 08/26/24 1120 Oral     SpO2 08/26/24 1120 97 %     Weight --      Height --      Head Circumference --      Peak Flow --      Pain Score 08/26/24 1117 6     Pain Loc --      Pain Education --      Exclude from Growth Chart --    No data found.  Updated Vital Signs BP (!) 151/79 (BP Location: Right Arm)   Pulse 65   Temp 98.3 F (36.8 C) (Oral)   Resp 18   SpO2 97%   Visual Acuity Right Eye Distance:   Left Eye Distance:   Bilateral Distance:    Right Eye Near:   Left Eye Near:    Bilateral Near:     Physical Exam Vitals reviewed.  Constitutional:      General: She is awake. She is not in acute distress.    Appearance: Normal appearance. She is well-developed. She is not ill-appearing.     Comments: Very pleasant female appears stated age in no acute distress sitting comfortably in exam room  HENT:     Head: Normocephalic and atraumatic.  Cardiovascular:     Rate and Rhythm: Normal rate and regular rhythm.     Heart sounds: Normal heart sounds, S1 normal and S2 normal. No murmur heard.    Comments: Right leg measures 41 cm and left leg measures 45 cm.  Negative Homans' sign. Pulmonary:  Effort: Pulmonary effort is normal.     Breath sounds: Normal breath sounds. No wheezing, rhonchi or rales.     Comments: Clear to auscultation bilaterally Musculoskeletal:     Right lower leg: No edema.     Left lower leg: Tenderness present. No deformity or bony tenderness. 2+ Edema present.       Legs:     Comments: Area measuring 19 x 24 cm with central induration and  ecchymosis and surrounding erythema.  No streaking or evidence of lymphangitis.  Significant tenderness palpation over affected region.  No deformity noted.  Psychiatric:        Behavior: Behavior is cooperative.      UC Treatments / Results  Labs (all labs ordered are listed, but only abnormal results are displayed) Labs Reviewed - No data to display  EKG   Radiology DG Tibia/Fibula Left Result Date: 08/26/2024 EXAM: 3 VIEW(S) XRAY OF THE LEFT TIBIA AND FIBULA 08/26/2024 12:14:00 PM COMPARISON: 07/11/2014 CLINICAL HISTORY: Pain and swelling after falling 2 weeks ago. Pt reports she tripped over her cat and hit the metal frame of her bed happened 2 weeks ago, pain is getting worse in left leg she has seen PCP 3 times since it happened, she went back this past Wednesday, she was prescribed Augmentin on 08/22. She recently was on Ciprofloxacin and Flagyl for possible H.pylori. Pt leg is pinkish/red in color. FINDINGS: BONES AND JOINTS: No acute fracture. No focal osseous lesion. No joint dislocation. Narrowing of articular cartilage in the medial and lateral compartments of the knee. Small marginal spurs from the patellar articular surface and lateral tibial plateau. Small calcaneal spur. SOFT TISSUES: The soft tissues are unremarkable. IMPRESSION: 1. No acute fracture or dislocation. 2. Mild degenerative changes as above. Electronically signed by: Dayne Hassell MD 08/26/2024 12:20 PM EDT RP Workstation: [id number]    Procedures Procedures (including critical care time)  Medications Ordered in UC Medications - No data to display  Initial Impression / Assessment and Plan / UC Course  I have reviewed the triage vital signs and the nursing notes.  Pertinent labs & imaging results that were available during my care of the patient were reviewed by me and considered in my medical decision making (see chart for details).     Patient is well-appearing, afebrile, nontoxic, nontachycardic.  X-ray  was obtained that showed no acute osseous abnormality.  Unclear etiology of symptoms but I suspect she has a significant hematoma that is causing her symptoms.  Low suspicion for compartment syndrome as she does not have the woody texture to use the area and symptoms have been present for many days without evidence of ischemia.  Will obtain ultrasound to rule out DVT triggered by recent trauma.  We will contact her if this is positive and arrange additional treatment.  She is to keep the area elevated.  We discussed that is possible that she has ongoing cellulitis that is resistant to Augmentin so we will start doxycycline  and we discussed that she should avoid prolonged sun exposure while on this medication due to associated photosensitivity.  She does have a history of chronic kidney disease with metabolic panel last obtained 10/12/2022 with a creatinine of 1.32 and calculated creatinine clearance of 79 mL/min.  She has been taking high-dose NSAIDs over-the-counter without improvement of symptoms so she was given a short course of hydrocodone .  Review of [place]  controlled substance database shows no inappropriate refills.  She does have clonazepam  listed on her chart but  reports that she has taken 2 pills since January and so was instructed not to combine this with hydrocodone  as it increases the risk of overdose.  We discussed that if her symptoms worsen she should go to the emergency room for further evaluation and management.  All questions were answered to patient satisfaction.  Final Clinical Impressions(s) / UC Diagnoses   Final diagnoses:  Left leg pain  Pain and swelling of left lower leg     Discharge Instructions      Your x-ray did not show any broken bones.  I do recommend you go get the ultrasound tomorrow to make sure that there is not a blood clot.  Please go to the [facility] emergency room at 11 AM and tell them that you are there for vascular study.  We are going to have her  for an ongoing infection with doxycycline  100 mg twice daily.  Stay out of the sun while on this medication.  Your leg elevated and avoid lots of walking.  I have called in a few doses of hydrocodone  to help with your pain.  This medication is addictive and sedating so try to limit use is much as possible.  Do not combine this with your clonazepam  as it increases the risk of overdose significantly.  If you have any chest pain, shortness of breath, increasing pain you need to be seen immediately.     ED Prescriptions     Medication Sig Dispense Auth. Provider   doxycycline  (VIBRAMYCIN ) 100 MG capsule Take 1 capsule (100 mg total) by mouth 2 (two) times daily. 20 capsule Shaylah Mcghie K, PA-C   HYDROcodone -acetaminophen  (NORCO/VICODIN) 5-325 MG tablet Take 0.5-1 tablets by mouth 3 (three) times daily as needed. 9 tablet Virgie Kunda K, PA-C      I have reviewed the PDMP during this encounter.   Sherrell Rocky POUR, PA-C 08/26/24 1246

## 2024-08-27 ENCOUNTER — Ambulatory Visit (HOSPITAL_COMMUNITY): Admission: RE | Admit: 2024-08-27 | Source: Ambulatory Visit

## 2024-08-30 ENCOUNTER — Ambulatory Visit: Admitting: Physician Assistant

## 2024-08-30 ENCOUNTER — Encounter: Payer: Self-pay | Admitting: Physician Assistant

## 2024-08-30 DIAGNOSIS — F422 Mixed obsessional thoughts and acts: Secondary | ICD-10-CM

## 2024-08-30 DIAGNOSIS — F4323 Adjustment disorder with mixed anxiety and depressed mood: Secondary | ICD-10-CM

## 2024-08-30 MED ORDER — MEMANTINE HCL 5 MG PO TABS: ORAL_TABLET | ORAL | 1 refills | Status: DC

## 2024-08-30 NOTE — Progress Notes (Signed)
 [facility]  Patient ID: Rebecca Roth,  MRN: [id number]  PCP: Pcp, No  Date of Evaluation: 08/30/2024  Time spent:20 minutes  Chief Complaint:  Chief Complaint   Anxiety; Depression; Follow-up    HISTORY/CURRENT STATUS: HPI  For routine med f/u  Overall Rebecca Roth is doing well. Still gets anxious and takes the Klonopin  on occasion and it helps.  It's rare. Her dtr still gives her are a hard time acting like she's addicted. Not having PA like she did several years ago.  Buspar  has helped prevent the anxiety.  Still ruminates on things but not as bad as in the past.    She is able to enjoy things.  Energy and motivation are good.   No extreme sadness, tearfulness, or feelings of hopelessness.  Sleeps ok. ADLs and personal hygiene are normal.   Denies any changes in concentration, making decisions, or remembering things.  Appetite has not changed.  Weight is stable.  No mania, delirium, AH/VH.  No SI/HI.  Individual Medical History/ Review of Systems: Changes? :No    Past medications for mental health diagnoses include: Cymbalta, Depakote, Effexor, Celexa Wellbutrin, Abilify, Zoloft wasn't helpful for depression or OCD, Lexapro?, Prozac  quit working, Buspar , Klonopin , Rexulti  was effective but too expensive. Risperdal  caused falls, Artane  caused Falls, seroquel  for sleep  Allergies: Artane  [trihexyphenidyl ], Risperdal  [risperidone ], Ace inhibitors, and Sulfa antibiotics  Current Medications:  Current Outpatient Medications:    aspirin EC 81 MG tablet, Take 81 mg by mouth daily. Swallow whole., Disp: , Rfl:    atorvastatin  (LIPITOR) 40 MG tablet, Take 1 tablet (40 mg total) by mouth daily., Disp: 30 tablet, Rfl: 2   BAYER CONTOUR TEST test strip, , Disp: , Rfl:    bumetanide (BUMEX) 1 MG tablet, Take 1 mg by mouth daily., Disp: , Rfl:    carvedilol (COREG) 6.25 MG tablet, Take 6.25 mg by mouth 2 (two) times daily., Disp: , Rfl:    clonazePAM  (KLONOPIN ) 1 MG tablet,  Take 0.5 tablets (0.5 mg total) by mouth 3 (three) times daily as needed for anxiety., Disp: 90 tablet, Rfl: 0   doxycycline  (VIBRAMYCIN ) 100 MG capsule, Take 1 capsule (100 mg total) by mouth 2 (two) times daily., Disp: 20 capsule, Rfl: 0   etodolac (LODINE) 400 MG tablet, TAKE 1 TABLET BY MOUTH TWICE A DAY WITH FOOD, Disp: 60 tablet, Rfl: 2   FARXIGA 10 MG TABS tablet, Take 10 mg by mouth daily., Disp: , Rfl:    fenofibrate 54 MG tablet, TAKE 1 TABLET BY MOUTH EVERY DAY, Disp: 30 tablet, Rfl: 2   fluvoxaMINE  (LUVOX ) 100 MG tablet, TAKE 1.5 TABLETS BY MOUTH AT BEDTIME. (Patient taking differently: Take 50 mg by mouth at bedtime.), Disp: 45 tablet, Rfl: 1   furosemide (LASIX) 40 MG tablet, Take 40 mg by mouth daily., Disp: , Rfl:    HYDROcodone -acetaminophen  (NORCO/VICODIN) 5-325 MG tablet, Take 0.5-1 tablets by mouth 3 (three) times daily as needed., Disp: 9 tablet, Rfl: 0   Insulin Pen Needle (SURE COMFORT PEN NEEDLES) 31G X 5 MM MISC, Use as directed, Disp: 30 each, Rfl: 1   lamoTRIgine  (LAMICTAL ) 200 MG tablet, TAKE 1 TABLET BY MOUTH TWICE DAILY, Disp: 60 tablet, Rfl: 10   Lancet Devices (SIMPLE DIAGNOSTICS LANCING DEV) MISC, , Disp: , Rfl:    levocetirizine (XYZAL) 5 MG tablet, Take 5 mg by mouth every evening., Disp: , Rfl:    levothyroxine (SYNTHROID) 137 MCG tablet, Take 137 mcg by mouth daily., Disp: ,  Rfl:    memantine  (NAMENDA ) 5 MG tablet, 1 po at bedtime for 1 week, then increase to bid., Disp: 60 tablet, Rfl: 1   montelukast (SINGULAIR) 10 MG tablet, TAKE 1 TABLET BY MOUTH EVERY DAY, Disp: 90 tablet, Rfl: 1   pantoprazole (PROTONIX) 40 MG tablet, TAKE 1 TABLET BY MOUTH TWICE A DAY, Disp: 60 tablet, Rfl: 2   spironolactone (ALDACTONE) 25 MG tablet, Take 25 mg by mouth daily., Disp: , Rfl:    SYNJARDY XR 25-1000 MG TB24, TAKE 1 TABLET BY MOUTH EVERY DAY IN THE MORNING, Disp: 30 tablet, Rfl: 2   TRESIBA FLEXTOUCH 100 UNIT/ML FlexTouch Pen, SMARTSIG:70 Unit(s) SUB-Q Twice Daily, Disp: ,  Rfl:    valsartan  (DIOVAN ) 80 MG tablet, Take 1 tablet (80 mg total) by mouth daily., Disp: 90 tablet, Rfl: 0   Vitamin D, Ergocalciferol, (DRISDOL) 50000 UNITS CAPS capsule, Take 50,000 Units by mouth every Tuesday. At bedtime, Disp: , Rfl:    busPIRone  (BUSPAR ) 30 MG tablet, TAKE 1 TABLET BY MOUTH TWICE DAILY, Disp: 60 tablet, Rfl: 1   insulin glargine, 2 Unit Dial, (TOUJEO MAX SOLOSTAR) 300 UNIT/ML Solostar Pen, Inject 80 Units into the skin 2 (two) times daily. (Patient not taking: Reported on 05/30/2024), Disp: , Rfl:  Medication Side Effects: weight gain and tremor  Family Medical/ Social History: Changes?   no  MENTAL HEALTH EXAM:  There were no vitals taken for this visit.There is no height or weight on file to calculate BMI.  General Appearance: Casual and Well Groomed  Eye Contact:  Good  Speech:  Clear and Coherent and Normal Rate  Volume:  Normal  Mood:  Euthymic  Affect:  Congruent  Thought Process:  Goal Directed and Descriptions of Associations: Circumstantial  Orientation:  Full (Time, Place, and Person)  Thought Content: Logical   Suicidal Thoughts:  No  Homicidal Thoughts:  No  Memory:  WNL  Judgement:  Good  Insight:  Good  Psychomotor Activity:  Normal  Concentration:  Concentration: Good and Attention Span: Good  Recall:  Good  Fund of Knowledge: Good  Language: Good  Assets:  Communication Skills Desire for Improvement Financial Resources/Insurance Housing Resilience Transportation  ADL's:  Intact  Cognition: WNL  Prognosis:  Good   DIAGNOSES:    ICD-10-CM   1. Mixed obsessional thoughts and acts  F42.2     2. Situational mixed anxiety and depressive disorder  F43.23       Receiving Psychotherapy: Yes   with Medford Fischer, [facility].  RECOMMENDATIONS:  PDMP reviewed.  Klonopin  last filled 12/10/2023. I provided approximately 20 minutes of face to face time during this encounter, including time spent before and after the visit in records review,  medical decision making, counseling pertinent to today's visit, and charting.   Rebecca Roth is doing well on her current meds so no changes are needed. We discussed the Klonopin .  She gets it filled rarely and I have no concerns of addiction or tolerance.   Continue BuSpar  30 mg, 1 p.o. twice daily. Continue Klonopin  1 mg, 1/2 tid prn. (Or 1/2 during the day and 1 at bedtime.) Cont Luvox  50 mg at bedtime.  Continue Lamictal   200 mg po bid.  Continue vitamin D, add multivitamin and B complex. Fish oil also. Continue CPAP use. Continue therapy with Medford Fischer, [facility]. Return in 5-6 weeks.  Verneita Cooks, PA-C

## 2024-08-31 ENCOUNTER — Other Ambulatory Visit: Payer: Self-pay | Admitting: Physician Assistant

## 2024-09-21 ENCOUNTER — Other Ambulatory Visit: Payer: Self-pay | Admitting: Physician Assistant

## 2024-09-25 DIAGNOSIS — Z0000 Encounter for general adult medical examination without abnormal findings: Secondary | ICD-10-CM | POA: Diagnosis not present

## 2024-09-25 DIAGNOSIS — Z6841 Body Mass Index (BMI) 40.0 and over, adult: Secondary | ICD-10-CM | POA: Diagnosis not present

## 2024-09-25 DIAGNOSIS — Z23 Encounter for immunization: Secondary | ICD-10-CM | POA: Diagnosis not present

## 2024-10-05 DIAGNOSIS — I10 Essential (primary) hypertension: Secondary | ICD-10-CM | POA: Diagnosis not present

## 2024-10-09 ENCOUNTER — Ambulatory Visit: Payer: Self-pay | Admitting: Gastroenterology

## 2024-10-11 ENCOUNTER — Ambulatory Visit: Admitting: Physician Assistant

## 2024-10-11 ENCOUNTER — Encounter: Payer: Self-pay | Admitting: Physician Assistant

## 2024-10-11 ENCOUNTER — Ambulatory Visit (INDEPENDENT_AMBULATORY_CARE_PROVIDER_SITE_OTHER): Admitting: Mental Health

## 2024-10-11 DIAGNOSIS — F422 Mixed obsessional thoughts and acts: Secondary | ICD-10-CM | POA: Diagnosis not present

## 2024-10-11 DIAGNOSIS — F4323 Adjustment disorder with mixed anxiety and depressed mood: Secondary | ICD-10-CM | POA: Diagnosis not present

## 2024-10-11 DIAGNOSIS — G4733 Obstructive sleep apnea (adult) (pediatric): Secondary | ICD-10-CM | POA: Diagnosis not present

## 2024-10-11 MED ORDER — FLUVOXAMINE MALEATE 50 MG PO TABS: 50.0000 mg | ORAL_TABLET | Freq: Every day | ORAL | 1 refills | Status: DC

## 2024-10-11 MED ORDER — CLOMIPRAMINE HCL 25 MG PO CAPS: 25.0000 mg | ORAL_CAPSULE | Freq: Every day | ORAL | 1 refills | Status: DC

## 2024-10-11 NOTE — Progress Notes (Signed)
 [facility] Counselor/Therapist Progress Note     Patient ID: Rebecca Roth, MRN: [id number]   Date:   10/11/24   Timespent: 50 minutes   Treatment Type: Individual   Mental Status Exam:        Appearance:    Casual     Behavior:   Appropriate  Motor:   Normal  Speech/Language:    Normal Rate  Affect:   Full Range  Mood:   Euthymic  Thought process:   Coherent and Relevant  Thought content:     Logical  Perceptual disturbances:     Normal  Orientation:   Full (Time, Place, and Person)  Attention:   Good  Concentration:   good  Memory:    Intact  Fund of knowledge:    Good  Insight:     Good  Judgment:    Good  Impulse Control:   good     Reported Symptoms:  anxiety,  deficits with attention and concentration, history of panic attacks   Risk Assessment: Danger to Self:  No Self-injurious Behavior: No Danger to Others: No Duty to Warn:no Physical Aggression / Violence:No  Access to Firearms a concern: No  Gang Involvement:No  Patient / guardian was educated about steps to take if suicide or homicide risk level increases between visits. While future psychiatric events cannot be accurately predicted, the patient does not currently require acute inpatient psychiatric care and does not currently meet [place]  involuntary commitment criteria.   Subjective: Patient shared progress since last visit.  Assess progress since last visit which was approximately 6 months ago. She shared how she continues to adjust the loss of her husband who passed earlier this year. She shared how she is cooked, provide support and understanding facilitating her identifying and processing feelings related. She continues to navigate the relationships within our household, living with her daughter-in-law and son. She shared how he often is overbearing with strong opinions and can be critical. She recognized the need to maintain boundaries in this relationship and this was further  explored collaboratively and ways to follow through. She identified thoughts toward maintaining these boundaries interpersonally to keep her stress in anxiety and low.   Interventions:CBT, Solution Focused and Strength-based   Diagnosis:     ICD-10-CM    1. Recurrent major depressive disorder, in full remission (HCC)  F33.42             Plan:  1.  Patient to continue to engage in individual counseling 2-4 times a month or as needed. 2.  Patient to identify and apply CBT, coping skills learned in session to decrease depression and anxiety symptoms. 3.  Patient to improve effective communication and assertiveness with others. 4.  Patient to decrease obsessive compulsive behaviors with systematic desensitization as discussed in session. 5.  Patient to contact this office, go to the local ED or call 911 if a crisis or emergency develops between visits.   Lonni Fischer, [facility]

## 2024-10-11 NOTE — Progress Notes (Signed)
 [facility]  Patient ID: Rebecca Roth,  MRN: [id number]  PCP: Vicci Odor, PA  Date of Evaluation: 10/11/2024  Time spent:20 minutes  Chief Complaint:  Chief Complaint   Depression; Anxiety; Insomnia; Follow-up    HISTORY/CURRENT STATUS: HPI  For routine med f/u  Something is making her very sleepy. Going on for 'a little while.  I think something is too strong.' She'll fall asleep in the middle of things, like with a remote in her hand, and she'll wake up a few hours later with it still in her hand.  Not sure if it's from the Luvox  or not. Would like to try decreasing it. Still counting everything, which is very annoying and would like help with it if possible. Uses CPAP religiously.   No sx of depression.  Patient is able to enjoy things.  No extreme sadness, tearfulness, or feelings of hopelessness.  ADLs and personal hygiene are normal.   Denies any changes in concentration, making decisions, or remembering things.  Appetite has not changed.  Weight is stable.  Rarely has anxiety, rarely takes the Klonopin  but it is effective when needed. No mania, delirium, AH/VH.  No SI/HI.  Individual Medical History/ Review of Systems: Changes? :No    Past medications for mental health diagnoses include: Cymbalta, Depakote, Effexor, Celexa Wellbutrin, Abilify, Zoloft wasn't helpful for depression or OCD, Lexapro?, Prozac  quit working, Buspar , Klonopin , Rexulti  was effective but too expensive. Risperdal  caused falls, Artane  caused Falls, seroquel  for sleep, Memantine  for OCD was ineffective.   Allergies: Artane  [trihexyphenidyl ], Risperdal  [risperidone ], Ace inhibitors, and Sulfa antibiotics  Current Medications:  Current Outpatient Medications:    aspirin EC 81 MG tablet, Take 81 mg by mouth daily. Swallow whole., Disp: , Rfl:    atorvastatin  (LIPITOR) 40 MG tablet, Take 1 tablet (40 mg total) by mouth daily., Disp: 30 tablet, Rfl: 2   BAYER CONTOUR TEST test  strip, , Disp: , Rfl:    busPIRone  (BUSPAR ) 30 MG tablet, TAKE 1 TABLET BY MOUTH TWICE DAILY, Disp: 60 tablet, Rfl: 1   carvedilol (COREG) 6.25 MG tablet, Take 6.25 mg by mouth 2 (two) times daily., Disp: , Rfl:    clomiPRAMINE (ANAFRANIL) 25 MG capsule, Take 1 capsule (25 mg total) by mouth at bedtime., Disp: 30 capsule, Rfl: 1   clonazePAM  (KLONOPIN ) 1 MG tablet, Take 0.5 tablets (0.5 mg total) by mouth 3 (three) times daily as needed for anxiety., Disp: 90 tablet, Rfl: 0   etodolac (LODINE) 400 MG tablet, TAKE 1 TABLET BY MOUTH TWICE A DAY WITH FOOD, Disp: 60 tablet, Rfl: 2   FARXIGA 10 MG TABS tablet, Take 10 mg by mouth daily., Disp: , Rfl:    fenofibrate 54 MG tablet, TAKE 1 TABLET BY MOUTH EVERY DAY, Disp: 30 tablet, Rfl: 2   fluvoxaMINE  (LUVOX ) 50 MG tablet, Take 1 tablet (50 mg total) by mouth at bedtime., Disp: 30 tablet, Rfl: 1   furosemide (LASIX) 40 MG tablet, Take 40 mg by mouth daily., Disp: , Rfl:    lamoTRIgine  (LAMICTAL ) 200 MG tablet, TAKE 1 TABLET BY MOUTH TWICE DAILY, Disp: 60 tablet, Rfl: 10   levocetirizine (XYZAL) 5 MG tablet, Take 5 mg by mouth every evening., Disp: , Rfl:    levothyroxine (SYNTHROID) 137 MCG tablet, Take 137 mcg by mouth daily., Disp: , Rfl:    montelukast (SINGULAIR) 10 MG tablet, TAKE 1 TABLET BY MOUTH EVERY DAY, Disp: 90 tablet, Rfl: 1   pantoprazole (PROTONIX) 40 MG tablet, TAKE  1 TABLET BY MOUTH TWICE A DAY, Disp: 60 tablet, Rfl: 2   spironolactone (ALDACTONE) 25 MG tablet, Take 25 mg by mouth daily., Disp: , Rfl:    SYNJARDY XR 25-1000 MG TB24, TAKE 1 TABLET BY MOUTH EVERY DAY IN THE MORNING, Disp: 30 tablet, Rfl: 2   TRESIBA FLEXTOUCH 100 UNIT/ML FlexTouch Pen, SMARTSIG:70 Unit(s) SUB-Q Twice Daily, Disp: , Rfl:    valsartan  (DIOVAN ) 80 MG tablet, Take 1 tablet (80 mg total) by mouth daily., Disp: 90 tablet, Rfl: 0   Vitamin D, Ergocalciferol, (DRISDOL) 50000 UNITS CAPS capsule, Take 50,000 Units by mouth every Tuesday. At bedtime, Disp: , Rfl:     insulin glargine, 2 Unit Dial, (TOUJEO MAX SOLOSTAR) 300 UNIT/ML Solostar Pen, Inject 80 Units into the skin 2 (two) times daily. (Patient not taking: Reported on 10/11/2024), Disp: , Rfl:    Insulin Pen Needle (SURE COMFORT PEN NEEDLES) 31G X 5 MM MISC, Use as directed, Disp: 30 each, Rfl: 1   Lancet Devices (SIMPLE DIAGNOSTICS LANCING DEV) MISC, , Disp: , Rfl:  Medication Side Effects: weight gain and tremor  Family Medical/ Social History: Changes?   no  MENTAL HEALTH EXAM:  There were no vitals taken for this visit.There is no height or weight on file to calculate BMI.  General Appearance: Casual and Well Groomed  Eye Contact:  Good  Speech:  Clear and Coherent and Normal Rate  Volume:  Normal  Mood:  Euthymic  Affect:  Congruent  Thought Process:  Goal Directed and Descriptions of Associations: Circumstantial  Orientation:  Full (Time, Place, and Person)  Thought Content: Logical   Suicidal Thoughts:  No  Homicidal Thoughts:  No  Memory:  WNL  Judgement:  Good  Insight:  Good  Psychomotor Activity:  Normal  Concentration:  Concentration: Good and Attention Span: Good  Recall:  Good  Fund of Knowledge: Good  Language: Good  Assets:  Communication Skills Desire for Improvement Financial Resources/Insurance Housing Leisure Time Resilience Transportation  ADL's:  Intact  Cognition: WNL  Prognosis:  Good   PCP follows labs.  DIAGNOSES:    ICD-10-CM   1. Mixed obsessional thoughts and acts  F42.2     2. Situational mixed anxiety and depressive disorder  F43.23     3. Obstructive sleep apnea  G47.33       Receiving Psychotherapy: Yes   with Medford Fischer, [facility].  RECOMMENDATIONS:  PDMP reviewed.  Klonopin  last filled 12/10/2023.  Small amount of hydrocodone  filled 08/26/2024. I provided approximately  20 minutes of face to face time during this encounter, including time spent before and after the visit in records review, medical decision making, counseling  pertinent to today's visit, and charting.   We discussed the OCD.  The Namenda  is not working so we will wean off that, take 5mg , daily for 1 week and then stop.  I recommend adding clomipramine.  That along with the Luvox  can be very effective.  Benefits, risk and side effects were discussed and she accepts.  Discussed sleep hygiene.  Hopefully with decreasing the Luvox  that will help the sedation.  Continue BuSpar  30 mg, 1 p.o. twice daily. Start clomipramine 25 mg, 1 p.o. nightly. Continue Klonopin  1 mg, 1/2 tid prn. (Or 1/2 during the day and 1 at bedtime.) Change Luvox  to a total of 75 mg daily for 1 week and then down to 50 mg.  (She understands.  Once her current supply is gone then she will get the 50 mg  pills and take 1 every night.).   Continue Lamictal   200 mg po bid.  Continue vitamin D, add multivitamin and B complex. Fish oil also. Continue CPAP use. Continue therapy with Medford Fischer, [facility]. Return in 4 to 6 weeks.  Verneita Cooks, PA-C

## 2024-10-16 ENCOUNTER — Other Ambulatory Visit: Payer: Self-pay | Admitting: Physician Assistant

## 2024-10-20 ENCOUNTER — Telehealth: Payer: Self-pay | Admitting: Physician Assistant

## 2024-10-20 NOTE — Telephone Encounter (Signed)
 Pt called has questions about meds from last apt. Verify taking correctly. Call [phone]

## 2024-10-20 NOTE — Telephone Encounter (Signed)
 Reviewed medications with patient. She said she did not get an AVS and she had questions about dosing of clomipramine and Luvox .

## 2024-10-29 ENCOUNTER — Other Ambulatory Visit: Payer: Self-pay | Admitting: Physician Assistant

## 2024-11-01 ENCOUNTER — Other Ambulatory Visit: Payer: Self-pay | Admitting: Physician Assistant

## 2024-11-01 ENCOUNTER — Ambulatory Visit: Payer: Self-pay | Admitting: Gastroenterology

## 2024-11-09 ENCOUNTER — Other Ambulatory Visit: Payer: Self-pay | Admitting: Physician Assistant

## 2024-11-13 ENCOUNTER — Ambulatory Visit

## 2024-11-15 ENCOUNTER — Encounter: Payer: Self-pay | Admitting: Physician Assistant

## 2024-11-15 ENCOUNTER — Ambulatory Visit (INDEPENDENT_AMBULATORY_CARE_PROVIDER_SITE_OTHER): Admitting: Physician Assistant

## 2024-11-15 DIAGNOSIS — G4733 Obstructive sleep apnea (adult) (pediatric): Secondary | ICD-10-CM | POA: Diagnosis not present

## 2024-11-15 DIAGNOSIS — F3341 Major depressive disorder, recurrent, in partial remission: Secondary | ICD-10-CM | POA: Diagnosis not present

## 2024-11-15 DIAGNOSIS — F411 Generalized anxiety disorder: Secondary | ICD-10-CM

## 2024-11-15 DIAGNOSIS — F422 Mixed obsessional thoughts and acts: Secondary | ICD-10-CM | POA: Diagnosis not present

## 2024-11-15 MED ORDER — MEMANTINE HCL 5 MG PO TABS
5.0000 mg | ORAL_TABLET | Freq: Every day | ORAL | 3 refills | Status: AC
  Filled 2024-12-25 – 2025-01-11 (×2): qty 30, 30d supply, fill #0

## 2024-11-15 MED ORDER — LAMOTRIGINE 200 MG PO TABS: 200.0000 mg | ORAL_TABLET | Freq: Two times a day (BID) | ORAL | 3 refills | Status: AC

## 2024-11-15 MED ORDER — CLOMIPRAMINE HCL 25 MG PO CAPS
25.0000 mg | ORAL_CAPSULE | Freq: Every day | ORAL | 3 refills | Status: AC
  Filled 2024-12-25: qty 30, 30d supply, fill #0
  Filled 2025-01-04: qty 90, 90d supply, fill #0

## 2024-11-15 MED ORDER — BUSPIRONE HCL 30 MG PO TABS
30.0000 mg | ORAL_TABLET | Freq: Two times a day (BID) | ORAL | 3 refills | Status: AC
  Filled 2024-12-25 – 2025-01-11 (×2): qty 60, 30d supply, fill #0

## 2024-11-15 NOTE — Progress Notes (Unsigned)
 [facility]  Patient ID: Rebecca Roth,  MRN: [id number]  PCP: Vicci Odor, PA  Date of Evaluation: 11/15/2024  Time spent:25 minutes  Chief Complaint:  Chief Complaint   Anxiety; Depression; Follow-up    HISTORY/CURRENT STATUS: HPI  For routine med f/u  There was miscommunication at the [facility]. She is still taking Namenda  and stopped the Luvox , when started the Clomipramine.  I feel better than I have all year.  Energy and motivation are good.   No extreme sadness, tearfulness, or feelings of hopelessness.  Sleeps well most of the time. ADLs are nl.   No change in memory.  States that word-finding is easier lately.  Appetite has not changed.  Weight is stable.   Anxiety is well-controlled.  She rarely takes the Klonopin  but it is effective when needed.  Still has symptoms of OCD, especially counting, but it is tolerable.  No mania, delirium, AH/VH.  No SI/HI.  Individual Medical History/ Review of Systems: Changes? :No    Past medications for mental health diagnoses include: Cymbalta, Depakote, Effexor, Celexa Wellbutrin, Abilify, Zoloft wasn't helpful for depression or OCD, Lexapro?, Prozac  quit working, Buspar , Klonopin , Rexulti  was effective but too expensive. Risperdal  caused falls, Artane  caused Falls, seroquel  for sleep, Memantine  for OCD was ineffective.   Allergies: Artane  [trihexyphenidyl ], Risperdal  [risperidone ], Ace inhibitors, and Sulfa antibiotics  Current Medications:  Current Outpatient Medications:    aspirin EC 81 MG tablet, Take 81 mg by mouth daily. Swallow whole., Disp: , Rfl:    atorvastatin  (LIPITOR) 40 MG tablet, Take 1 tablet (40 mg total) by mouth daily., Disp: 30 tablet, Rfl: 2   BAYER CONTOUR TEST test strip, , Disp: , Rfl:    carvedilol (COREG) 6.25 MG tablet, Take 6.25 mg by mouth 2 (two) times daily., Disp: , Rfl:    clonazePAM  (KLONOPIN ) 1 MG tablet, Take 0.5 tablets (0.5 mg total) by mouth 3 (three) times daily as needed  for anxiety., Disp: 90 tablet, Rfl: 0   etodolac (LODINE) 400 MG tablet, TAKE 1 TABLET BY MOUTH TWICE A DAY WITH FOOD, Disp: 60 tablet, Rfl: 2   FARXIGA 10 MG TABS tablet, Take 10 mg by mouth daily., Disp: , Rfl:    fenofibrate 54 MG tablet, TAKE 1 TABLET BY MOUTH EVERY DAY, Disp: 30 tablet, Rfl: 2   furosemide (LASIX) 40 MG tablet, Take 40 mg by mouth daily., Disp: , Rfl:    Insulin Pen Needle (SURE COMFORT PEN NEEDLES) 31G X 5 MM MISC, Use as directed, Disp: 30 each, Rfl: 1   Lancet Devices (SIMPLE DIAGNOSTICS LANCING DEV) MISC, , Disp: , Rfl:    levocetirizine (XYZAL) 5 MG tablet, Take 5 mg by mouth every evening., Disp: , Rfl:    levothyroxine (SYNTHROID) 137 MCG tablet, Take 137 mcg by mouth daily., Disp: , Rfl:    montelukast (SINGULAIR) 10 MG tablet, TAKE 1 TABLET BY MOUTH EVERY DAY, Disp: 90 tablet, Rfl: 1   pantoprazole (PROTONIX) 40 MG tablet, TAKE 1 TABLET BY MOUTH TWICE A DAY, Disp: 60 tablet, Rfl: 2   spironolactone (ALDACTONE) 25 MG tablet, Take 25 mg by mouth daily., Disp: , Rfl:    SYNJARDY XR 25-1000 MG TB24, TAKE 1 TABLET BY MOUTH EVERY DAY IN THE MORNING, Disp: 30 tablet, Rfl: 2   TRESIBA FLEXTOUCH 100 UNIT/ML FlexTouch Pen, SMARTSIG:70 Unit(s) SUB-Q Twice Daily, Disp: , Rfl:    valsartan  (DIOVAN ) 80 MG tablet, Take 1 tablet (80 mg total) by mouth daily., Disp: 90 tablet,  Rfl: 0   Vitamin D, Ergocalciferol, (DRISDOL) 50000 UNITS CAPS capsule, Take 50,000 Units by mouth every Tuesday. At bedtime, Disp: , Rfl:    busPIRone  (BUSPAR ) 30 MG tablet, Take 1 tablet (30 mg total) by mouth 2 (two) times daily., Disp: 180 tablet, Rfl: 3   clomiPRAMINE (ANAFRANIL) 25 MG capsule, Take 1 capsule (25 mg total) by mouth at bedtime., Disp: 90 capsule, Rfl: 3   insulin glargine, 2 Unit Dial, (TOUJEO MAX SOLOSTAR) 300 UNIT/ML Solostar Pen, Inject 80 Units into the skin 2 (two) times daily. (Patient not taking: Reported on 10/11/2024), Disp: , Rfl:    lamoTRIgine  (LAMICTAL ) 200 MG tablet, Take 1  tablet (200 mg total) by mouth 2 (two) times daily., Disp: 180 tablet, Rfl: 3   memantine  (NAMENDA ) 5 MG tablet, Take 1 tablet (5 mg total) by mouth daily., Disp: 180 tablet, Rfl: 3 Medication Side Effects: weight gain and tremor  Family Medical/ Social History: Changes?   no  MENTAL HEALTH EXAM:  There were no vitals taken for this visit.There is no height or weight on file to calculate BMI.  General Appearance: Casual and Well Groomed  Eye Contact:  Good  Speech:  Clear and Coherent and Normal Rate  Volume:  Normal  Mood:  Euthymic  Affect:  Congruent  Thought Process:  Goal Directed and Descriptions of Associations: Circumstantial  Orientation:  Full (Time, Place, and Person)  Thought Content: Logical   Suicidal Thoughts:  No  Homicidal Thoughts:  No  Memory:  WNL  Judgement:  Good  Insight:  Good  Psychomotor Activity:  Normal  Concentration:  Concentration: Good and Attention Span: Good  Recall:  Good  Fund of Knowledge: Good  Language: Good  Assets:  Communication Skills Desire for Improvement Financial Resources/Insurance Housing Leisure Time Resilience Transportation  ADL's:  Intact  Cognition: WNL  Prognosis:  Good   PCP follows labs.  DIAGNOSES:    ICD-10-CM   1. Mixed obsessional thoughts and acts  F42.2     2. Recurrent major depressive disorder, in partial remission  F33.41     3. Generalized anxiety disorder  F41.1     4. Obstructive sleep apnea  G47.33       Receiving Psychotherapy: Yes   with Medford Fischer, [facility].  RECOMMENDATIONS:  PDMP reviewed.  Klonopin  last filled 12/10/2023.  Small amount of hydrocodone  filled 08/26/2024. I provided approximately 25 minutes of face to face time during this encounter, including time spent before and after the visit in records review, medical decision making, counseling pertinent to today's visit, and charting.   We discussed the miscommunication about the Luvox  and Namenda .  There is no reason to change  the regimen at this point since she is doing so well.  However we may need to restart Luvox  25 mg, even 1/2 pill may be helpful in combination with the clomipramine.  It made her very sleepy initially which was the reason for decreasing the dose.  Continue BuSpar  30 mg, 1 p.o. twice daily. Continue  clomipramine 25 mg, 1 p.o. nightly. Continue Klonopin  1 mg, 1/2 tid prn. (Or 1/2 during the day and 1 at bedtime.) Continue Lamictal   200 mg po bid.  Continue vitamin D, add multivitamin and B complex. Fish oil also. Continue CPAP use. Continue therapy with Medford Fischer, [facility]. Return in 3 months.    Verneita Cooks, PA-C

## 2024-11-16 ENCOUNTER — Encounter: Payer: Self-pay | Admitting: Physician Assistant

## 2024-12-04 DIAGNOSIS — Z1329 Encounter for screening for other suspected endocrine disorder: Secondary | ICD-10-CM | POA: Diagnosis not present

## 2024-12-04 DIAGNOSIS — Z13228 Encounter for screening for other metabolic disorders: Secondary | ICD-10-CM | POA: Diagnosis not present

## 2024-12-04 DIAGNOSIS — E782 Mixed hyperlipidemia: Secondary | ICD-10-CM | POA: Diagnosis not present

## 2024-12-04 DIAGNOSIS — K219 Gastro-esophageal reflux disease without esophagitis: Secondary | ICD-10-CM | POA: Diagnosis not present

## 2024-12-04 DIAGNOSIS — E039 Hypothyroidism, unspecified: Secondary | ICD-10-CM | POA: Diagnosis not present

## 2024-12-04 DIAGNOSIS — Z794 Long term (current) use of insulin: Secondary | ICD-10-CM | POA: Diagnosis not present

## 2024-12-04 DIAGNOSIS — F419 Anxiety disorder, unspecified: Secondary | ICD-10-CM | POA: Diagnosis not present

## 2024-12-04 DIAGNOSIS — N183 Chronic kidney disease, stage 3 unspecified: Secondary | ICD-10-CM | POA: Diagnosis not present

## 2024-12-04 DIAGNOSIS — F331 Major depressive disorder, recurrent, moderate: Secondary | ICD-10-CM | POA: Diagnosis not present

## 2024-12-12 ENCOUNTER — Telehealth: Payer: Self-pay | Admitting: Physician Assistant

## 2024-12-12 ENCOUNTER — Other Ambulatory Visit (HOSPITAL_COMMUNITY): Payer: Self-pay

## 2024-12-12 ENCOUNTER — Other Ambulatory Visit: Payer: Self-pay

## 2024-12-12 NOTE — Telephone Encounter (Signed)
 Rebecca Roth lvm stating that she is having isssues with her depression. She wants to know that if she can take 1/2 of capsule from the script she was last given. Please call her at [phone]

## 2024-12-12 NOTE — Telephone Encounter (Signed)
 What medication

## 2024-12-13 ENCOUNTER — Other Ambulatory Visit (HOSPITAL_COMMUNITY): Payer: Self-pay

## 2024-12-13 MED ORDER — EMPAGLIFLOZIN 25 MG PO TABS
25.0000 mg | ORAL_TABLET | Freq: Every day | ORAL | 0 refills | Status: AC
  Filled 2024-12-25: qty 30, 30d supply, fill #0
  Filled ????-??-??: fill #0

## 2024-12-13 MED ORDER — LEVOCETIRIZINE DIHYDROCHLORIDE 5 MG PO TABS
5.0000 mg | ORAL_TABLET | Freq: Every day | ORAL | 3 refills | Status: AC
  Filled 2024-12-25 – 2025-01-11 (×2): qty 30, 30d supply, fill #0

## 2024-12-13 MED ORDER — CARVEDILOL 12.5 MG PO TABS
12.5000 mg | ORAL_TABLET | Freq: Two times a day (BID) | ORAL | 0 refills | Status: AC
  Filled 2024-12-25 – 2025-01-11 (×2): qty 60, 30d supply, fill #0

## 2024-12-13 MED ORDER — FUROSEMIDE 40 MG PO TABS: 40.0000 mg | ORAL_TABLET | Freq: Every day | ORAL | 0 refills | Status: AC

## 2024-12-13 MED ORDER — PEN NEEDLES 31G X 8 MM MISC
2 refills | Status: AC
  Filled 2024-12-25: qty 200, 100d supply, fill #0

## 2024-12-13 MED ORDER — EMPAGLIFLOZIN 25 MG PO TABS
25.0000 mg | ORAL_TABLET | Freq: Every day | ORAL | 2 refills | Status: AC
  Filled 2024-12-25 – 2025-01-11 (×2): qty 30, 30d supply, fill #0

## 2024-12-13 MED ORDER — MONTELUKAST SODIUM 10 MG PO TABS
10.0000 mg | ORAL_TABLET | Freq: Every evening | ORAL | 1 refills | Status: AC
  Filled 2024-12-25 – 2025-01-11 (×2): qty 30, 30d supply, fill #0

## 2024-12-13 MED ORDER — PANTOPRAZOLE SODIUM 40 MG PO TBEC
40.0000 mg | DELAYED_RELEASE_TABLET | Freq: Two times a day (BID) | ORAL | 0 refills | Status: AC
  Filled 2024-12-25 – 2025-01-11 (×2): qty 60, 30d supply, fill #0

## 2024-12-13 MED ORDER — ETODOLAC 200 MG PO CAPS
200.0000 mg | ORAL_CAPSULE | Freq: Two times a day (BID) | ORAL | 0 refills | Status: AC
  Filled 2024-12-25 – 2025-01-11 (×2): qty 60, 30d supply, fill #0

## 2024-12-13 MED ORDER — FUROSEMIDE 40 MG PO TABS
40.0000 mg | ORAL_TABLET | Freq: Every day | ORAL | 0 refills | Status: AC
  Filled 2024-12-25 – 2025-01-11 (×2): qty 30, 30d supply, fill #0

## 2024-12-13 MED ORDER — ASPIRIN 81 MG PO TBEC
81.0000 mg | DELAYED_RELEASE_TABLET | Freq: Every day | ORAL | 1 refills | Status: AC
  Filled 2024-12-25: qty 90, 90d supply, fill #0
  Filled 2025-01-11: qty 30, 30d supply, fill #0

## 2024-12-13 MED ORDER — CHOLECALCIFEROL 1.25 MG (50000 UT) PO CAPS
ORAL_CAPSULE | ORAL | 1 refills | Status: AC
  Filled 2024-12-25: qty 3, 21d supply, fill #0

## 2024-12-13 MED ORDER — ACCU-CHEK GUIDE TEST VI STRP
ORAL_STRIP | 2 refills | Status: AC
  Filled 2024-12-25: qty 300, 100d supply, fill #0

## 2024-12-13 MED ORDER — CARVEDILOL 12.5 MG PO TABS: 12.5000 mg | ORAL_TABLET | Freq: Two times a day (BID) | ORAL | 0 refills | Status: AC

## 2024-12-13 MED ORDER — FENOFIBRATE 54 MG PO TABS
54.0000 mg | ORAL_TABLET | Freq: Every day | ORAL | 1 refills | Status: AC
  Filled 2024-12-25 – 2025-01-11 (×2): qty 30, 30d supply, fill #0

## 2024-12-13 MED ORDER — ATORVASTATIN CALCIUM 40 MG PO TABS
40.0000 mg | ORAL_TABLET | Freq: Every day | ORAL | 0 refills | Status: AC
  Filled 2024-12-25 – 2025-01-11 (×2): qty 30, 30d supply, fill #0

## 2024-12-13 MED ORDER — ACCRUFER 30 MG PO CAPS
30.0000 mg | ORAL_CAPSULE | Freq: Two times a day (BID) | ORAL | 1 refills | Status: AC
  Filled 2024-12-25: qty 60, 30d supply, fill #0

## 2024-12-13 MED ORDER — ACCU-CHEK SOFTCLIX LANCETS MISC: 2 refills | Status: AC

## 2024-12-13 NOTE — Telephone Encounter (Signed)
 Pt reporting that fluvoxamine  50 mg was discontinued at November visit. She said 1/24 is the one year anniversary of her husband's death and she is struggling, started at Thanksgiving. She reports she has medication, wants to know if she can she start it back.

## 2024-12-14 ENCOUNTER — Other Ambulatory Visit (HOSPITAL_COMMUNITY): Payer: Self-pay

## 2024-12-14 NOTE — Telephone Encounter (Signed)
 Pt called back and recommendations reviewed with her.

## 2024-12-14 NOTE — Telephone Encounter (Signed)
 Yes start back on the Luvox  50 mg, 1/2 q hs for 2 weeks then increase to 1 at bedtime. Continue all other meds as directed.

## 2024-12-14 NOTE — Telephone Encounter (Signed)
 LVM to [facility]

## 2024-12-15 ENCOUNTER — Other Ambulatory Visit (HOSPITAL_COMMUNITY): Payer: Self-pay

## 2024-12-17 ENCOUNTER — Other Ambulatory Visit: Payer: Self-pay | Admitting: Physician Assistant

## 2024-12-19 NOTE — Telephone Encounter (Signed)
 LVM to [facility], have a question regarding pharmacy.

## 2024-12-26 ENCOUNTER — Other Ambulatory Visit (HOSPITAL_COMMUNITY): Payer: Self-pay

## 2024-12-26 ENCOUNTER — Other Ambulatory Visit: Payer: Self-pay

## 2024-12-27 ENCOUNTER — Ambulatory Visit: Payer: Self-pay | Admitting: Pharmacist

## 2024-12-27 ENCOUNTER — Other Ambulatory Visit: Payer: Self-pay

## 2024-12-27 ENCOUNTER — Other Ambulatory Visit (HOSPITAL_COMMUNITY): Payer: Self-pay

## 2024-12-29 ENCOUNTER — Other Ambulatory Visit: Payer: Self-pay

## 2024-12-30 ENCOUNTER — Other Ambulatory Visit: Payer: Self-pay | Admitting: Physician Assistant

## 2025-01-01 ENCOUNTER — Other Ambulatory Visit (HOSPITAL_COMMUNITY): Payer: Self-pay

## 2025-01-01 MED ORDER — SPIRONOLACTONE 25 MG PO TABS
25.0000 mg | ORAL_TABLET | Freq: Every day | ORAL | 0 refills | Status: AC
  Filled 2025-01-01 – 2025-01-11 (×2): qty 90, 90d supply, fill #0

## 2025-01-01 MED ORDER — LEVOTHYROXINE SODIUM 137 MCG PO TABS
137.0000 ug | ORAL_TABLET | Freq: Every day | ORAL | 0 refills | Status: AC
  Filled 2025-01-01: qty 90, 90d supply, fill #0
  Filled 2025-01-11: qty 30, 30d supply, fill #0

## 2025-01-01 MED ORDER — FUROSEMIDE 40 MG PO TABS
40.0000 mg | ORAL_TABLET | Freq: Every day | ORAL | 2 refills | Status: AC
  Filled 2025-01-01: qty 90, 90d supply, fill #0

## 2025-01-01 MED ORDER — FAMOTIDINE 40 MG PO TABS
40.0000 mg | ORAL_TABLET | Freq: Every day | ORAL | 0 refills | Status: AC
  Filled 2025-01-01: qty 90, 90d supply, fill #0
  Filled 2025-01-11: qty 30, 30d supply, fill #0

## 2025-01-01 MED ORDER — VALSARTAN 80 MG PO TABS
80.0000 mg | ORAL_TABLET | Freq: Every day | ORAL | 0 refills | Status: AC
  Filled 2025-01-01: qty 90, 90d supply, fill #0
  Filled 2025-01-11: qty 30, 30d supply, fill #0

## 2025-01-01 MED ORDER — ATORVASTATIN CALCIUM 40 MG PO TABS
40.0000 mg | ORAL_TABLET | Freq: Every day | ORAL | 0 refills | Status: AC
  Filled 2025-01-01: qty 90, 90d supply, fill #0

## 2025-01-01 MED ORDER — LAMOTRIGINE 200 MG PO TABS
200.0000 mg | ORAL_TABLET | Freq: Two times a day (BID) | ORAL | 0 refills | Status: AC
  Filled 2025-01-01: qty 180, 90d supply, fill #0
  Filled 2025-01-11: qty 60, 30d supply, fill #0

## 2025-01-02 ENCOUNTER — Other Ambulatory Visit: Payer: Self-pay

## 2025-01-03 ENCOUNTER — Other Ambulatory Visit (HOSPITAL_COMMUNITY): Payer: Self-pay

## 2025-01-04 ENCOUNTER — Other Ambulatory Visit: Payer: Self-pay

## 2025-01-05 ENCOUNTER — Other Ambulatory Visit: Payer: Self-pay

## 2025-01-09 ENCOUNTER — Other Ambulatory Visit (HOSPITAL_COMMUNITY): Payer: Self-pay

## 2025-01-09 ENCOUNTER — Other Ambulatory Visit: Payer: Self-pay

## 2025-01-09 MED ORDER — LEVOTHYROXINE SODIUM 137 MCG PO TABS
137.0000 ug | ORAL_TABLET | Freq: Every day | ORAL | 1 refills | Status: AC
  Filled 2025-01-09: qty 90, 90d supply, fill #0

## 2025-01-09 MED ORDER — ETODOLAC 200 MG PO CAPS
200.0000 mg | ORAL_CAPSULE | Freq: Two times a day (BID) | ORAL | 1 refills | Status: AC
  Filled 2025-01-09: qty 180, 90d supply, fill #0

## 2025-01-09 MED ORDER — MONTELUKAST SODIUM 10 MG PO TABS
10.0000 mg | ORAL_TABLET | Freq: Every day | ORAL | 1 refills | Status: AC
  Filled 2025-01-09: qty 90, 90d supply, fill #0

## 2025-01-09 MED ORDER — VALSARTAN 80 MG PO TABS
80.0000 mg | ORAL_TABLET | Freq: Every day | ORAL | 1 refills | Status: AC
  Filled 2025-01-09: qty 90, 90d supply, fill #0

## 2025-01-09 MED ORDER — FAMOTIDINE 40 MG PO TABS
40.0000 mg | ORAL_TABLET | Freq: Every day | ORAL | 1 refills | Status: AC
  Filled 2025-01-09: qty 90, 90d supply, fill #0

## 2025-01-09 MED ORDER — ATORVASTATIN CALCIUM 40 MG PO TABS
40.0000 mg | ORAL_TABLET | Freq: Every day | ORAL | 1 refills | Status: AC
  Filled 2025-01-09: qty 90, 90d supply, fill #0

## 2025-01-09 MED ORDER — FUROSEMIDE 40 MG PO TABS
40.0000 mg | ORAL_TABLET | Freq: Every day | ORAL | 1 refills | Status: AC
  Filled 2025-01-09: qty 90, 90d supply, fill #0

## 2025-01-09 MED ORDER — FERROUS SULFATE 325 (65 FE) MG PO TABS
325.0000 mg | ORAL_TABLET | Freq: Every day | ORAL | 1 refills | Status: AC
  Filled 2025-01-09: qty 90, 90d supply, fill #0

## 2025-01-09 MED ORDER — ASPIRIN 81 MG PO TBEC
81.0000 mg | DELAYED_RELEASE_TABLET | Freq: Every day | ORAL | 1 refills | Status: AC
  Filled 2025-01-09: qty 90, 90d supply, fill #0

## 2025-01-09 MED ORDER — CARVEDILOL 12.5 MG PO TABS
12.5000 mg | ORAL_TABLET | Freq: Two times a day (BID) | ORAL | 1 refills | Status: AC
  Filled 2025-01-09: qty 180, 90d supply, fill #0

## 2025-01-09 MED ORDER — FENOFIBRATE 54 MG PO TABS
54.0000 mg | ORAL_TABLET | Freq: Every day | ORAL | 1 refills | Status: AC
  Filled 2025-01-09: qty 90, 90d supply, fill #0

## 2025-01-09 MED ORDER — SPIRONOLACTONE 25 MG PO TABS
25.0000 mg | ORAL_TABLET | Freq: Every day | ORAL | 1 refills | Status: AC
  Filled 2025-01-09: qty 90, 90d supply, fill #0

## 2025-01-09 MED ORDER — JARDIANCE 25 MG PO TABS
25.0000 mg | ORAL_TABLET | Freq: Every day | ORAL | 1 refills | Status: AC
  Filled 2025-01-09: qty 90, 90d supply, fill #0

## 2025-01-09 MED ORDER — LEVOCETIRIZINE DIHYDROCHLORIDE 5 MG PO TABS
5.0000 mg | ORAL_TABLET | Freq: Every day | ORAL | 1 refills | Status: AC
  Filled 2025-01-09: qty 90, 90d supply, fill #0

## 2025-01-09 MED ORDER — VITAMIN D (ERGOCALCIFEROL) 50000 UNITS PO CAPS
50000.0000 [IU] | ORAL_CAPSULE | ORAL | 1 refills | Status: AC
  Filled 2025-01-09: qty 12, 84d supply, fill #0

## 2025-01-10 ENCOUNTER — Other Ambulatory Visit: Payer: Self-pay

## 2025-01-11 ENCOUNTER — Other Ambulatory Visit: Payer: Self-pay

## 2025-01-11 ENCOUNTER — Other Ambulatory Visit (HOSPITAL_COMMUNITY): Payer: Self-pay

## 2025-01-11 ENCOUNTER — Other Ambulatory Visit (HOSPITAL_BASED_OUTPATIENT_CLINIC_OR_DEPARTMENT_OTHER): Payer: Self-pay

## 2025-01-12 ENCOUNTER — Other Ambulatory Visit: Payer: Self-pay

## 2025-01-15 ENCOUNTER — Other Ambulatory Visit: Payer: Self-pay

## 2025-01-15 ENCOUNTER — Other Ambulatory Visit (HOSPITAL_COMMUNITY): Payer: Self-pay

## 2025-01-16 ENCOUNTER — Other Ambulatory Visit: Payer: Self-pay

## 2025-01-18 ENCOUNTER — Other Ambulatory Visit (HOSPITAL_BASED_OUTPATIENT_CLINIC_OR_DEPARTMENT_OTHER): Payer: Self-pay

## 2025-01-18 ENCOUNTER — Other Ambulatory Visit: Payer: Self-pay

## 2025-02-14 ENCOUNTER — Ambulatory Visit: Admitting: Physician Assistant
